# Patient Record
Sex: Female | Born: 1950 | Race: White | Hispanic: No | Marital: Married | Smoking: Never smoker
Health system: Southern US, Community
[De-identification: ages and names within clinical notes are randomized; demographics above are authoritative.]

## PROBLEM LIST (undated history)

## (undated) DIAGNOSIS — J411 Mucopurulent chronic bronchitis: Secondary | ICD-10-CM

## (undated) DIAGNOSIS — I213 ST elevation (STEMI) myocardial infarction of unspecified site: Secondary | ICD-10-CM

## (undated) DIAGNOSIS — E079 Disorder of thyroid, unspecified: Secondary | ICD-10-CM

## (undated) DIAGNOSIS — I2510 Atherosclerotic heart disease of native coronary artery without angina pectoris: Secondary | ICD-10-CM

## (undated) DIAGNOSIS — E785 Hyperlipidemia, unspecified: Secondary | ICD-10-CM

## (undated) DIAGNOSIS — R7303 Prediabetes: Secondary | ICD-10-CM

## (undated) DIAGNOSIS — E2740 Unspecified adrenocortical insufficiency: Secondary | ICD-10-CM

## (undated) HISTORY — DX: Mucopurulent chronic bronchitis: J41.1

## (undated) HISTORY — DX: Atherosclerotic heart disease of native coronary artery without angina pectoris: I25.10

---

## 2002-09-21 ENCOUNTER — Encounter: Payer: Self-pay | Admitting: General Practice

## 2002-09-21 ENCOUNTER — Encounter: Admission: RE | Admit: 2002-09-21 | Discharge: 2002-09-21 | Payer: Self-pay | Admitting: General Practice

## 2011-05-08 ENCOUNTER — Other Ambulatory Visit: Payer: Self-pay | Admitting: Obstetrics and Gynecology

## 2011-05-08 DIAGNOSIS — R928 Other abnormal and inconclusive findings on diagnostic imaging of breast: Secondary | ICD-10-CM

## 2011-05-15 ENCOUNTER — Ambulatory Visit
Admission: RE | Admit: 2011-05-15 | Discharge: 2011-05-15 | Disposition: A | Discharge status: Home / Self Care | Payer: BC Managed Care – PPO | Source: Ambulatory Visit | Attending: Obstetrics and Gynecology | Admitting: Obstetrics and Gynecology

## 2011-05-15 DIAGNOSIS — R928 Other abnormal and inconclusive findings on diagnostic imaging of breast: Secondary | ICD-10-CM

## 2011-11-13 DIAGNOSIS — R000 Tachycardia, unspecified: Secondary | ICD-10-CM

## 2013-04-15 DIAGNOSIS — G4733 Obstructive sleep apnea (adult) (pediatric): Secondary | ICD-10-CM

## 2013-04-15 HISTORY — DX: Obstructive sleep apnea (adult) (pediatric): G47.33

## 2015-09-03 ENCOUNTER — Encounter (HOSPITAL_COMMUNITY): Payer: Self-pay | Admitting: Emergency Medicine

## 2015-09-03 ENCOUNTER — Emergency Department (HOSPITAL_COMMUNITY)
Admission: EM | Admit: 2015-09-03 | Discharge: 2015-09-03 | Disposition: A | Discharge status: Home / Self Care | Payer: Medicare Other | Attending: Emergency Medicine | Admitting: Emergency Medicine

## 2015-09-03 ENCOUNTER — Emergency Department (HOSPITAL_COMMUNITY): Payer: Medicare Other

## 2015-09-03 DIAGNOSIS — J40 Bronchitis, not specified as acute or chronic: Secondary | ICD-10-CM | POA: Diagnosis not present

## 2015-09-03 DIAGNOSIS — R0602 Shortness of breath: Secondary | ICD-10-CM | POA: Diagnosis present

## 2015-09-03 DIAGNOSIS — Z79899 Other long term (current) drug therapy: Secondary | ICD-10-CM | POA: Diagnosis not present

## 2015-09-03 HISTORY — DX: Unspecified adrenocortical insufficiency: E27.40

## 2015-09-03 LAB — CBC
HCT: 40.4 % (ref 36.0–46.0)
HEMOGLOBIN: 13.8 g/dL (ref 12.0–15.0)
MCH: 30.1 pg (ref 26.0–34.0)
MCHC: 34.2 g/dL (ref 30.0–36.0)
MCV: 88 fL (ref 78.0–100.0)
PLATELETS: 258 10*3/uL (ref 150–400)
RBC: 4.59 MIL/uL (ref 3.87–5.11)
RDW: 13.2 % (ref 11.5–15.5)
WBC: 8.2 10*3/uL (ref 4.0–10.5)

## 2015-09-03 LAB — TROPONIN I

## 2015-09-03 LAB — COMPREHENSIVE METABOLIC PANEL
ALBUMIN: 4.4 g/dL (ref 3.5–5.0)
ALT: 34 U/L (ref 14–54)
ANION GAP: 7 (ref 5–15)
AST: 36 U/L (ref 15–41)
Alkaline Phosphatase: 68 U/L (ref 38–126)
BUN: 12 mg/dL (ref 6–20)
CO2: 23 mmol/L (ref 22–32)
Calcium: 9.5 mg/dL (ref 8.9–10.3)
Chloride: 107 mmol/L (ref 101–111)
Creatinine, Ser: 0.68 mg/dL (ref 0.44–1.00)
GFR calc non Af Amer: >60 mL/min (ref >60)
GLUCOSE: 132 mg/dL — AB (ref 65–99)
POTASSIUM: 3.7 mmol/L (ref 3.5–5.1)
SODIUM: 137 mmol/L (ref 135–145)
Total Bilirubin: 0.5 mg/dL (ref 0.3–1.2)
Total Protein: 7.8 g/dL (ref 6.5–8.1)

## 2015-09-03 LAB — DIFFERENTIAL
BASOS ABS: 0.1 10*3/uL (ref 0.0–0.1)
Basophils Relative: 1 %
Eosinophils Absolute: 0.2 10*3/uL (ref 0.0–0.7)
Eosinophils Relative: 2 %
LYMPHS PCT: 27 %
Lymphs Abs: 2.3 10*3/uL (ref 0.7–4.0)
MONO ABS: 0.9 10*3/uL (ref 0.1–1.0)
MONOS PCT: 11 %
NEUTROS ABS: 5 10*3/uL (ref 1.7–7.7)
Neutrophils Relative %: 59 %

## 2015-09-03 MED ORDER — ALBUTEROL SULFATE (2.5 MG/3ML) 0.083% IN NEBU
2.5000 mg | INHALATION_SOLUTION | Freq: Once | RESPIRATORY_TRACT | Status: AC
  Administered 2015-09-03: 2.5 mg via RESPIRATORY_TRACT
  Filled 2015-09-03: qty 3

## 2015-09-03 MED ORDER — IPRATROPIUM-ALBUTEROL 0.5-2.5 (3) MG/3ML IN SOLN
3.0000 mL | Freq: Once | RESPIRATORY_TRACT | Status: AC
  Administered 2015-09-03: 3 mL via RESPIRATORY_TRACT
  Filled 2015-09-03: qty 3

## 2015-09-03 NOTE — ED Notes (Signed)
Pt states she has not been feeling well for about 3 weeks.  States they went on vacation and after entering the room she found she was unable to breathe and inhaler was not working.  Has seen pcp since then, but still having symptoms.  States really got bad on Friday.  Was tx for URI with augmentin and mucinex 3 weeks ago.

## 2015-09-03 NOTE — ED Provider Notes (Signed)
CSN: [id number]     Arrival date & time 09/03/15  4098 History   First MD Initiated Contact with Patient 09/03/15 [phone]     Chief Complaint  Patient presents with  . Shortness of Breath     (Consider location/radiation/quality/duration/timing/severity/associated sxs/prior Treatment) HPI   Monica Harrison is a 65 y.o. female with hx of adrenal insufficiency , presents to the Emergency Department complaining of shortness of breath and generalized malaise for 3 weeks.  She states her symptoms began after [place] believes [place].  She reports persistent, intermittent coughing since them associated with generalized malaise.  She has been seen by her PMD and currently taking augmentin without relief.  Cough is non-productive.  She denies fever, vomiting, dysuria, abdominal pain.  Using albuterol nebs at home without relief.     Past Medical History  Diagnosis Date  . Adrenal insufficiency (HCC)    History reviewed. No pertinent past surgical history. History reviewed. No pertinent family history. Social History  Substance Use Topics  . Smoking status: Never Smoker   . Smokeless tobacco: None  . Alcohol Use: No   OB History    No data available     Review of Systems  Constitutional: Positive for fatigue. Negative for fever, chills and appetite change.  HENT: Positive for congestion. Negative for sore throat and trouble swallowing.   Respiratory: Positive for cough, chest tightness and shortness of breath. Negative for wheezing.   Cardiovascular: Negative for chest pain.  Gastrointestinal: Negative for nausea, vomiting and abdominal pain.  Genitourinary: Negative for dysuria.  Musculoskeletal: Negative for arthralgias.  Skin: Negative for rash.  Neurological: Negative for dizziness, weakness and numbness.  Hematological: Negative for adenopathy.  All other systems reviewed and are negative.     Allergies  Avelox and Biaxin  Home  Medications   Prior to Admission medications   Medication Sig Start Date End Date Taking? Authorizing Provider  ALPRAZolam Prudy Feeler) 0.25 MG tablet Take 0.25 mg by mouth at bedtime as needed for anxiety.   Yes Historical Provider, MD  hydrocortisone (CORTEF) 5 MG tablet Take 2.5-10 mg by mouth See admin instructions. TAKE 10 MG IN THE MORNING AND 2.5 MG IN THE AFTERNOON.   Yes Historical Provider, MD  loratadine (CLARITIN) 10 MG tablet Take 10 mg by mouth daily as needed for allergies.   Yes Historical Provider, MD  PRESCRIPTION MEDICATION Apply 1 application topically 3 (three) times a week. COMPOUNDED TESTOSTERONE CREAM - [facility].   Yes Historical Provider, MD  PRESCRIPTION MEDICATION Apply 1 application topically at bedtime. COMPOUNDED PROGESTERONE CREAM - [facility].   Yes Historical Provider, MD  thyroid (NATURE-THROID) 32.5 MG tablet Take 32.5 mg by mouth daily after lunch.   Yes Historical Provider, MD  Thyroid (NATURE-THROID) 48.75 MG TABS Take 1 tablet by mouth daily.   Yes Historical Provider, MD  ALREX 0.2 % SUSP Place 1 drop into both eyes daily as needed. 08/18/15   Historical Provider, MD  amoxicillin-clavulanate (AUGMENTIN) 875-125 MG tablet Take 1 tablet by mouth 2 (two) times daily. Starting 08/18/2015 x 20 days - treated for upper respiratory infection/sinuses. 08/18/15   Historical Provider, MD  fluconazole (DIFLUCAN) 150 MG tablet Take 1 tablet by mouth daily. Starting 08/18/2015 x 20 days. 08/18/15   Historical Provider, MD   BP 148/83 mmHg  Pulse 87  Temp(Src) 97.6 F (36.4 C) (Oral)  Resp 18  Ht  (1.6 m)  Wt 82.555 kg  BMI 32.25 kg/m2  SpO2 98% Physical Exam  Constitutional: She is oriented to person, place, and time. She appears well-developed and well-nourished. No distress.  HENT:  Head: Normocephalic and atraumatic.  Right Ear: Tympanic membrane and ear canal normal.  Left Ear: Tympanic membrane and ear canal normal.  Mouth/Throat: Uvula is midline,  oropharynx is clear and moist and mucous membranes are normal. No oropharyngeal exudate.  Eyes: EOM are normal. Pupils are equal, round, and reactive to light.  Neck: Normal range of motion, full passive range of motion without pain and phonation normal. Neck supple.  Cardiovascular: Normal rate, regular rhythm, normal heart sounds and intact distal pulses.   No murmur heard. Pulmonary/Chest: Effort normal and breath sounds normal. No stridor. No respiratory distress. She has no wheezes. She has no rales. She exhibits no tenderness.  Abdominal: Soft. She exhibits no distension. There is no tenderness. There is no rebound.  Musculoskeletal: She exhibits no edema.  Lymphadenopathy:    She has no cervical adenopathy.  Neurological: She is alert and oriented to person, place, and time. She exhibits normal muscle tone. Coordination normal.  Skin: Skin is warm and dry.  Psychiatric: She has a normal mood and affect.  Nursing note and vitals reviewed.   ED Course  Procedures (including critical care time) Labs Review Labs Reviewed  COMPREHENSIVE METABOLIC PANEL - Abnormal; Notable for the following:    Glucose, Bld 132 (*)    All other components within normal limits  CBC  DIFFERENTIAL  TROPONIN I    Imaging Review Dg Chest 2 View  09/03/2015  CLINICAL DATA:  Shortness of breath. EXAM: CHEST  2 VIEW COMPARISON:  None. FINDINGS: Normal heart size. Mildly tortuous atherosclerotic thoracic aorta. Otherwise normal mediastinal contour. No pneumothorax. No pleural effusion. No pulmonary edema. Mild scarring versus atelectasis at the left lung base. No consolidative airspace disease. IMPRESSION: Mild scarring versus atelectasis at the left lung base. Otherwise no active disease in the chest. Electronically Signed   By: Delbert PhenixJason A Poff M.D.   On: 09/03/2015 10:27   I have personally reviewed and evaluated these images and lab results as part of my medical decision-making.   EKG  Interpretation   Date/Time:  Sunday Sep 03 2015 09:37:21 EDT Ventricular Rate:  91 PR Interval:  151 QRS Duration: 100 QT Interval:  369 QTC Calculation: 454 R Axis:   -19 Text Interpretation:  Sinus rhythm Borderline left axis deviation Low  voltage, extremity and precordial leads No old tracing to compare  Confirmed by Uc RegentsWENTZ  MD, ELLIOTT ([id number]) on 09/03/2015 9:42:38 AM      MDM   Final diagnoses:  Bronchitis    Pt well appearing, non-toxic.  Currently taking Augmentin prescribed from her PMD.  Has appt on Monday with her pulmonologist.  Labs, EKG reassuring.    Pt also seen by Dr. Effie ShyWentz and care plan discussed with includes continue abx, inhaler, and she agrees to increase her hydrocortisone until pulm f/u. Advised to ER return if needed    Pauline Ausammy Pernella Ackerley, Cordelia Poche-C 09/05/15 2152  Mancel BaleElliott Wentz, MD 09/06/15 [id number]

## 2015-09-03 NOTE — ED Provider Notes (Signed)
  Face-to-face evaluation   History: She complains of malaise, an episode of shortness of breath, and feeling bad for several weeks. She is currently being treated with Augmentin, and is apparently on her second round of that. Her primary care doctor also increased her hydrocortisone for 4 days, about 10 days ago and that helped. She has a follow-up appointment with her pulmonologist tomorrow and with her endocrinologist in 2 days time   Physical exam: Alert, calm, cooperative. Able to speak in full sentences without distress. Lungs very minimally decreased air movement bilaterally without audible wheezes, Rales or rhonchi.  MDM- bronchitis without evidence for infection. No reason for admission or aggressive emergency department treatment at this time. She may benefit from a short-term increase of her steroid medication. She has follow-up with pulmonary tomorrow who can monitor her treatment.    Medical screening examination/treatment/procedure(s) were conducted as a shared visit with non-physician practitioner(s) and myself.  I personally evaluated the patient during the encounter  Mancel BaleElliott Kazuo Durnil, MD 09/06/15 1232

## 2015-09-14 DIAGNOSIS — J15212 Pneumonia due to Methicillin resistant Staphylococcus aureus: Secondary | ICD-10-CM

## 2015-09-14 HISTORY — DX: Pneumonia due to methicillin resistant Staphylococcus aureus: J15.212

## 2016-03-09 ENCOUNTER — Encounter (HOSPITAL_COMMUNITY): Payer: Self-pay | Admitting: *Deleted

## 2016-03-09 ENCOUNTER — Emergency Department (HOSPITAL_COMMUNITY): Payer: Medicare Other

## 2016-03-09 ENCOUNTER — Emergency Department (HOSPITAL_COMMUNITY)
Admission: EM | Admit: 2016-03-09 | Discharge: 2016-03-09 | Disposition: A | Discharge status: Home / Self Care | Payer: Medicare Other | Attending: Emergency Medicine | Admitting: Emergency Medicine

## 2016-03-09 DIAGNOSIS — R1030 Lower abdominal pain, unspecified: Secondary | ICD-10-CM | POA: Diagnosis present

## 2016-03-09 DIAGNOSIS — Z79899 Other long term (current) drug therapy: Secondary | ICD-10-CM | POA: Diagnosis not present

## 2016-03-09 DIAGNOSIS — K5732 Diverticulitis of large intestine without perforation or abscess without bleeding: Secondary | ICD-10-CM | POA: Diagnosis not present

## 2016-03-09 HISTORY — DX: Disorder of thyroid, unspecified: E07.9

## 2016-03-09 LAB — URINALYSIS, ROUTINE W REFLEX MICROSCOPIC
Bilirubin Urine: NEGATIVE
GLUCOSE, UA: NEGATIVE mg/dL
Hgb urine dipstick: NEGATIVE
KETONES UR: NEGATIVE mg/dL
LEUKOCYTES UA: NEGATIVE
NITRITE: NEGATIVE
PH: 7.5 (ref 5.0–8.0)
PROTEIN: NEGATIVE mg/dL
Specific Gravity, Urine: 1.01 (ref 1.005–1.030)

## 2016-03-09 LAB — CBC
HEMATOCRIT: 37.8 % (ref 36.0–46.0)
Hemoglobin: 13 g/dL (ref 12.0–15.0)
MCH: 30.6 pg (ref 26.0–34.0)
MCHC: 34.4 g/dL (ref 30.0–36.0)
MCV: 88.9 fL (ref 78.0–100.0)
PLATELETS: 241 10*3/uL (ref 150–400)
RBC: 4.25 MIL/uL (ref 3.87–5.11)
RDW: 12.9 % (ref 11.5–15.5)
WBC: 11.8 10*3/uL — AB (ref 4.0–10.5)

## 2016-03-09 LAB — COMPREHENSIVE METABOLIC PANEL
ALT: 27 U/L (ref 14–54)
AST: 32 U/L (ref 15–41)
Albumin: 4 g/dL (ref 3.5–5.0)
Alkaline Phosphatase: 61 U/L (ref 38–126)
Anion gap: 9 (ref 5–15)
BILIRUBIN TOTAL: 0.4 mg/dL (ref 0.3–1.2)
BUN: 17 mg/dL (ref 6–20)
CHLORIDE: 103 mmol/L (ref 101–111)
CO2: 25 mmol/L (ref 22–32)
Calcium: 9.7 mg/dL (ref 8.9–10.3)
Creatinine, Ser: 0.84 mg/dL (ref 0.44–1.00)
Glucose, Bld: 142 mg/dL — ABNORMAL HIGH (ref 65–99)
POTASSIUM: 3.7 mmol/L (ref 3.5–5.1)
Sodium: 137 mmol/L (ref 135–145)
TOTAL PROTEIN: 7.2 g/dL (ref 6.5–8.1)

## 2016-03-09 LAB — LIPASE, BLOOD: LIPASE: 18 U/L (ref 11–51)

## 2016-03-09 MED ORDER — AMOXICILLIN-POT CLAVULANATE 875-125 MG PO TABS: 1.0000 | ORAL_TABLET | Freq: Two times a day (BID) | ORAL | 0 refills | Status: DC

## 2016-03-09 MED ORDER — DEXTROSE 5 % IV SOLN
1.0000 g | Freq: Once | INTRAVENOUS | Status: AC
  Administered 2016-03-09: 1 g via INTRAVENOUS
  Filled 2016-03-09: qty 10

## 2016-03-09 MED ORDER — METRONIDAZOLE 500 MG PO TABS
500.0000 mg | ORAL_TABLET | Freq: Once | ORAL | Status: AC
  Administered 2016-03-09: 500 mg via ORAL
  Filled 2016-03-09: qty 1

## 2016-03-09 MED ORDER — IOPAMIDOL (ISOVUE-300) INJECTION 61%
100.0000 mL | Freq: Once | INTRAVENOUS | Status: AC | PRN
  Administered 2016-03-09: 100 mL via INTRAVENOUS

## 2016-03-09 NOTE — ED Notes (Signed)
Patient transported to CT 

## 2016-03-09 NOTE — ED Triage Notes (Signed)
Pt comes in for right sided flank pain that moves into her lower abdomen. This started on Wednesday. Pt states she has diarrhea, denies any n/v. Pt went to [facility] urgent care and was tested for a uti which was negative. She was sent here for r/o kidney stone.

## 2016-03-09 NOTE — ED Provider Notes (Signed)
[facility]-EMERGENCY DEPT Provider Note   CSN: [id number] Arrival date & time: 03/09/16  1308     History   Chief Complaint Chief Complaint  Patient presents with  . Flank Pain    HPI Monica Harrison is a 65 y.o. female.  HPI  The patient is a 64 year old female, she presents to the hospital with a complaint of lower abdominal pain which started several days ago. She reports that this pain is rather constant, it is worse at times, she thinks it is worse when she is walking or moving around, better when she is lying still, it does not come on sporadically. It does not come on randomly. She reports that there is no sharp or stabbing pain, she has some urinary frequency but no blood in the urine, no dysuria, she denies any fevers chills nausea or vomiting. She was seen at the urgent care prior to arrival, urinalysis was unremarkable so she was sent to the emergency department for evaluation for kidney stones. She has never had a kidney stone. She does report a history of a slightly prolapsed bladder but his never had any abdominal surgery. She also reports having some constipation, pitting and some magnesium oxide to try to move her bowels and reports that she has had several loose bowel movements but no blood and no watery stools. Having the stools does not make her feel better.  Past Medical History:  Diagnosis Date  . Adrenal insufficiency (HCC)   . Thyroid disease     There are no active problems to display for this patient.   History reviewed. No pertinent surgical history.  OB History    No data available       Home Medications    Prior to Admission medications   Medication Sig Start Date End Date Taking? Authorizing Provider  albuterol (PROVENTIL HFA;VENTOLIN HFA) 108 (90 Base) MCG/ACT inhaler Inhale 1-2 puffs into the lungs every 6 (six) hours as needed for wheezing or shortness of breath.   Yes Historical Provider, MD  albuterol (PROVENTIL) (2.5 MG/3ML) 0.083% nebulizer  solution Take 2.5 mg by nebulization every 6 (six) hours as needed for wheezing or shortness of breath.   Yes Historical Provider, MD  ALPRAZolam (XANAX) 0.25 MG tablet Take 0.25 mg by mouth at bedtime.    Yes Historical Provider, MD  B Complex Vitamins (B COMPLEX 50 PO) Take 1 tablet by mouth 3 (three) times a week.   Yes Historical Provider, MD  calcium-vitamin D (OSCAL WITH D) 500-200 MG-UNIT tablet Take 2 tablets by mouth daily with breakfast.   Yes Historical Provider, MD  Cholecalciferol (VITAMIN D3) 5000 units CAPS Take 1 capsule by mouth daily.   Yes Historical Provider, MD  Chromium-Cinnamon 100-500 MCG-MG CAPS Take 1 capsule by mouth daily.   Yes Historical Provider, MD  Coenzyme Q10 (CO Q 10) 100 MG CAPS Take 1 capsule by mouth daily.   Yes Historical Provider, MD  Digestive Enzymes (ENZYME DIGEST) CAPS Take 2 capsules by mouth daily.   Yes Historical Provider, MD  fluorometholone (FML) 0.1 % ophthalmic suspension Place 1 drop into both eyes 2 (two) times daily. 03/01/16  Yes Historical Provider, MD  hydrocortisone (CORTEF) 5 MG tablet Take 5-10 mg by mouth See admin instructions. TAKE 10 MG IN THE MORNING AND 5 MG IN THE AFTERNOON.   Yes Historical Provider, MD  Magnesium 500 MG TABS Take 1 tablet by mouth daily.   Yes Historical Provider, MD  Multiple Vitamin (MULTIVITAMIN WITH MINERALS) TABS tablet  Take 1 tablet by mouth daily.   Yes Historical Provider, MD  Probiotic Product (PROBIOTIC PO) Take 1 capsule by mouth daily.   Yes Historical Provider, MD  thyroid (NATURE-THROID) 32.5 MG tablet Take 32.5 mg by mouth daily after lunch.   Yes Historical Provider, MD  Thyroid (NATURE-THROID) 48.75 MG TABS Take 1 tablet by mouth daily.   Yes Historical Provider, MD  vitamin C (ASCORBIC ACID) 500 MG tablet Take 1,000 mg by mouth 4 (four) times daily.   Yes Historical Provider, MD  amoxicillin-clavulanate (AUGMENTIN) 875-125 MG tablet Take 1 tablet by mouth every 12 (twelve) hours. 03/09/16    Eber HongBrian Julus Kelley, MD  PRESCRIPTION MEDICATION Apply 1 application topically 2 (two) times a week. COMPOUNDED TESTOSTERONE CREAM - [facility].     Historical Provider, MD  PRESCRIPTION MEDICATION Apply 1 application topically at bedtime. COMPOUNDED PROGESTERONE CREAM - [facility].    Historical Provider, MD    Family History No family history on file.  Social History Social History  Substance Use Topics  . Smoking status: Never Smoker  . Smokeless tobacco: Never Used  . Alcohol use No     Allergies   Avelox [moxifloxacin hcl in nacl]; Biaxin [clarithromycin]; and Sulfa antibiotics   Review of Systems Review of Systems  All other systems reviewed and are negative.    Physical Exam Updated Vital Signs BP 106/56 (BP Location: Right Arm)   Pulse 77   Temp 98.4 F (36.9 C) (Oral)   Resp 16   Ht 5\' 3"  (1.6 m)   Wt 176 lb 3.2 oz (79.9 kg)   SpO2 96%   BMI 31.21 kg/m   Physical Exam  Constitutional: She appears well-developed and well-nourished. No distress.  HENT:  Head: Normocephalic and atraumatic.  Mouth/Throat: Oropharynx is clear and moist. No oropharyngeal exudate.  Eyes: Conjunctivae and EOM are normal. Pupils are equal, round, and reactive to light. Right eye exhibits no discharge. Left eye exhibits no discharge. No scleral icterus.  Neck: Normal range of motion. Neck supple. No JVD present. No thyromegaly present.  Cardiovascular: Normal rate, regular rhythm, normal heart sounds and intact distal pulses.  Exam reveals no gallop and no friction rub.   No murmur heard. Pulmonary/Chest: Effort normal and breath sounds normal. No respiratory distress. She has no wheezes. She has no rales.  Abdominal: Soft. Bowel sounds are normal. She exhibits no distension and no mass. There is tenderness ( Though the patient's abdomen is totally soft and benign she does express tenderness when I push in the suprapubic and left lower quadrants, there is no guarding or masses).    Musculoskeletal: Normal range of motion. She exhibits no edema or tenderness.  Lymphadenopathy:    She has no cervical adenopathy.  Neurological: She is alert. Coordination normal.  Skin: Skin is warm and dry. No rash noted. No erythema.  Psychiatric: She has a normal mood and affect. Her behavior is normal.  Nursing note and vitals reviewed.    ED Treatments / Results  Labs (all labs ordered are listed, but only abnormal results are displayed) Labs Reviewed  COMPREHENSIVE METABOLIC PANEL - Abnormal; Notable for the following:       Result Value   Glucose, Bld 142 (*)    All other components within normal limits  CBC - Abnormal; Notable for the following:    WBC 11.8 (*)    All other components within normal limits  LIPASE, BLOOD  URINALYSIS, ROUTINE W REFLEX MICROSCOPIC (NOT AT [facility])  EKG  EKG Interpretation None       Radiology Ct Abdomen Pelvis W Contrast  Result Date: 03/09/2016 CLINICAL DATA:  Right flank pain radiating into lower abdominal pain.x 3 days. Worse today. EXAM: CT ABDOMEN AND PELVIS WITH CONTRAST TECHNIQUE: Multidetector CT imaging of the abdomen and pelvis was performed using the standard protocol following bolus administration of intravenous contrast. CONTRAST:  100mL ISOVUE-300 IOPAMIDOL (ISOVUE-300) INJECTION 61% COMPARISON:  None. FINDINGS: Lower chest: No acute findings.  Small hiatal hernia. Hepatobiliary: 1.1 cm stone in the gallbladder neck region. Gallbladder otherwise unremarkable without evidence of acute cholecystitis. Liver appears normal. No bile duct dilatation appreciated. Pancreas: Unremarkable. No pancreatic ductal dilatation or surrounding inflammatory changes. Evaluation slightly limited by patient motion artifact. Spleen: Normal in size without focal abnormality. Adrenals/Urinary Tract: Adrenal glands appear normal. Small hypodense focus within the anterior cortex of the mid right kidney, too small to definitively characterize but most  likely a small cyst. Kidneys otherwise unremarkable without suspicious mass, stone or hydronephrosis. No perinephric inflammation. No ureteral or bladder calculi identified. Bladder appears normal. Stomach/Bowel: Fairly extensive diverticulosis within the sigmoid and descending colon. Focal inflammatory thickening of the colonic walls at the junction of the sigmoid colon and descending colon, with associated pericolonic inflammation, indicating acute diverticulitis. No large or small bowel dilatation. Small hiatal hernia. Appendix is not convincingly seen but there are no inflammatory changes about the cecum to suggest acute appendicitis. Vascular/Lymphatic: Scattered mild atherosclerotic changes of the normal caliber abdominal aorta. No acute vascular abnormality. No enlarged lymph nodes seen within the abdomen or pelvis. Reproductive: Unremarkable. Other: No abscess collection seen.  No free intraperitoneal air. Musculoskeletal: Degenerative changes throughout the slightly scoliotic thoracolumbar spine, mild to moderate in degree. Chronic bilateral pars interarticularis defects at the L5-S1 level with associated grade 1 spondylolisthesis. No acute appearing osseous abnormality. Superficial soft tissues are unremarkable. Incidental note made of a small umbilical hernia which contains fat only. IMPRESSION: 1. Acute diverticulitis at the junction of the sigmoid and descending colon, with associated bowel wall thickening and pericolonic inflammation. No complicating features. No abscess collection seen. No evidence of bowel perforation. No associated bowel obstruction. 2. Cholelithiasis without evidence of acute cholecystitis. 3. Small hiatal hernia. 4. Aortic atherosclerosis. 5. Chronic bilateral pars interarticularis defects at L5-S1 with associated grade 1 spondylolisthesis. No acute appearing osseous abnormality. Electronically Signed   By: Bary RichardStan  Maynard M.D.   On: 03/09/2016 17:32    Procedures Procedures  (including critical care time)  Medications Ordered in ED Medications  iopamidol (ISOVUE-300) 61 % injection 100 mL (100 mLs Intravenous Contrast Given 03/09/16 1643)  cefTRIAXone (ROCEPHIN) 1 g in dextrose 5 % 50 mL IVPB (1 g Intravenous New Bag/Given 03/09/16 1810)  metroNIDAZOLE (FLAGYL) tablet 500 mg (500 mg Oral Given 03/09/16 1810)     Initial Impression / Assessment and Plan / ED Course  I have reviewed the triage vital signs and the nursing notes.  Pertinent labs & imaging results that were available during my care of the patient were reviewed by me and considered in my medical decision making (see chart for details).  Clinical Course     Other than the abdominal tenderness the patient's exam is benign, she has a leukocytosis of 11,800, there is no other abnormal findings including a clean urinalysis and normal kidney function. We'll proceed with CT scan for further evaluation of possible abdominal pathologic process.  CT shows diverticulitis - uncomplicated - pt informed Given meds prior to d/c  home on  augmentin given cipro allergy Pt in agreement  Vitals:   03/09/16 1318 03/09/16 1751  BP: 121/66 106/56  Pulse: 99 77  Resp: 16 16  Temp: 98.4 F (36.9 C)     Final Clinical Impressions(s) / ED Diagnoses   Final diagnoses:  Diverticulitis of large intestine without perforation or abscess without bleeding    New Prescriptions New Prescriptions   AMOXICILLIN-CLAVULANATE (AUGMENTIN) 875-125 MG TABLET    Take 1 tablet by mouth every 12 (twelve) hours.     Eber Hong, MD 03/09/16 [phone]

## 2016-03-09 NOTE — Discharge Instructions (Signed)

## 2016-04-23 ENCOUNTER — Encounter (INDEPENDENT_AMBULATORY_CARE_PROVIDER_SITE_OTHER): Payer: Self-pay | Admitting: *Deleted

## 2018-04-21 ENCOUNTER — Ambulatory Visit (INDEPENDENT_AMBULATORY_CARE_PROVIDER_SITE_OTHER): Payer: Self-pay

## 2018-04-21 DIAGNOSIS — Z1211 Encounter for screening for malignant neoplasm of colon: Secondary | ICD-10-CM

## 2018-04-21 MED ORDER — NA SULFATE-K SULFATE-MG SULF 17.5-3.13-1.6 GM/177ML PO SOLN: 1.0000 | ORAL | 0 refills | Status: DC

## 2018-04-21 NOTE — Patient Instructions (Addendum)
Monica Harrison  16-Jul-1950 MRN: [id number]     Procedure Date: 12/29/2018 Time to register: 9:30am Place to register: [facility] Procedure Time: 10:30am Scheduled provider: R. Garfield Cornea, MD    PREPARATION FOR COLONOSCOPY WITH SUPREP BOWEL PREP KIT  Note: Suprep Bowel Prep Kit is a split-dose (2day) regimen. Consumption of BOTH 6-ounce bottles is required for a complete prep.  Please notify us immediately if you are diabetic, take iron supplements, or if you are on Coumadin or any other blood thinners.                                                                                                                                                   2 DAYS BEFORE PROCEDURE:  DATE: 12/27/2018 DAY: Sunday Begin clear liquid diet AFTER your lunch meal. NO SOLID FOODS after this point.  1 DAY BEFORE PROCEDURE:  DATE: 12/28/2018  DAY: Monday Continue clear liquids the entire day - NO SOLID FOOD.   At 6:00pm: Complete steps 1 through 4 below, using ONE (1) 6-ounce bottle, before going to bed. Step 1:  Pour ONE (1) 6-ounce bottle of SUPREP liquid into the mixing container.  Step 2:  Add cool drinking water to the 16 ounce line on the container and mix.  Note: Dilute the solution concentrate as directed prior to use. Step 3:  DRINK ALL the liquid in the container. Step 4:  You MUST drink an additional two (2) or more 16 ounce containers of water over the next one (1) hour.   Continue clear liquids.  DAY OF PROCEDURE:   DATE: 12/29/2018   DAY: Tuesday If you take medications for your heart, blood pressure, or breathing, you may take these medications.    5 hours before your procedure at :5:30am Step 1:  Pour ONE (1) 6-ounce bottle of SUPREP liquid into the mixing container.  Step 2:  Add cool drinking water to the 16 ounce line on the container and mix.  Note: Dilute the solution concentrate as directed prior to use. Step 3:  DRINK ALL the liquid in the container. Step 4:  You  MUST drink an additional two (2) or more 16 ounce containers of water over the next one (1) hour. You MUST complete the final glass of water at least 3 hours before your colonoscopy.   Nothing by mouth past 7:30am  You may take your morning medications with sip of water unless we have instructed otherwise.    Please see below for Dietary Information.  CLEAR LIQUIDS INCLUDE:  Water Jello (NOT red in color)   Ice Popsicles (NOT red in color)   Tea (sugar ok, no milk/cream) Powdered fruit flavored drinks  Coffee (sugar ok, no milk/cream) Gatorade/ Lemonade/ Kool-Aid  (NOT red in color)   Juice: apple, white grape, white cranberry Soft drinks  Clear bullion,  consomme, broth (fat free beef/chicken/vegetable)  Carbonated beverages (any kind)  Strained chicken noodle soup Hard Candy   Remember: Clear liquids are liquids that will allow you to see your fingers on the other side of a clear glass. Be sure liquids are NOT red in color, and not cloudy, but CLEAR.  DO NOT EAT OR DRINK ANY OF THE FOLLOWING:  Dairy products of any kind   Cranberry juice Tomato juice / V8 juice   Grapefruit juice Orange juice     Red grape juice  Do not eat any solid foods, including such foods as: cereal, oatmeal, yogurt, fruits, vegetables, creamed soups, eggs, bread, crackers, pureed foods in a blender, etc.   HELPFUL HINTS FOR DRINKING PREP SOLUTION:   Make sure prep is extremely cold. Mix and refrigerate the the morning of the prep. You may also put in the freezer.   You may try mixing some Crystal Light or Country Time Lemonade if you prefer. Mix in small amounts; add more if necessary.  Try drinking through a straw  Rinse mouth with water or a mouthwash between glasses, to remove after-taste.  Try sipping on a cold beverage /ice/ popsicles between glasses of prep.  Place a piece of sugar-free hard candy in mouth between glasses.  If you become nauseated, try consuming smaller amounts, or stretch out  the time between glasses. Stop for 30-60 minutes, then slowly start back drinking.     OTHER INSTRUCTIONS  You will need a responsible adult at least 69 years of age to accompany you and drive you home. This person must remain in the waiting room during your procedure. The hospital will cancel your procedure if you do not have a responsible adult with you.   1. Wear loose fitting clothing that is easily removed. 2. Leave jewelry and other valuables at home.  3. Remove all body piercing jewelry and leave at home. 4. Total time from sign-in until discharge is approximately 2-3 hours. 5. You should go home directly after your procedure and rest. You can resume normal activities the day after your procedure. 6. The day of your procedure you should not:  Drive  Make legal decisions  Operate machinery  Drink alcohol  Return to work   You may call the office (Dept: [phone]) before 5:00pm, or page the doctor on call [phone]) after 5:00pm, for further instructions, if necessary.   Insurance Information YOU WILL NEED TO CHECK WITH YOUR INSURANCE COMPANY FOR THE BENEFITS OF COVERAGE YOU HAVE FOR THIS PROCEDURE.  UNFORTUNATELY, NOT ALL INSURANCE COMPANIES HAVE BENEFITS TO COVER ALL OR PART OF THESE TYPES OF PROCEDURES.  IT IS YOUR RESPONSIBILITY TO CHECK YOUR BENEFITS, HOWEVER, WE WILL BE GLAD TO ASSIST YOU WITH ANY CODES YOUR INSURANCE COMPANY MAY NEED.    PLEASE NOTE THAT MOST INSURANCE COMPANIES WILL NOT COVER A SCREENING COLONOSCOPY FOR PEOPLE UNDER THE AGE OF 50  IF YOU HAVE BCBS INSURANCE, YOU MAY HAVE BENEFITS FOR A SCREENING COLONOSCOPY BUT IF POLYPS ARE FOUND THE DIAGNOSIS WILL CHANGE AND THEN YOU MAY HAVE A DEDUCTIBLE THAT WILL NEED TO BE MET. SO PLEASE MAKE SURE YOU CHECK YOUR BENEFITS FOR A SCREENING COLONOSCOPY AS WELL AS A DIAGNOSTIC COLONOSCOPY.

## 2018-04-21 NOTE — Progress Notes (Signed)
Gastroenterology Pre-Procedure Review  Request Date:04/21/18 Requesting Physician: Dr.Oneil no previous tcs  PATIENT REVIEW QUESTIONS: The patient responded to the following health history questions as indicated:    1. Diabetes Melitis: no 2. Joint replacements in the past 12 months: no 3. Major health problems in the past 3 months: no 4. Has an artificial valve or MVP: no 5. Has a defibrillator: no 6. Has been advised in past to take antibiotics in advance of a procedure like teeth cleaning: no 7. Family history of colon cancer: no  8. Alcohol Use: no 9. History of sleep apnea: no  10. History of coronary artery or other vascular stents placed within the last 12 months: no 11. History of any prior anesthesia complications: no    MEDICATIONS & ALLERGIES:    Patient reports the following regarding taking any blood thinners:   Plavix? no Aspirin? no Coumadin? no Brilinta? no Xarelto? no Eliquis? no Pradaxa? no Savaysa? no Effient? no  Patient confirms/reports the following medications:  Current Outpatient Medications  Medication Sig Dispense Refill  . albuterol (PROVENTIL HFA;VENTOLIN HFA) 108 (90 Base) MCG/ACT inhaler Inhale 1-2 puffs into the lungs every 6 (six) hours as needed for wheezing or shortness of breath.    Marland Kitchen. albuterol (PROVENTIL) (2.5 MG/3ML) 0.083% nebulizer solution Take 2.5 mg by nebulization every 6 (six) hours as needed for wheezing or shortness of breath.    . ALPRAZolam (XANAX) 0.25 MG tablet Take 0.25 mg by mouth at bedtime.     . B Complex Vitamins (B COMPLEX 50 PO) Take 1 tablet by mouth 3 (three) times a week.    . calcium-vitamin D (OSCAL WITH D) 500-200 MG-UNIT tablet Take 2 tablets by mouth daily with breakfast.    . Cholecalciferol (VITAMIN D3) 5000 units CAPS Take 1 capsule by mouth daily.    . Chromium-Cinnamon 100-500 MCG-MG CAPS Take 1 capsule by mouth daily.    . Coenzyme Q10 (CO Q 10) 100 MG CAPS Take 1 capsule by mouth daily.    . Digestive  Enzymes (ENZYME DIGEST) CAPS Take 2 capsules by mouth daily.    Marland Kitchen. ESTRADIOL-PROGESTERONE PO Take 1 mg by mouth. Takes 1/2 tablet    . hydrocortisone (CORTEF) 5 MG tablet Take 5-10 mg by mouth See admin instructions. TAKE 10 MG IN THE MORNING AND 5 MG IN THE AFTERNOON.    . Magnesium 500 MG TABS Take 1 tablet by mouth daily.    . Multiple Vitamin (MULTIVITAMIN WITH MINERALS) TABS tablet Take 1 tablet by mouth daily.    Bertram Gala. Polyethyl Glycol-Propyl Glycol (SYSTANE) 0.4-0.3 % SOLN Apply to eye daily. As needed    . PRESCRIPTION MEDICATION Apply 1 application topically 2 (two) times a week. COMPOUNDED TESTOSTERONE CREAM - [facility].     Marland Kitchen. PRESCRIPTION MEDICATION Apply 1 application topically at bedtime. COMPOUNDED PROGESTERONE CREAM - [facility].    . Probiotic Product (PROBIOTIC PO) Take 1 capsule by mouth daily.    Marland Kitchen. thyroid (NATURE-THROID) 32.5 MG tablet Take 32.5 mg by mouth daily after lunch.    . Thyroid (NATURE-THROID) 48.75 MG TABS Take 1 tablet by mouth daily.    Marland Kitchen. UNABLE TO FIND Med Name: live Immune gummies 3 daily    . vitamin A 1610910000 UNIT capsule Take 10,000 Units by mouth daily.    . vitamin C (ASCORBIC ACID) 500 MG tablet Take 1,000 mg by mouth 4 (four) times daily.     No current facility-administered medications for this visit.  Patient confirms/reports the following allergies:  Allergies  Allergen Reactions  . Avelox [Moxifloxacin Hcl In Nacl] Anaphylaxis  . Biaxin [Clarithromycin] Anaphylaxis  . Sulfa Antibiotics Other (See Comments)    Fatigue     No orders of the defined types were placed in this encounter.   AUTHORIZATION INFORMATION Primary Insurance: medicare,  ID #: [id number] Pre-Cert / Auth required: no  SCHEDULE INFORMATION: Procedure has been scheduled as follows:  Date:07/01/18 , Time: 12:00 Location: [facility] Dr.Rourk  This Gastroenterology Pre-Precedure Review Form is being routed to the following provider(s): Tana Coast, PA

## 2018-04-25 NOTE — Progress Notes (Signed)
Ok to schedule.

## 2018-06-24 ENCOUNTER — Telehealth: Payer: Self-pay | Admitting: Internal Medicine

## 2018-06-24 NOTE — Telephone Encounter (Signed)
Tried to call the pt- NA-LM

## 2018-06-24 NOTE — Telephone Encounter (Signed)
[phone] PLEASE CALL PATIENT ABOUT HER MEDICATION AND HER PROCEDURE [phone] CELL

## 2018-06-25 ENCOUNTER — Telehealth: Payer: Self-pay | Admitting: Gastroenterology

## 2018-06-25 NOTE — Telephone Encounter (Signed)
Pt was returning a call from yesterday from JL. [phone]

## 2018-06-25 NOTE — Progress Notes (Signed)
Pt called to rs.  See phone note from 06/25/2018.

## 2018-06-25 NOTE — Telephone Encounter (Signed)
Angie spoke with the pt. See other phone note.

## 2018-06-25 NOTE — Telephone Encounter (Signed)
Pt has sinus infection and is on Augmentin.  Pt has no fever but is not feeling well and wanted to reschedule her colonoscopy.  RSC to 09/16/2018.  New instructions mailed to pt.  Called and informed Endo.

## 2018-09-03 NOTE — Progress Notes (Signed)
Pt requested to re-schedule colonoscopy on 09/16/2018.  She is having eye surgery.  Pt is aware that we will call her back to re-schedule once August procedure schedules have been released.

## 2018-09-14 NOTE — Addendum Note (Signed)
Addended by: Noreene Larsson on: 09/14/2018 08:38 AM   Modules accepted: Orders, SmartSet

## 2018-09-14 NOTE — Progress Notes (Signed)
Pt re-scheduled to 12/01/2018.  Pt aware that we are mailing out new prep instructions.  New orders placed in Epic.

## 2018-09-16 ENCOUNTER — Ambulatory Visit (HOSPITAL_COMMUNITY): Admission: RE | Admit: 2018-09-16 | Payer: Medicare Other | Source: Home / Self Care | Admitting: Internal Medicine

## 2018-09-16 ENCOUNTER — Encounter (HOSPITAL_COMMUNITY): Admission: RE | Payer: Self-pay | Source: Home / Self Care

## 2018-09-16 SURGERY — COLONOSCOPY
Anesthesia: Moderate Sedation

## 2018-10-13 ENCOUNTER — Telehealth: Payer: Self-pay | Admitting: *Deleted

## 2018-10-13 NOTE — Telephone Encounter (Signed)
Called pt to schedule COVID 19 screening.  Pt decided to re-schedule her procedure to 12/29/2018.  She scheduled it out to Sept because she said that  her ENT doctor told her to stay away from the hospital for anything right now due to her adrenal glands.  Pt aware that we will mail out new prep instructions.  Pt aware of COVID 19 screening date on 12/25/2018.  Endo notified.

## 2018-10-13 NOTE — Addendum Note (Signed)
Addended by: Metro Kung on: 10/13/2018 04:05 PM   Modules accepted: Miquel Dunn

## 2018-11-14 DIAGNOSIS — U071 COVID-19: Secondary | ICD-10-CM

## 2018-11-14 DIAGNOSIS — J1282 Pneumonia due to coronavirus disease 2019: Secondary | ICD-10-CM

## 2018-11-14 HISTORY — DX: Pneumonia due to coronavirus disease 2019: J12.82

## 2018-11-14 HISTORY — DX: COVID-19: U07.1

## 2018-12-24 ENCOUNTER — Telehealth: Payer: Self-pay | Admitting: *Deleted

## 2018-12-24 NOTE — Telephone Encounter (Signed)
Pt called in and requested to cancel her procedure.  She did not want to reschedule.  She said that Dr. Wynona Meals has advised her not to have the procedure done right now due to her health and immune system.  She is aware to have her doctor to send a referral to [place] when she is cleared to have it done.  Pt voiced understanding.

## 2018-12-25 ENCOUNTER — Other Ambulatory Visit (HOSPITAL_COMMUNITY)
Admission: RE | Admit: 2018-12-25 | Discharge: 2018-12-25 | Disposition: A | Discharge status: Home / Self Care | Payer: Medicare Other | Source: Ambulatory Visit | Attending: Internal Medicine | Admitting: Internal Medicine

## 2018-12-29 ENCOUNTER — Encounter (HOSPITAL_COMMUNITY): Admission: RE | Payer: Self-pay | Source: Home / Self Care

## 2018-12-29 ENCOUNTER — Ambulatory Visit (HOSPITAL_COMMUNITY): Admission: RE | Admit: 2018-12-29 | Payer: Medicare Other | Source: Home / Self Care | Admitting: Internal Medicine

## 2018-12-29 SURGERY — COLONOSCOPY
Anesthesia: Moderate Sedation

## 2018-12-29 NOTE — Telephone Encounter (Signed)
Noted. Was screening exam. Await for future referral.

## 2020-05-03 ENCOUNTER — Encounter: Payer: Self-pay | Admitting: Pulmonary Disease

## 2020-05-03 ENCOUNTER — Ambulatory Visit (INDEPENDENT_AMBULATORY_CARE_PROVIDER_SITE_OTHER): Payer: Medicare Other

## 2020-05-03 ENCOUNTER — Ambulatory Visit (INDEPENDENT_AMBULATORY_CARE_PROVIDER_SITE_OTHER): Payer: Medicare Other | Admitting: Pulmonary Disease

## 2020-05-03 ENCOUNTER — Other Ambulatory Visit: Payer: Self-pay

## 2020-05-03 VITALS — BP 134/76 | HR 91 | Temp 97.3°F | Ht 63.0 in | Wt 184.4 lb

## 2020-05-03 DIAGNOSIS — R0600 Dyspnea, unspecified: Secondary | ICD-10-CM

## 2020-05-03 NOTE — Progress Notes (Signed)
Nikiyah Fackler    [id number]    02-04-1951  Primary Care Physician:Desai, Vito Berger, MD  Referring Physician: Kennieth Rad, MD [facility] [address] [place] [address]  Chief complaint: Consult for dyspnea  HPI: 70 year old with history of allergies, bronchitis, adrenal insufficiency [treated in [place]], Lyme's disease, angioedema Previously followed by Dr. Audie Box in [place] who has retired.  She has history of recurrent bronchitis, developed MRSA, E. coli pneumonia diagnosed with bronchoscopy in June 2017.  This was treated as an outpatient Has occasional seasonal allergies and is on albuterol which she uses occasionally. Has occasional snoring, especially when she is fatigued.  She was tested with a sleep apnea around 2015 and was told that she had mild obstructive events only when she was lying supine.  Currently not on treatment with CPAP  Pets: Dog Occupation: Operates a Medical sales representative shop Exposures: No mold, hot tub, Jacuzzi.  No feather pillows or comforter Smoking history: Never smoker Travel history: No significant travel history Relevant family history: No significant family history of lung disease  Outpatient Encounter Medications as of 05/03/2020  Medication Sig  . albuterol (PROVENTIL HFA;VENTOLIN HFA) 108 (90 Base) MCG/ACT inhaler Inhale 2 puffs into the lungs every 6 (six) hours as needed for wheezing or shortness of breath.   Marland Kitchen albuterol (PROVENTIL) (2.5 MG/3ML) 0.083% nebulizer solution Take 2.5 mg by nebulization every 6 (six) hours as needed for wheezing or shortness of breath.  . ALPRAZolam (XANAX) 0.25 MG tablet Take 0.25 mg by mouth at bedtime.  . Ascorbic Acid (VITAMIN C) 1000 MG tablet Take 2,000 mg by mouth 2 (two) times daily.  . B Complex Vitamins (B COMPLEX 50 PO) Take 1 tablet by mouth daily.   . benzonatate (TESSALON) 200 MG capsule benzonatate 200 mg capsule  . budesonide (RHINOCORT AQUA) 32 MCG/ACT nasal  spray Place 1 spray into both nostrils daily as needed for rhinitis.  . Calcium-Magnesium-Zinc (CAL-MAG-ZINC PO) Take 2 tablets by mouth at bedtime.  . Cholecalciferol (VITAMIN D3) 5000 units CAPS Take 5,000 Units by mouth at bedtime.   . Coenzyme Q10 (CO Q 10) 100 MG CAPS Take 100 mg by mouth every evening.   . Digestive Enzymes (ENZYME DIGEST) CAPS Take 1 capsule by mouth 2 (two) times daily with a meal.   . estradiol (ESTRACE) 1 MG tablet Take 0.5 mg by mouth every other day.  . hydrocortisone (CORTEF) 5 MG tablet Take 5-15 mg by mouth See admin instructions. TAKE 15 MG IN THE MORNING AND 5 MG IN THE AFTERNOON.  Marland Kitchen ibuprofen (ADVIL,MOTRIN) 200 MG tablet Take 400 mg by mouth every 6 (six) hours as needed for moderate pain.  . Magnesium 400 MG TABS Take 400 mg by mouth at bedtime.  . Misc Natural Products (IMMUNE FORMULA PO) Take 1 tablet by mouth 4 (four) times a week.  . Multiple Vitamin (MULTIVITAMIN WITH MINERALS) TABS tablet Take 3 tablets by mouth daily.   Marland Kitchen nystatin (MYCOSTATIN) 500000 units TABS tablet Take 500,000 Units by mouth at bedtime.  . Omega-3 Fatty Acids (OMEGA 3 PO) Take 2 capsules by mouth 2 (two) times daily.  Vladimir Faster Glycol-Propyl Glycol 0.4-0.3 % SOLN Place 1 drop into both eyes 4 (four) times daily.   Marland Kitchen PRESCRIPTION MEDICATION Apply 1 Pump topically at bedtime. COMPOUNDED PROGESTERONE CREAM - [facility].  . Probiotic Product (PROBIOTIC PO) Take 1 capsule by mouth every evening.   . thyroid (ARMOUR) 32.5 MG tablet  Take 32.5 mg by mouth daily at 2 PM.   . Thyroid 48.75 MG TABS Take 48.75 tablets by mouth daily.   . vitamin A 10000 UNIT capsule Take 10,000 Units by mouth every evening.   Marland Kitchen PRESCRIPTION MEDICATION Apply 1 application topically 2 (two) times a week. COMPOUNDED TESTOSTERONE CREAM - [facility].  Monday and Fridays (Patient not taking: Reported on 05/03/2020)  . [DISCONTINUED] amoxicillin-clavulanate (AUGMENTIN) 875-125 MG tablet Take 1 tablet by mouth 2  (two) times daily.  . [DISCONTINUED] Na Sulfate-K Sulfate-Mg Sulf (SUPREP BOWEL PREP KIT) 17.5-3.13-1.6 GM/177ML SOLN Take 1 kit by mouth as directed.   No facility-administered encounter medications on file as of 05/03/2020.    Allergies as of 05/03/2020 - Review Complete 05/03/2020  Allergen Reaction Noted  . Avelox [moxifloxacin hcl in nacl] Anaphylaxis 09/03/2015  . Biaxin [clarithromycin] Anaphylaxis 09/03/2015  . Lactose intolerance (gi)  06/24/2018  . Sulfa antibiotics Other (See Comments) 03/09/2016  . Tobramycin  06/24/2018  . Yeast-related products Swelling 06/24/2018    Past Medical History:  Diagnosis Date  . Adrenal insufficiency ([place])   . Thyroid disease     No past surgical history on file.  No family history on file.  Social History   Socioeconomic History  . Marital status: Married    Spouse name: Not on file  . Number of children: Not on file  . Years of education: Not on file  . Highest education level: Not on file  Occupational History  . Not on file  Tobacco Use  . Smoking status: Never Smoker  . Smokeless tobacco: Never Used  Substance and Sexual Activity  . Alcohol use: No  . Drug use: No  . Sexual activity: Not on file  Other Topics Concern  . Not on file  Social History Narrative  . Not on file   Social Determinants of Health   Financial Resource Strain: Not on file  Food Insecurity: Not on file  Transportation Needs: Not on file  Physical Activity: Not on file  Stress: Not on file  Social Connections: Not on file  Intimate Partner Violence: Not on file    Review of systems: Review of Systems  Constitutional: Negative for fever and chills.  HENT: Negative.   Eyes: Negative for blurred vision.  Respiratory: as per HPI  Cardiovascular: Negative for chest pain and palpitations.  Gastrointestinal: Negative for vomiting, diarrhea, blood per rectum. Genitourinary: Negative for dysuria, urgency, frequency and hematuria.   Musculoskeletal: Negative for myalgias, back pain and joint pain.  Skin: Negative for itching and rash.  Neurological: Negative for dizziness, tremors, focal weakness, seizures and loss of consciousness.  Endo/Heme/Allergies: Negative for environmental allergies.  Psychiatric/Behavioral: Negative for depression, suicidal ideas and hallucinations.  All other systems reviewed and are negative.  Physical Exam: Blood pressure 134/76, pulse 91, temperature (!) 97.3 F (36.3 C), temperature source Skin, height '5\' 3"'  (1.6 m), weight 184 lb 6.4 oz (83.6 kg), SpO2 99 %. Gen:      No acute distress HEENT:  EOMI, sclera anicteric Neck:     No masses; no thyromegaly Lungs:    Clear to auscultation bilaterally; normal respiratory effort CV:         Regular rate and rhythm; no murmurs Abd:      + bowel sounds; soft, non-tender; no palpable masses, no distension Ext:    No edema; adequate peripheral perfusion Skin:      Warm and dry; no rash Neuro: alert and oriented x 3 Psych: normal  mood and affect  Data Reviewed: Imaging: Chest x-ray 09/03/2015- mild atelectasis at the left base.  I have reviewed the images personally.  PFTs: 09/26/2015 FVC 3.40 [116%], FEV1 2.64 [125%], F/F 78 Normal spirometry  Labs:  Assessment:  Bronchitis She has a history of asthma, COPD on record but no objective evidence of obstruction on PFTs Occasionally needs to use albuterol inhaler, not on controller medication  Chest x-ray today, PFTs for baseline assessment  Suspected sleep apnea Has snoring and daytime somnolence and fatigue. Discussed home sleep study but she like to hold off for now as she is planning on getting some dental procedures done Reassess in 3 months.  Plan/Recommendations: Chest x-ray, PFTs  Marshell Garfinkel MD [facility] 05/03/2020, 10:46 AM  CC: Kennieth Rad, MD

## 2020-05-03 NOTE — Patient Instructions (Signed)
We are glad to establish care with you to continue pulmonary follow up in [place].  I have reviewed your records and will continue the albuterol as needed Schedule chest x-ray today and PFTs Follow-up in 3 months when we can discuss if you want to proceed with the sleep study.

## 2020-05-08 ENCOUNTER — Ambulatory Visit: Payer: Medicare Other

## 2020-05-08 ENCOUNTER — Other Ambulatory Visit: Payer: Self-pay | Admitting: *Deleted

## 2020-05-08 DIAGNOSIS — R0600 Dyspnea, unspecified: Secondary | ICD-10-CM

## 2020-05-16 ENCOUNTER — Ambulatory Visit (INDEPENDENT_AMBULATORY_CARE_PROVIDER_SITE_OTHER): Payer: Medicare Other

## 2020-05-16 DIAGNOSIS — R0600 Dyspnea, unspecified: Secondary | ICD-10-CM | POA: Diagnosis not present

## 2020-05-22 ENCOUNTER — Telehealth: Payer: Self-pay | Admitting: Pulmonary Disease

## 2020-05-22 DIAGNOSIS — R9389 Abnormal findings on diagnostic imaging of other specified body structures: Secondary | ICD-10-CM

## 2020-05-22 NOTE — Telephone Encounter (Signed)
ATC patient but she did not answer. VM is not setup. Will try again later.

## 2020-05-22 NOTE — Telephone Encounter (Signed)
Her chest x ray results from 2/1 did not come to my inbox for some reason  Please let her know that they continue to show opacities at the base of the lung. Advise her to get COVID tested.  Order z pack and high resolution CT in 2 weels for follow up imaging.

## 2020-05-24 MED ORDER — AMOXICILLIN-POT CLAVULANATE 875-125 MG PO TABS: 1.0000 | ORAL_TABLET | Freq: Two times a day (BID) | ORAL | 0 refills | Status: DC

## 2020-05-24 MED ORDER — FLUCONAZOLE 100 MG PO TABS: 100.0000 mg | ORAL_TABLET | Freq: Every day | ORAL | 0 refills | Status: DC

## 2020-05-24 NOTE — Telephone Encounter (Signed)
Okay to send in Augmentin 875 mg twice daily for 7 days Send in Diflucan 100 mg once daily for 7 days  Let her know that the opacity is at both bases more on the right.  Since this is a persistent issue order follow-up high-resolution CT in 4 weeks for follow-up.  Chilton Greathouse MD [facility] See Amion for pager  If no response to pager , please call [phone] until 7pm After 7:00 pm call Elink  [phone] 05/24/2020, 2:21 PM

## 2020-05-24 NOTE — Telephone Encounter (Signed)
Spoke with patient. She is aware of Dr. Shirlee More recommendations. Will go ahead and place the order for the Augmentin, Diflucan and CT scan.   Nothing further needed at time of call.

## 2020-05-24 NOTE — Telephone Encounter (Signed)
Pt stated that she is not able to take zpak.  She said that the only abx that she can take is augmentin. Pt is wanting to know if this is on the right side? She stated that Dr. Flora Harrison saw something on her cxr back in November but said he was not concerned about this.   Please advise. Thanks   Pt stated that she will also need diflucan is she is to take the abx.   Pt also stated that she has not had any symptoms of covid.

## 2020-05-25 ENCOUNTER — Other Ambulatory Visit: Payer: Medicare Other

## 2020-05-25 DIAGNOSIS — Z20822 Contact with and (suspected) exposure to covid-19: Secondary | ICD-10-CM

## 2020-05-26 LAB — SARS-COV-2, NAA 2 DAY TAT

## 2020-05-26 LAB — NOVEL CORONAVIRUS, NAA: SARS-CoV-2, NAA: NOT DETECTED

## 2020-06-21 ENCOUNTER — Other Ambulatory Visit: Payer: Self-pay

## 2020-06-21 ENCOUNTER — Ambulatory Visit (HOSPITAL_COMMUNITY)
Admission: RE | Admit: 2020-06-21 | Discharge: 2020-06-21 | Disposition: A | Discharge status: Home / Self Care | Payer: Medicare Other | Source: Ambulatory Visit | Attending: Pulmonary Disease | Admitting: Pulmonary Disease

## 2020-06-21 DIAGNOSIS — R9389 Abnormal findings on diagnostic imaging of other specified body structures: Secondary | ICD-10-CM | POA: Insufficient documentation

## 2020-06-23 ENCOUNTER — Telehealth: Payer: Self-pay | Admitting: Pulmonary Disease

## 2020-06-23 NOTE — Telephone Encounter (Signed)
Please let her know that CT yesterday does not show any pneumonia.  I do not think she will require antibiotics Please advise her to get tested for Covid Advise saline nasal rinse, over-the-counter steroid nasal spray

## 2020-06-23 NOTE — Telephone Encounter (Signed)
Spoke with the pt  She states started to have frequent throat clearing 2-3 days ago  Today her sinuses feel congested and she has started to cough up minimal yellow sputum  She states she feels like she may need abx before this gets worse  No wheezing, increased SOB, f/c/s, aches, HA, sore throat  She states does best with augmentin, and if any abx are given she requests diflucan to be sent as well  These were both prescribed by [place] on 05/24/20 and she completed- not having any symptoms then- given based on cxr  Note that she had HRCT completed on 06/21/20 Please advise thanks!  *has not been vaccinated against covid- states due to her adrenal problem and multiple med    allergies which I listed below  Allergies  Allergen Reactions  . Avelox [Moxifloxacin Hcl In Nacl] Anaphylaxis  . Biaxin [Clarithromycin] Anaphylaxis  . Lactose Intolerance (Gi)     Bloating, gas, congestion  . Sulfa Antibiotics Other (See Comments)    Fatigue   . Tobramycin     Severe headaches   . Yeast-Related Products Swelling    Tongue swelling Annamary Carolin

## 2020-06-23 NOTE — Telephone Encounter (Signed)
Called and spoke with patient to let her know of Dr. Daneil Dan recs. Advised patient to continue with nasal rinse and spray and advised her she can do 1200 mg of mucinex instead of 600 and see if that helps and if she feels worse or starts coughing up mucus with color to call and let us know. Patient expressed understanding. Nothing further needed at this time.

## 2020-06-26 ENCOUNTER — Telehealth: Payer: Self-pay | Admitting: Pulmonary Disease

## 2020-06-26 MED ORDER — AMOXICILLIN-POT CLAVULANATE 875-125 MG PO TABS: 1.0000 | ORAL_TABLET | Freq: Two times a day (BID) | ORAL | 0 refills | Status: DC

## 2020-06-26 MED ORDER — FLUCONAZOLE 100 MG PO TABS: 100.0000 mg | ORAL_TABLET | Freq: Every day | ORAL | 0 refills | Status: DC

## 2020-06-26 NOTE — Telephone Encounter (Signed)
Okay to send in Augmentin 875 mg twice daily for 10 days Send in Diflucan 100 mg once daily for 7 days

## 2020-06-26 NOTE — Telephone Encounter (Signed)
Spoke with the pt  She is asking about how often she can use her neb  I advised that she can use this every 6 hours as needed  Pt verbalized understanding  Nothing further needed

## 2020-06-26 NOTE — Telephone Encounter (Signed)
Called and spoke with pt and she stated that she followed the directions of PM over the weekend and did the mucinex 1200 mg BID and she stated that she found a few of augmetin and she did take 1 Friday, 2 saturday and 1 Sunday and this helped her so much.  She stated that with her adrenal issues she has to have abx to help get rid of this or it will turn into something bad for her.  She stated that she normally takes 10 days of augmentin and this gets rid of it.  PM please advise. Thanks   Allergies  Allergen Reactions  . Avelox [Moxifloxacin Hcl In Nacl] Anaphylaxis  . Biaxin [Clarithromycin] Anaphylaxis  . Lactose Intolerance (Gi)     Bloating, gas, congestion  . Sulfa Antibiotics Other (See Comments)    Fatigue   . Tobramycin     Severe headaches   . Yeast-Related Products Swelling    Tongue swelling Monica Harrison

## 2020-06-26 NOTE — Telephone Encounter (Signed)
Spoke with the pt and notified of recs per Dr Isaiah Serge  She verbalized understanding  Rxs sent to pharm  Pt to call if not improving

## 2020-08-28 ENCOUNTER — Telehealth: Payer: Self-pay | Admitting: Pulmonary Disease

## 2020-08-28 MED ORDER — AMOXICILLIN-POT CLAVULANATE 875-125 MG PO TABS: 1.0000 | ORAL_TABLET | Freq: Two times a day (BID) | ORAL | 0 refills | Status: DC

## 2020-08-28 MED ORDER — FLUCONAZOLE 100 MG PO TABS: 100.0000 mg | ORAL_TABLET | Freq: Every day | ORAL | 0 refills | Status: DC

## 2020-08-28 NOTE — Telephone Encounter (Signed)
Called patient but she did not answer. Left message for patient to call back.  

## 2020-08-28 NOTE — Telephone Encounter (Signed)
Called and spoke with pt letting her know the recs stated by Dr. Isaiah Serge and she verbalized understanding. Verified preferred pharmacy and have sent meds in for pt. Nothing further needed.

## 2020-08-28 NOTE — Telephone Encounter (Signed)
Called and spoke with patient. She stated that she started coughing on Saturday night and has not stopped since. So far the cough has been non-productive. She denied any increased SOB but did hear herself wheezing last night. Denied any fevers or body aches. She is concerned about this "going into her chest and developing into something else". She has been using Rhinocort and albuterol without any relief. She stated that Augmentin usually works well for her. She is also requesting Diflucan as well.   [facility] [place].   Dr. Isaiah Serge, can you please advise? Thanks!

## 2020-08-28 NOTE — Telephone Encounter (Signed)
Okay to send in Augmentin 875mg  twice daily for 10 days Send in Diflucan 100 mg once daily for 7 days

## 2020-10-30 ENCOUNTER — Encounter (INDEPENDENT_AMBULATORY_CARE_PROVIDER_SITE_OTHER): Payer: Self-pay | Admitting: *Deleted

## 2020-11-15 ENCOUNTER — Telehealth: Payer: Self-pay | Admitting: Pulmonary Disease

## 2020-11-15 MED ORDER — BENZONATATE 200 MG PO CAPS: 200.0000 mg | ORAL_CAPSULE | Freq: Three times a day (TID) | ORAL | 0 refills | Status: DC | PRN

## 2020-11-15 NOTE — Telephone Encounter (Signed)
Spoke with pt who states she can not take predisone because she is currently taking hydrocortisone. Pt also states that a 10 day course of Augmentin is usally more effective. Pt was scheduled for PFT but needs to be reminded that she needs to be Covid tested 3 days prior to PFT.   Please advise Beth as Dr. Isaiah Serge is unavailable.

## 2020-11-15 NOTE — Telephone Encounter (Signed)
I called and informed patient that I have sent in Tessalon perles to preferred pharmacy. Patient verbalized understanding and will let us know if symptoms persist/worsen. Nothing further needed.

## 2020-11-15 NOTE — Telephone Encounter (Signed)
Called and spoke to pt. Pt has had recurrent sinus congestion and cough leading to abx, see previous phone notes. Pt is c/o sinus congestion, prod cough with yellow mucus, chest tightness x 3 days. Pt denies f/c/s, wheezing, SOB. Pt states she is taking Tessalon and it is helping but she is nearly out and is wanting more. Pt also taking mucinex. Pt states she is using the netti pot. Pt last seen on 05/03/20 by Dr. Isaiah Serge for dyspnea. Pt was advised to follow up in 3 months with PFT. Scheduled OV with Dr. Isaiah Serge for 8/18 but noticed she needed a PFT after getting off the phone. Will get pt scheduled for PFT when we call pt back.    Dr. Isaiah Serge, please advise. Thanks.

## 2020-11-15 NOTE — Telephone Encounter (Signed)
Call in augmentin 875 mg bid for 5 days and prednisone 40 mg/day for 5 days

## 2020-11-15 NOTE — Telephone Encounter (Signed)
That's fine, please send in Rx for 200mg  TID prn cough

## 2020-11-15 NOTE — Telephone Encounter (Signed)
We can do 7 day course of Augmentin, 10 days not typical for sinobronchitis. Ok to hold off on prednisone as she has no wheezing or shortness of breath. She should be using over the counter steriod nasal spray like flonase or nasacort.   Will cc: Dr. Isaiah Serge

## 2020-11-15 NOTE — Telephone Encounter (Signed)
I called and spoke with patient regarding Beth recs and patient verbalized understanding. Patient is requesting Tessalon perles as this normally helps with a cough. Will route to [facility] for recs.  Beth, please advise on tessalon perles. Thanks!

## 2020-11-16 ENCOUNTER — Telehealth: Payer: Self-pay | Admitting: Pulmonary Disease

## 2020-11-16 MED ORDER — ALBUTEROL SULFATE HFA 108 (90 BASE) MCG/ACT IN AERS: 2.0000 | INHALATION_SPRAY | Freq: Four times a day (QID) | RESPIRATORY_TRACT | 5 refills | Status: DC | PRN

## 2020-11-16 NOTE — Telephone Encounter (Signed)
Called and spoke with Patient.  Patient requested a new prescription for her albuterol inhaler to be sent to [facility]. Albuterol prescription sent to requested pharmacy. Patient stated she is having nausea.  Patient stated she has experienced it all week and nothing seems to help.  Patient stated she is having a hard time eating or drinking anything. Patient requested Zofran to be sent to [facility].  Message routed to Dr. Isaiah Serge to advise

## 2020-11-16 NOTE — Telephone Encounter (Signed)
Called and spoke with pt who states she has only been on abx one day but states she is not feeling well. States that she is still coughing but says that the tessalon has been helping. Stated to pt that she could try taking either delsym or robitussin cough meds OTC to see if those give her any relief and if she still was having problems after trying one of those to call us back. Pt verbalized understanding. Nothing further needed.

## 2020-11-17 ENCOUNTER — Telehealth: Payer: Self-pay | Admitting: Pulmonary Disease

## 2020-11-17 ENCOUNTER — Telehealth (INDEPENDENT_AMBULATORY_CARE_PROVIDER_SITE_OTHER): Payer: Medicare Other | Admitting: Primary Care

## 2020-11-17 ENCOUNTER — Other Ambulatory Visit: Payer: Self-pay

## 2020-11-17 DIAGNOSIS — U071 COVID-19: Secondary | ICD-10-CM | POA: Diagnosis not present

## 2020-11-17 DIAGNOSIS — E2740 Unspecified adrenocortical insufficiency: Secondary | ICD-10-CM | POA: Insufficient documentation

## 2020-11-17 MED ORDER — MOLNUPIRAVIR EUA 200MG CAPSULE: 4.0000 | ORAL_CAPSULE | Freq: Two times a day (BID) | ORAL | 0 refills | Status: DC

## 2020-11-17 NOTE — Telephone Encounter (Signed)
Can set up video visit for Walsh at 11 or double book me at 2 pm for evaluation   Please contact office for sooner follow up if symptoms do not improve or worsen or seek emergency care

## 2020-11-17 NOTE — Patient Instructions (Addendum)
RX for Molnupiravir 800mg  BID x 5 days Continue Augmentin, Mucinex 1200mg  BID and delsym cough syrup Ok to take imodium as needed for diarrhea If you develop acute worsening shortness of breath, back pain or vital signs are abnormal to present to ED for evaluation  Follow-up - 5 days virtual visit; or 7-10 in office

## 2020-11-17 NOTE — Telephone Encounter (Signed)
I called [facility] to check and see if they have med  Spent 40 min on hold waiting to speak with a person  Went ahead and sent rx there, as pt prefers this pharm over going to [facility]  I advised her that if she gets to [facility] and they do not have, she needs to call [facility] and see if they have it  If so, we would send there  I am closing encounter

## 2020-11-17 NOTE — Telephone Encounter (Signed)
Can we call patient and see if either of those pharmacies would work for it. If so we can re-send Rx to that location.

## 2020-11-17 NOTE — Telephone Encounter (Signed)
Spoke with the pt  She states unable to obtain molnupavir at pharm  Her pharmacy told her it's not available anywhere in [place] right now  [place], did you have any other recs for her?

## 2020-11-17 NOTE — Progress Notes (Signed)
Virtual Visit via Video Note  I connected with Monica Harrison on 11/17/20 at 11:00 AM EDT by a video enabled telemedicine application and verified that I am speaking with the correct person using two identifiers.  Location: Patient: Home  Provider: Office    I discussed the limitations of evaluation and management by telemedicine and the availability of in person appointments. The patient expressed understanding and agreed to proceed.  History of Present Illness: 70 year old female. PMH significant for allergies, ?COPD/asthma, suspected sleep apnea, bronchitis, adrenal insufficiency, lyme's disease, angioedema. Patient of Dr. Isaiah Serge, seen on 05/03/20 for initial consult for dyspnea on exertion. Spirometry in 2017 showed no obstructive airway disease, needs repeat PFTs.   11/17/2020 - Interim hx  Patient tested positive for covid for yesterday, he symptoms started Sunday July 31st. Initially started out with cough and sinus congestion. Her cough is dry. She has some associated back discomfort. She is also experiencing nausea, diarrhea, dry mouth and decreased appetite. No significant shortness of breath, chest tightness or wheezing. Last episode of diarrhea was yesterday. She is taking mucinex 1200mg  twice a day. She picked up delsym cough syrup yesterday. She is currently on Augmentin. She takes hydrocortisone for adrenal insufficiency.  She is not on any cholesterol or blood pressure medications.  No fevers, chills, sweats, loss of smell/taste.    Observations/Objective:  Appears well; able to speak in full sentences without shortness of breath. She has a mild-moderate congested cough.   Home vital signs: O2 92% RA HR 94 BP 126/84  Assessment and Plan:  Moderate covid-19 - Symptoms started 5 days ago, tested positive for Covid-19 yesterday 11/16/20. She is unvaccinated. She is on several medications that have moderate-high reactions with Paxlovid, including Hydrocortisone and Xanax. Sending  in RX for Molnupiravir 800mg  BID x 5 days. Continue Augmentin, Mucinex 1200mg  BID and delsym cough syrup. She can take imodium as needed for diarrhea. Advised patient that if she develops acute worsening sob, back pain or VS are abnormal to present to ED for evaluation.    Follow Up Instructions:   FU in 5 days virtual visit; or 7-10 in office   I discussed the assessment and treatment plan with the patient. The patient was provided an opportunity to ask questions and all were answered. The patient agreed with the plan and demonstrated an understanding of the instructions.   The patient was advised to call back or seek an in-person evaluation if the symptoms worsen or if the condition fails to improve as anticipated.  I provided 35 minutes of non-face-to-face time during this encounter.   01/16/21, NP

## 2020-11-17 NOTE — Telephone Encounter (Signed)
Called spoke with patient.  She does not have mychart but can do video she thinks over the phone. Scheduled for 11am with BW will need link sent to her

## 2020-11-17 NOTE — Telephone Encounter (Signed)
I would ask her to call another local pharmacy or one in [place]. I have prescribed this twice this week without issue. I can not prescribe paxlovid without recent kidney function testing, ask her if she has had recent BMET?   Cc: our pharmacy team on input

## 2020-11-17 NOTE — Telephone Encounter (Signed)
Called spoke with patient.  She states she has tested positive for Covid for the first time.  She is not able to take vaccines due to reactions. Does not take the flu vaccine.   Her symptoms are severe nausea and vomiting, some diarrhea, coughing causing ribs to be sore and back pain-does have tessalon pearls at the house. She states she is not real short of breath, she is on augmentin has 3 days left and is taking albuterol 3x a day.  Cannot take prednisone but does take cortef and ups her dose when she is sick.  Dr. Isaiah Serge is off so routing to the provider of the day for recommendations.

## 2020-11-17 NOTE — Telephone Encounter (Addendum)
Per therapeutics locator, [facility] on [address] as well as [facility] on [address] in [place] have stock of molnupiravir. Many other local pharmacies don't have qty needed for treatment course of 5 days.  I am unsure of how accurate the map count is, but advise calling pharmacies listed above for supply count as a starting point.  Map: [email or web address]

## 2020-11-19 ENCOUNTER — Emergency Department (HOSPITAL_COMMUNITY): Payer: Medicare Other

## 2020-11-19 ENCOUNTER — Other Ambulatory Visit: Payer: Self-pay

## 2020-11-19 ENCOUNTER — Inpatient Hospital Stay (HOSPITAL_COMMUNITY)
Admission: EM | Admit: 2020-11-19 | Discharge: 2020-11-28 | DRG: 177 | Disposition: A | Discharge status: Home / Self Care | Payer: Medicare Other | Attending: Internal Medicine | Admitting: Internal Medicine

## 2020-11-19 ENCOUNTER — Encounter (HOSPITAL_COMMUNITY): Payer: Self-pay | Admitting: Internal Medicine

## 2020-11-19 DIAGNOSIS — J45909 Unspecified asthma, uncomplicated: Secondary | ICD-10-CM | POA: Diagnosis present

## 2020-11-19 DIAGNOSIS — Z881 Allergy status to other antibiotic agents status: Secondary | ICD-10-CM | POA: Diagnosis not present

## 2020-11-19 DIAGNOSIS — Z79818 Long term (current) use of other agents affecting estrogen receptors and estrogen levels: Secondary | ICD-10-CM | POA: Diagnosis not present

## 2020-11-19 DIAGNOSIS — J9600 Acute respiratory failure, unspecified whether with hypoxia or hypercapnia: Secondary | ICD-10-CM

## 2020-11-19 DIAGNOSIS — U071 COVID-19: Secondary | ICD-10-CM | POA: Diagnosis present

## 2020-11-19 DIAGNOSIS — Z79899 Other long term (current) drug therapy: Secondary | ICD-10-CM

## 2020-11-19 DIAGNOSIS — Z7951 Long term (current) use of inhaled steroids: Secondary | ICD-10-CM

## 2020-11-19 DIAGNOSIS — J9601 Acute respiratory failure with hypoxia: Secondary | ICD-10-CM | POA: Diagnosis present

## 2020-11-19 DIAGNOSIS — Z6831 Body mass index (BMI) 31.0-31.9, adult: Secondary | ICD-10-CM

## 2020-11-19 DIAGNOSIS — E039 Hypothyroidism, unspecified: Secondary | ICD-10-CM | POA: Diagnosis present

## 2020-11-19 DIAGNOSIS — E2740 Unspecified adrenocortical insufficiency: Secondary | ICD-10-CM | POA: Diagnosis present

## 2020-11-19 DIAGNOSIS — Z91011 Allergy to milk products: Secondary | ICD-10-CM | POA: Diagnosis not present

## 2020-11-19 DIAGNOSIS — R0902 Hypoxemia: Secondary | ICD-10-CM

## 2020-11-19 DIAGNOSIS — Z79890 Hormone replacement therapy (postmenopausal): Secondary | ICD-10-CM

## 2020-11-19 DIAGNOSIS — K5900 Constipation, unspecified: Secondary | ICD-10-CM | POA: Diagnosis present

## 2020-11-19 DIAGNOSIS — J1282 Pneumonia due to coronavirus disease 2019: Secondary | ICD-10-CM | POA: Diagnosis present

## 2020-11-19 DIAGNOSIS — Z91018 Allergy to other foods: Secondary | ICD-10-CM

## 2020-11-19 DIAGNOSIS — Z28310 Unvaccinated for covid-19: Secondary | ICD-10-CM

## 2020-11-19 DIAGNOSIS — Z7952 Long term (current) use of systemic steroids: Secondary | ICD-10-CM

## 2020-11-19 DIAGNOSIS — Z882 Allergy status to sulfonamides status: Secondary | ICD-10-CM

## 2020-11-19 DIAGNOSIS — E669 Obesity, unspecified: Secondary | ICD-10-CM | POA: Diagnosis present

## 2020-11-19 LAB — FIBRINOGEN: Fibrinogen: 460 mg/dL (ref 210–475)

## 2020-11-19 LAB — FERRITIN: Ferritin: 571 ng/mL — ABNORMAL HIGH (ref 11–307)

## 2020-11-19 LAB — COMPREHENSIVE METABOLIC PANEL
ALT: 41 U/L (ref 0–44)
AST: 61 U/L — ABNORMAL HIGH (ref 15–41)
Albumin: 3.5 g/dL (ref 3.5–5.0)
Alkaline Phosphatase: 42 U/L (ref 38–126)
Anion gap: 7 (ref 5–15)
BUN: 18 mg/dL (ref 8–23)
CO2: 20 mmol/L — ABNORMAL LOW (ref 22–32)
Calcium: 8.2 mg/dL — ABNORMAL LOW (ref 8.9–10.3)
Chloride: 103 mmol/L (ref 98–111)
Creatinine, Ser: 0.82 mg/dL (ref 0.44–1.00)
GFR, Estimated: >60 mL/min (ref >60)
Glucose, Bld: 92 mg/dL (ref 70–99)
Potassium: 3.7 mmol/L (ref 3.5–5.1)
Sodium: 130 mmol/L — ABNORMAL LOW (ref 135–145)
Total Bilirubin: 0.6 mg/dL (ref 0.3–1.2)
Total Protein: 7.1 g/dL (ref 6.5–8.1)

## 2020-11-19 LAB — CBC WITH DIFFERENTIAL/PLATELET
Abs Immature Granulocytes: 0.02 10*3/uL (ref 0.00–0.07)
Basophils Absolute: 0 10*3/uL (ref 0.0–0.1)
Basophils Relative: 1 %
Eosinophils Absolute: 0 10*3/uL (ref 0.0–0.5)
Eosinophils Relative: 0 %
HCT: 40.5 % (ref 36.0–46.0)
Hemoglobin: 13.9 g/dL (ref 12.0–15.0)
Immature Granulocytes: 1 %
Lymphocytes Relative: 18 %
Lymphs Abs: 0.8 10*3/uL (ref 0.7–4.0)
MCH: 31.2 pg (ref 26.0–34.0)
MCHC: 34.3 g/dL (ref 30.0–36.0)
MCV: 90.8 fL (ref 80.0–100.0)
Monocytes Absolute: 0.6 10*3/uL (ref 0.1–1.0)
Monocytes Relative: 14 %
Neutro Abs: 2.9 10*3/uL (ref 1.7–7.7)
Neutrophils Relative %: 66 %
Platelets: 213 10*3/uL (ref 150–400)
RBC: 4.46 MIL/uL (ref 3.87–5.11)
RDW: 13 % (ref 11.5–15.5)
WBC: 4.4 10*3/uL (ref 4.0–10.5)
nRBC: 0 % (ref 0.0–0.2)

## 2020-11-19 LAB — RESP PANEL BY RT-PCR (FLU A&B, COVID) ARPGX2
Influenza A by PCR: NEGATIVE
Influenza B by PCR: NEGATIVE
SARS Coronavirus 2 by RT PCR: POSITIVE — AB

## 2020-11-19 LAB — D-DIMER, QUANTITATIVE: D-Dimer, Quant: 1.17 ug/mL-FEU — ABNORMAL HIGH (ref 0.00–0.50)

## 2020-11-19 LAB — PROCALCITONIN: Procalcitonin: <0.1 ng/mL

## 2020-11-19 LAB — C-REACTIVE PROTEIN: CRP: 3.8 mg/dL — ABNORMAL HIGH (ref <1.0)

## 2020-11-19 LAB — LACTATE DEHYDROGENASE: LDH: 337 U/L — ABNORMAL HIGH (ref 98–192)

## 2020-11-19 MED ORDER — ONDANSETRON HCL 4 MG/2ML IJ SOLN
4.0000 mg | Freq: Four times a day (QID) | INTRAMUSCULAR | Status: DC | PRN
  Administered 2020-11-21: 4 mg via INTRAVENOUS
  Filled 2020-11-19: qty 2

## 2020-11-19 MED ORDER — METHYLPREDNISOLONE SODIUM SUCC 125 MG IJ SOLR
125.0000 mg | Freq: Once | INTRAMUSCULAR | Status: AC
  Administered 2020-11-19: 125 mg via INTRAVENOUS
  Filled 2020-11-19: qty 2

## 2020-11-19 MED ORDER — ONDANSETRON HCL 4 MG/2ML IJ SOLN
4.0000 mg | Freq: Four times a day (QID) | INTRAMUSCULAR | Status: DC | PRN
  Administered 2020-11-19: 4 mg via INTRAVENOUS
  Filled 2020-11-19: qty 2

## 2020-11-19 MED ORDER — ENOXAPARIN SODIUM 40 MG/0.4ML IJ SOSY
40.0000 mg | PREFILLED_SYRINGE | INTRAMUSCULAR | Status: DC
  Administered 2020-11-19 – 2020-11-27 (×9): 40 mg via SUBCUTANEOUS
  Filled 2020-11-19 (×9): qty 0.4

## 2020-11-19 MED ORDER — POLYETHYLENE GLYCOL 3350 17 G PO PACK
17.0000 g | PACK | Freq: Every day | ORAL | Status: DC | PRN
  Administered 2020-11-25: 17 g via ORAL
  Filled 2020-11-19: qty 1

## 2020-11-19 MED ORDER — ASCORBIC ACID 500 MG PO TABS
500.0000 mg | ORAL_TABLET | Freq: Every day | ORAL | Status: DC
  Administered 2020-11-20 – 2020-11-28 (×9): 500 mg via ORAL
  Filled 2020-11-19 (×9): qty 1

## 2020-11-19 MED ORDER — METHYLPREDNISOLONE SODIUM SUCC 40 MG IJ SOLR
40.0000 mg | Freq: Two times a day (BID) | INTRAMUSCULAR | Status: DC
  Administered 2020-11-20 – 2020-11-22 (×5): 40 mg via INTRAVENOUS
  Filled 2020-11-19 (×5): qty 1

## 2020-11-19 MED ORDER — TRAMADOL HCL 50 MG PO TABS: 50.0000 mg | ORAL_TABLET | ORAL | Status: DC

## 2020-11-19 MED ORDER — ZINC SULFATE 220 (50 ZN) MG PO CAPS
220.0000 mg | ORAL_CAPSULE | Freq: Every day | ORAL | Status: DC
  Administered 2020-11-20 – 2020-11-28 (×9): 220 mg via ORAL
  Filled 2020-11-19 (×9): qty 1

## 2020-11-19 MED ORDER — PREDNISONE 20 MG PO TABS: 50.0000 mg | ORAL_TABLET | Freq: Every day | ORAL | Status: DC

## 2020-11-19 MED ORDER — ALBUTEROL SULFATE HFA 108 (90 BASE) MCG/ACT IN AERS
2.0000 | INHALATION_SPRAY | Freq: Once | RESPIRATORY_TRACT | Status: AC
  Administered 2020-11-19: 2 via RESPIRATORY_TRACT
  Filled 2020-11-19: qty 6.7

## 2020-11-19 MED ORDER — METHYLPREDNISOLONE SODIUM SUCC 125 MG IJ SOLR: 125.0000 mg | Freq: Once | INTRAMUSCULAR | Status: DC

## 2020-11-19 MED ORDER — ONDANSETRON HCL 4 MG PO TABS: 4.0000 mg | ORAL_TABLET | Freq: Four times a day (QID) | ORAL | Status: DC | PRN

## 2020-11-19 MED ORDER — GUAIFENESIN-DM 100-10 MG/5ML PO SYRP
10.0000 mL | ORAL_SOLUTION | ORAL | Status: DC | PRN
  Administered 2020-11-21 – 2020-11-26 (×3): 10 mL via ORAL
  Filled 2020-11-19 (×3): qty 10

## 2020-11-19 MED ORDER — TRAMADOL HCL 50 MG PO TABS
50.0000 mg | ORAL_TABLET | ORAL | Status: AC
Start: 2020-11-19 — End: 2020-11-19
  Administered 2020-11-19: 50 mg via ORAL
  Filled 2020-11-19: qty 1

## 2020-11-19 NOTE — ED Notes (Signed)
Pt 92% on RA after resting. 86% on exertion

## 2020-11-19 NOTE — ED Provider Notes (Signed)
[facility] EMERGENCY DEPARTMENT Provider Note   CSN: [id number] Arrival date & time: 11/19/20  1502     History Chief Complaint  Patient presents with   Low O2    Monica Harrison is a 70 y.o. female.  Patient started with cough and shortness of breath about a week ago.  She had a positive COVID home test.  She is complaining of shortness of breath  The history is provided by the patient. No language interpreter was used.  Shortness of Breath Severity:  Moderate Onset quality:  Sudden Timing:  Constant Progression:  Worsening Chronicity:  New Context: activity   Relieved by:  Nothing Worsened by:  Nothing Ineffective treatments:  None tried Associated symptoms: no abdominal pain, no chest pain, no cough, no headaches and no rash       Past Medical History:  Diagnosis Date   Adrenal insufficiency (HCC)    Thyroid disease     Patient Active Problem List   Diagnosis Date Noted   Adrenal insufficiency (HCC) 11/17/2020   COVID-19 virus infection 11/17/2020    No past surgical history on file.   OB History   No obstetric history on file.     No family history on file.  Social History   Tobacco Use   Smoking status: Never   Smokeless tobacco: Never  Substance Use Topics   Alcohol use: No   Drug use: No    Home Medications Prior to Admission medications   Medication Sig Start Date End Date Taking? Authorizing Provider  albuterol (PROVENTIL) (2.5 MG/3ML) 0.083% nebulizer solution Take 2.5 mg by nebulization every 6 (six) hours as needed for wheezing or shortness of breath.    [provider]  albuterol (VENTOLIN HFA) 108 (90 Base) MCG/ACT inhaler Inhale 2 puffs into the lungs every 6 (six) hours as needed for wheezing or shortness of breath. 11/16/20   Mannam, Colbert Coyer, MD  ALPRAZolam (XANAX) 0.25 MG tablet Take 0.25 mg by mouth at bedtime.    [provider]  amoxicillin-clavulanate (AUGMENTIN) 875-125 MG tablet Take 1 tablet by mouth 2 (two)  times daily. 08/28/20   Mannam, Colbert Coyer, MD  Ascorbic Acid (VITAMIN C) 1000 MG tablet Take 2,000 mg by mouth 2 (two) times daily.    [provider]  B Complex Vitamins (B COMPLEX 50 PO) Take 1 tablet by mouth daily.     [provider]  benzonatate (TESSALON) 200 MG capsule Take 1 capsule (200 mg total) by mouth 3 (three) times daily as needed for cough. 11/15/20   Glenford Bayley, NP  budesonide (RHINOCORT AQUA) 32 MCG/ACT nasal spray Place 1 spray into both nostrils daily as needed for rhinitis.    [provider]  Calcium-Magnesium-Zinc (CAL-MAG-ZINC PO) Take 2 tablets by mouth at bedtime.    [provider]  Cholecalciferol (VITAMIN D3) 5000 units CAPS Take 5,000 Units by mouth at bedtime.     [provider]  Coenzyme Q10 (CO Q 10) 100 MG CAPS Take 100 mg by mouth every evening.     [provider]  Digestive Enzymes (ENZYME DIGEST) CAPS Take 1 capsule by mouth 2 (two) times daily with a meal.     [provider]  estradiol (ESTRACE) 1 MG tablet Take 0.5 mg by mouth every other day. Patient not taking: Reported on 11/17/2020    [provider]  fluconazole (DIFLUCAN) 100 MG tablet Take 1 tablet (100 mg total) by mouth daily. Patient not taking: Reported on 11/17/2020 08/28/20  Mannam, Praveen, MD  hydrocortisone (CORTEF) 5 MG tablet Take 5-15 mg by mouth See admin instructions. TAKE 15 MG IN THE MORNING AND 5 MG IN THE AFTERNOON.    [provider]  ibuprofen (ADVIL,MOTRIN) 200 MG tablet Take 400 mg by mouth every 6 (six) hours as needed for moderate pain.    [provider]  Magnesium 400 MG TABS Take 400 mg by mouth at bedtime.    [provider]  Misc Natural Products (IMMUNE FORMULA PO) Take 1 tablet by mouth 4 (four) times a week. Patient not taking: Reported on 11/17/2020    [provider]  molnupiravir EUA 200 mg CAPS Take 4 capsules (800 mg total) by mouth 2 (two) times daily for 5  days. 11/17/20 11/22/20  Glenford Bayley, NP  Multiple Vitamin (MULTIVITAMIN WITH MINERALS) TABS tablet Take 3 tablets by mouth daily.     [provider]  Omega-3 Fatty Acids (OMEGA 3 PO) Take 2 capsules by mouth 2 (two) times daily.    [provider]  Polyethyl Glycol-Propyl Glycol 0.4-0.3 % SOLN Place 1 drop into both eyes 4 (four) times daily.     [provider]  PRESCRIPTION MEDICATION Apply 1 application topically 2 (two) times a week. COMPOUNDED TESTOSTERONE CREAM - [facility].  Monday and Fridays Patient not taking: No sig reported    [provider]  PRESCRIPTION MEDICATION Apply 1 Pump topically at bedtime. COMPOUNDED PROGESTERONE CREAM - [facility]. Patient not taking: Reported on 11/17/2020    [provider]  Probiotic Product (PROBIOTIC PO) Take 1 capsule by mouth every evening.     [provider]  thyroid (ARMOUR) 32.5 MG tablet Take 32.5 mg by mouth daily at 2 PM.     [provider]  Thyroid 48.75 MG TABS Take 48.75 tablets by mouth daily.     [provider]  vitamin A 51761 UNIT capsule Take 10,000 Units by mouth every evening.     [provider]    Allergies    Avelox [moxifloxacin hcl in nacl], Biaxin [clarithromycin], Lactose intolerance (gi), Sulfa antibiotics, Tobramycin, and Yeast-related products  Review of Systems   Review of Systems  Constitutional:  Negative for appetite change and fatigue.  HENT:  Negative for congestion, ear discharge and sinus pressure.   Eyes:  Negative for discharge.  Respiratory:  Positive for shortness of breath. Negative for cough.   Cardiovascular:  Negative for chest pain.  Gastrointestinal:  Negative for abdominal pain and diarrhea.  Genitourinary:  Negative for frequency and hematuria.  Musculoskeletal:  Negative for back pain.  Skin:  Negative for rash.  Neurological:  Negative for seizures and headaches.  Psychiatric/Behavioral:  Negative  for hallucinations.    Physical Exam Updated Vital Signs BP 127/70   Pulse 94   Temp 97.9 F (36.6 C) (Oral)   Resp 19   Ht 5\' 3"  (1.6 m)   Wt 80.7 kg   SpO2 96%   BMI 31.53 kg/m   Physical Exam Vitals and nursing note reviewed.  Constitutional:      Appearance: She is well-developed.  HENT:     Head: Normocephalic.     Nose: Nose normal.  Eyes:     General: No scleral icterus.    Conjunctiva/sclera: Conjunctivae normal.  Neck:     Thyroid: No thyromegaly.  Cardiovascular:     Rate and Rhythm: Normal rate and regular rhythm.     Heart sounds: No murmur heard.   No  friction rub. No gallop.  Pulmonary:     Breath sounds: No stridor. No wheezing or rales.  Chest:     Chest wall: No tenderness.  Abdominal:     General: There is no distension.     Tenderness: There is no abdominal tenderness. There is no rebound.  Musculoskeletal:        General: Normal range of motion.     Cervical back: Neck supple.  Lymphadenopathy:     Cervical: No cervical adenopathy.  Skin:    Findings: No erythema or rash.  Neurological:     Mental Status: She is alert and oriented to person, place, and time.     Motor: No abnormal muscle tone.     Coordination: Coordination normal.  Psychiatric:        Behavior: Behavior normal.    ED Results / Procedures / Treatments   Labs (all labs ordered are listed, but only abnormal results are displayed) Labs Reviewed  COMPREHENSIVE METABOLIC PANEL - Abnormal; Notable for the following components:      Result Value   Sodium 130 (*)    CO2 20 (*)    Calcium 8.2 (*)    AST 61 (*)    All other components within normal limits  RESP PANEL BY RT-PCR (FLU A&B, COVID) ARPGX2  CBC WITH DIFFERENTIAL/PLATELET    EKG None  Radiology DG Chest 2 View  Result Date: 11/19/2020 CLINICAL DATA:  Dyspnea, COVID positive EXAM: CHEST - 2 VIEW COMPARISON:  05/16/2020 FINDINGS: Patchy peripheral asymmetric pulmonary infiltrates have developed within the  peripheral right mid lung zone and lung bases bilaterally, likely infectious or inflammatory in the acute setting. These are compatible with changes seen in the setting of acute COVID pneumonia. No pneumothorax or pleural effusion. Cardiac size within normal limits. Pulmonary vascularity is normal. No acute bone abnormality peer IMPRESSION: Interval development of patchy bilateral pulmonary infiltrates, likely infectious or inflammatory and compatible with those seen in acute COVID pneumonia. Electronically Signed   By: Helyn Numbers MD   On: 11/19/2020 16:08    Procedures Procedures   Medications Ordered in ED Medications  albuterol (VENTOLIN HFA) 108 (90 Base) MCG/ACT inhaler 2 puff (2 puffs Inhalation Given 11/19/20 1702)    ED Course  I have reviewed the triage vital signs and the nursing notes.  Pertinent labs & imaging results that were available during my care of the patient were reviewed by me and considered in my medical decision making (see chart for details). Patient with COVID pneumonia.  Patient is hypoxic on room air with O2 sats in the high 80s.  Specially when she ambulates.   MDM Rules/Calculators/A&P                           Patient with COVID-pneumonia and hypoxia along with adrenal insufficiency.  She will be admitted to medicine Final Clinical Impression(s) / ED Diagnoses Final diagnoses:  COVID-19    Rx / DC Orders ED Discharge Orders     None        Bethann Berkshire, MD 11/19/20 [id number]

## 2020-11-19 NOTE — ED Notes (Signed)
Pt ambulated at this time o2 dropped to 86-89 at this time pt  stated that she was very lightheaded at this time . Monica Harrison

## 2020-11-19 NOTE — ED Notes (Signed)
Date and time results received: 11/19/20 2000  Test: COVID Critical Value: POSITIVE +  Name of Provider Notified: Dr. Wendall Stade  Orders Received? Or Actions Taken?: no new orders at this time

## 2020-11-19 NOTE — ED Triage Notes (Signed)
Pt reports positive home covid test on 8/4. Pt says O2 levels at home in 80x. 86% on RA in triage. Pt also reports increased weakness. Pt ambulatory to triage.

## 2020-11-19 NOTE — H&P (Signed)
History and Physical    Monica Harrison PJA:[id number] DOB: [birth year] DOA: 11/19/2020  PCP: Pcp, No   Patient coming from: home  I have personally briefly reviewed patient's old medical records in [facility]  Chief Complaint: Weakness  HPI: Monica Harrison is a 70 y.o. female with medical history significant for adrenal insufficiency on chronic steroids.  Patient presented to the ED with complaints of generalized weakness, poor oral intake with nausea, cough and some difficulty breathing.  She reports some mild diarrhea.  She denies chest pain, no leg swelling.    Patient tested positive for COVID 8/4.  She reports her O2 sats were down to the 80s at home.  She subsequently called her outpatient provider and was sent a prescription 8/5 for Molnupiravir, but she says the pharmacy did not have this medication available.  She was also prescribed Augmentin which she took.  She reports that she is allergic to several things including vaccines, so she did not get COVID-19 vaccine.  She has not had any known COVID-19 infection since onset of COVID 19 the pandemic.  ED Course: O2 sat 83% on room air, sats greater than 92 % on 3 L.  Temperature 97.9.  Heart rate 102.  Respiratory rate 17-19.  Chest x-ray shows interval development of patchy bilateral pulmonary infiltrates compatible with COVID-pneumonia.  Hospitalist to admit.  Review of Systems: As per HPI all other systems reviewed and negative.  Past Medical History:  Diagnosis Date   Adrenal insufficiency (HCC)    Thyroid disease     No past surgical history on file.   reports that she has never smoked. She has never used smokeless tobacco. She reports that she does not drink alcohol and does not use drugs.  Allergies  Allergen Reactions   Avelox [Moxifloxacin Hcl In Nacl] Anaphylaxis   Biaxin [Clarithromycin] Anaphylaxis   Lactose Intolerance (Gi)     Bloating, gas, congestion   Sulfa Antibiotics Other (See Comments)    Fatigue     Tobramycin     Severe headaches    Yeast-Related Products Swelling    Tongue swelling /candida   Family history of hypertension.  Prior to Admission medications   Medication Sig Start Date End Date Taking? Authorizing Provider  albuterol (PROVENTIL) (2.5 MG/3ML) 0.083% nebulizer solution Take 2.5 mg by nebulization every 6 (six) hours as needed for wheezing or shortness of breath.    [provider]  albuterol (VENTOLIN HFA) 108 (90 Base) MCG/ACT inhaler Inhale 2 puffs into the lungs every 6 (six) hours as needed for wheezing or shortness of breath. 11/16/20   Mannam, Colbert Coyer, MD  ALPRAZolam (XANAX) 0.25 MG tablet Take 0.25 mg by mouth at bedtime.    [provider]  amoxicillin-clavulanate (AUGMENTIN) 875-125 MG tablet Take 1 tablet by mouth 2 (two) times daily. 08/28/20   Mannam, Colbert Coyer, MD  Ascorbic Acid (VITAMIN C) 1000 MG tablet Take 2,000 mg by mouth 2 (two) times daily.    [provider]  B Complex Vitamins (B COMPLEX 50 PO) Take 1 tablet by mouth daily.     [provider]  benzonatate (TESSALON) 200 MG capsule Take 1 capsule (200 mg total) by mouth 3 (three) times daily as needed for cough. 11/15/20   Glenford Bayley, NP  budesonide (RHINOCORT AQUA) 32 MCG/ACT nasal spray Place 1 spray into both nostrils daily as needed for rhinitis.    [provider]  Calcium-Magnesium-Zinc (CAL-MAG-ZINC PO) Take 2 tablets by mouth at bedtime.  [provider]  Cholecalciferol (VITAMIN D3) 5000 units CAPS Take 5,000 Units by mouth at bedtime.     [provider]  Coenzyme Q10 (CO Q 10) 100 MG CAPS Take 100 mg by mouth every evening.     [provider]  Digestive Enzymes (ENZYME DIGEST) CAPS Take 1 capsule by mouth 2 (two) times daily with a meal.     [provider]  estradiol (ESTRACE) 1 MG tablet Take 0.5 mg by mouth every other day. Patient not taking: Reported on 11/17/2020    [provider]   fluconazole (DIFLUCAN) 100 MG tablet Take 1 tablet (100 mg total) by mouth daily. Patient not taking: Reported on 11/17/2020 08/28/20   Chilton Greathouse, MD  hydrocortisone (CORTEF) 5 MG tablet Take 5-15 mg by mouth See admin instructions. TAKE 15 MG IN THE MORNING AND 5 MG IN THE AFTERNOON.    [provider]  ibuprofen (ADVIL,MOTRIN) 200 MG tablet Take 400 mg by mouth every 6 (six) hours as needed for moderate pain.    [provider]  Magnesium 400 MG TABS Take 400 mg by mouth at bedtime.    [provider]  Misc Natural Products (IMMUNE FORMULA PO) Take 1 tablet by mouth 4 (four) times a week. Patient not taking: Reported on 11/17/2020    [provider]  molnupiravir EUA 200 mg CAPS Take 4 capsules (800 mg total) by mouth 2 (two) times daily for 5 days. 11/17/20 11/22/20  Glenford Bayley, NP  Multiple Vitamin (MULTIVITAMIN WITH MINERALS) TABS tablet Take 3 tablets by mouth daily.     [provider]  Omega-3 Fatty Acids (OMEGA 3 PO) Take 2 capsules by mouth 2 (two) times daily.    [provider]  Polyethyl Glycol-Propyl Glycol 0.4-0.3 % SOLN Place 1 drop into both eyes 4 (four) times daily.     [provider]  PRESCRIPTION MEDICATION Apply 1 application topically 2 (two) times a week. COMPOUNDED TESTOSTERONE CREAM - [facility].  Monday and Fridays Patient not taking: No sig reported    [provider]  PRESCRIPTION MEDICATION Apply 1 Pump topically at bedtime. COMPOUNDED PROGESTERONE CREAM - [facility]. Patient not taking: Reported on 11/17/2020    [provider]  Probiotic Product (PROBIOTIC PO) Take 1 capsule by mouth every evening.     [provider]  thyroid (ARMOUR) 32.5 MG tablet Take 32.5 mg by mouth daily at 2 PM.     [provider]  Thyroid 48.75 MG TABS Take 48.75 tablets by mouth daily.     [provider]  vitamin A 84665 UNIT capsule Take 10,000 Units by mouth every  evening.     [provider]    Physical Exam: Vitals:   11/19/20 1508 11/19/20 1508 11/19/20 1511 11/19/20 1701  BP:  (!) 141/65  127/70  Pulse:  (!) 102  94  Resp:  17  19  Temp:  97.9 F (36.6 C)    TempSrc:  Oral    SpO2:  (!) 83% 92% 96%  Weight: 80.7 kg     Height: 5\' 3"  (1.6 m)       Constitutional: NAD, calm, comfortable Vitals:   11/19/20 1508 11/19/20 1508 11/19/20 1511 11/19/20 1701  BP:  (!) 141/65  127/70  Pulse:  (!) 102  94  Resp:  17  19  Temp:  97.9 F (36.6 C)    TempSrc:  Oral    SpO2:  (!) 83% 92%  96%  Weight: 80.7 kg     Height: 5\' 3"  (1.6 m)      Eyes: PERRL, lids and conjunctivae normal ENMT: Mucous membranes are moist.   Neck: normal, supple, no masses, no thyromegaly Respiratory: Normal respiratory effort. No accessory muscle use.  Cardiovascular: Regular rate and rhythm, no murmurs / rubs / gallops. No extremity edema. 2+ pedal pulses.   Abdomen: no tenderness, no masses palpated. No hepatosplenomegaly. Bowel sounds positive.  Musculoskeletal: no clubbing / cyanosis. No joint deformity upper and lower extremities. Good ROM, no contractures. Normal muscle tone.  Skin: no rashes, lesions, ulcers. No induration Neurologic: No apparent cranial formality, moving extremities spontaneously. Psychiatric: Normal judgment and insight. Alert and oriented x 3. Normal mood.   Labs on Admission: I have personally reviewed following labs and imaging studies  CBC: Recent Labs  Lab 11/19/20 1536  WBC 4.4  NEUTROABS 2.9  HGB 13.9  HCT 40.5  MCV 90.8  PLT 213   Basic Metabolic Panel: Recent Labs  Lab 11/19/20 1536  NA 130*  K 3.7  CL 103  CO2 20*  GLUCOSE 92  BUN 18  CREATININE 0.82  CALCIUM 8.2*   Liver Function Tests: Recent Labs  Lab 11/19/20 1536  AST 61*  ALT 41  ALKPHOS 42  BILITOT 0.6  PROT 7.1  ALBUMIN 3.5    Radiological Exams on Admission: DG Chest 2 View  Result Date: 11/19/2020 CLINICAL DATA:  Dyspnea, COVID  positive EXAM: CHEST - 2 VIEW COMPARISON:  05/16/2020 FINDINGS: Patchy peripheral asymmetric pulmonary infiltrates have developed within the peripheral right mid lung zone and lung bases bilaterally, likely infectious or inflammatory in the acute setting. These are compatible with changes seen in the setting of acute COVID pneumonia. No pneumothorax or pleural effusion. Cardiac size within normal limits. Pulmonary vascularity is normal. No acute bone abnormality peer IMPRESSION: Interval development of patchy bilateral pulmonary infiltrates, likely infectious or inflammatory and compatible with those seen in acute COVID pneumonia. Electronically Signed   By: 07/14/2020 MD   On: 11/19/2020 16:08    EKG:  None, Pending.  Assessment/Plan Principal Problem:   Pneumonia due to COVID-19 virus Active Problems:   Acute hypoxemic respiratory failure (HCC)   Adrenal insufficiency (HCC)   Pneumonia due to COVID-19 virus with acute respiratory failure-O2 sats 83% on room air currently on 3 L sats greater than 92%.  Chest x-ray shows interval development of patchy bilateral pulmonary infiltrates compatible with COVID-pneumonia.  She is not vaccinated, she reports multiple vaccine allergies. -IV Solu-Medrol 125 mg x 1, continue weight-based dosing of Solu-Medrol -Talked to patient about remdesivir, she declined. -Obtain and trend inflammatory panel -Vitamins, as needed inhalers, mucolytic's  Adrenal insufficiency -Hold home hydrocortisone for now, continue with IV Solu-Medrol for COVID-19 pneumonia.   DVT prophylaxis: Lovenox Code Status: Full code Family Communication: None at bedside Disposition Plan:  ~ 2 days Consults called: None Admission status: Inpt, tele I certify that at the point of admission it is my clinical judgment that the patient will require inpatient hospital care spanning beyond 2 midnights from the point of admission due to high intensity of service, high risk for further  deterioration and high frequency of surveillance required.   01/19/2021 MD [facility]  11/19/2020, 7:27 PM

## 2020-11-20 LAB — CBC WITH DIFFERENTIAL/PLATELET
Abs Immature Granulocytes: 0.02 10*3/uL (ref 0.00–0.07)
Basophils Absolute: 0 10*3/uL (ref 0.0–0.1)
Basophils Relative: 1 %
Eosinophils Absolute: 0 10*3/uL (ref 0.0–0.5)
Eosinophils Relative: 0 %
HCT: 40.9 % (ref 36.0–46.0)
Hemoglobin: 13.5 g/dL (ref 12.0–15.0)
Immature Granulocytes: 1 %
Lymphocytes Relative: 34 %
Lymphs Abs: 0.9 10*3/uL (ref 0.7–4.0)
MCH: 30.2 pg (ref 26.0–34.0)
MCHC: 33 g/dL (ref 30.0–36.0)
MCV: 91.5 fL (ref 80.0–100.0)
Monocytes Absolute: 0.1 10*3/uL (ref 0.1–1.0)
Monocytes Relative: 6 %
Neutro Abs: 1.5 10*3/uL — ABNORMAL LOW (ref 1.7–7.7)
Neutrophils Relative %: 58 %
Platelets: 247 10*3/uL (ref 150–400)
RBC: 4.47 MIL/uL (ref 3.87–5.11)
RDW: 12.7 % (ref 11.5–15.5)
WBC: 2.6 10*3/uL — ABNORMAL LOW (ref 4.0–10.5)
nRBC: 0 % (ref 0.0–0.2)

## 2020-11-20 LAB — COMPREHENSIVE METABOLIC PANEL
ALT: 37 U/L (ref 0–44)
AST: 53 U/L — ABNORMAL HIGH (ref 15–41)
Albumin: 3.3 g/dL — ABNORMAL LOW (ref 3.5–5.0)
Alkaline Phosphatase: 41 U/L (ref 38–126)
Anion gap: 8 (ref 5–15)
BUN: 19 mg/dL (ref 8–23)
CO2: 24 mmol/L (ref 22–32)
Calcium: 8.9 mg/dL (ref 8.9–10.3)
Chloride: 105 mmol/L (ref 98–111)
Creatinine, Ser: 0.78 mg/dL (ref 0.44–1.00)
GFR, Estimated: >60 mL/min (ref >60)
Glucose, Bld: 135 mg/dL — ABNORMAL HIGH (ref 70–99)
Potassium: 4.7 mmol/L (ref 3.5–5.1)
Sodium: 137 mmol/L (ref 135–145)
Total Bilirubin: 0.7 mg/dL (ref 0.3–1.2)
Total Protein: 7.1 g/dL (ref 6.5–8.1)

## 2020-11-20 LAB — FERRITIN: Ferritin: 565 ng/mL — ABNORMAL HIGH (ref 11–307)

## 2020-11-20 LAB — C-REACTIVE PROTEIN: CRP: 4.2 mg/dL — ABNORMAL HIGH (ref <1.0)

## 2020-11-20 LAB — MAGNESIUM: Magnesium: 2.3 mg/dL (ref 1.7–2.4)

## 2020-11-20 LAB — D-DIMER, QUANTITATIVE: D-Dimer, Quant: 0.9 ug/mL-FEU — ABNORMAL HIGH (ref 0.00–0.50)

## 2020-11-20 LAB — PHOSPHORUS: Phosphorus: 4.5 mg/dL (ref 2.5–4.6)

## 2020-11-20 MED ORDER — TRAMADOL HCL 50 MG PO TABS
50.0000 mg | ORAL_TABLET | Freq: Four times a day (QID) | ORAL | Status: DC | PRN
  Administered 2020-11-20 – 2020-11-25 (×6): 50 mg via ORAL
  Filled 2020-11-20 (×7): qty 1

## 2020-11-20 MED ORDER — GUAIFENESIN ER 600 MG PO TB12
600.0000 mg | ORAL_TABLET | Freq: Two times a day (BID) | ORAL | Status: DC
  Administered 2020-11-20 – 2020-11-28 (×16): 600 mg via ORAL
  Filled 2020-11-20 (×15): qty 1

## 2020-11-20 MED ORDER — POLYETHYL GLYCOL-PROPYL GLYCOL 0.4-0.3 % OP SOLN: 1.0000 [drp] | Freq: Four times a day (QID) | OPHTHALMIC | Status: DC

## 2020-11-20 MED ORDER — POLYVINYL ALCOHOL 1.4 % OP SOLN
1.0000 [drp] | Freq: Four times a day (QID) | OPHTHALMIC | Status: DC
  Administered 2020-11-20 – 2020-11-28 (×27): 1 [drp] via OPHTHALMIC
  Filled 2020-11-20 (×3): qty 15

## 2020-11-20 NOTE — Telephone Encounter (Signed)
Ok to send in refill for albuterol Send a prescription for zofran 5 mg PO bid as needed for nauses

## 2020-11-20 NOTE — Progress Notes (Signed)
PROGRESS NOTE    Monica Harrison  PPI:[id number] DOB: 02-Jul-1950 DOA: 11/19/2020 PCP: Pcp, No    Chief Complaint  Patient presents with   Low O2    Brief Narrative:   Monica Harrison is a 70 y.o. female with medical history significant for adrenal insufficiency on chronic steroids.  Patient presented to the ED with complaints of generalized weakness, poor oral intake with nausea, cough and some difficulty breathing. Patient tested positive for COVID 8/4.  She reports her O2 sats were down to the 80s at home.  She subsequently called her outpatient provider and was sent a prescription 8/5 for Molnupiravir, but she says the pharmacy did not have this medication available. She presented to ED with hypoxia.  CXR shows patchy bilateral  pulm infiltrates compatible with COVID pneumonia.   Assessment & Plan:   Principal Problem:   Pneumonia due to COVID-19 virus Active Problems:   Adrenal insufficiency (HCC)   Acute hypoxemic respiratory failure (HCC)   Acute Respiratory failure with hypoxia due to COVID 19 Pneumonia.  [place] oxygen to keep sats greaer than 90% IV Steroids and transition to oral as scheduled.  Follow inflammatory markers.  She reports feeling a little better.  Scheduled bronchodilators.     Adrenal insufficiency:  Transition to home cortisone after the completion of prednisone.    DVT prophylaxis: (Lovenox) Code Status: (Full Code) Family Communication: (none at bedside. ) Disposition:   Status is: Inpatient  Remains inpatient appropriate because:IV treatments appropriate due to intensity of illness or inability to take PO  Dispo: The patient is from: Home              Anticipated d/c is to: Home              Patient currently is not medically stable to d/c.   Difficult to place patient No       Consultants:  Non.e   Procedures: none.   Antimicrobials: (none.    Subjective: Breathing better.   Objective: Vitals:   11/19/20 2010 11/19/20 2030  11/19/20 2116 11/20/20 0549  BP: 125/65 123/62 118/63 103/62  Pulse: 92 78 82 67  Resp: (!) 24 (!) 21 17 19   Temp: 98.4 F (36.9 C)  98.9 F (37.2 C) 98.1 F (36.7 C)  TempSrc: Oral  Oral Oral  SpO2: 94% 95% 92% 91%  Weight:      Height:        Intake/Output Summary (Last 24 hours) at 11/20/2020 1156 Last data filed at 11/20/2020 1031 Gross per 24 hour  Intake 240 ml  Output --  Net 240 ml   Filed Weights   11/19/20 1508  Weight: 80.7 kg    Examination:  General exam: Appears calm and comfortable  Respiratory system: Clear to auscultation. Respiratory effort normal. Cardiovascular system: S1 & S2 heard, RRR. No JVD,  No pedal edema. Gastrointestinal system: Abdomen is nondistended, soft and nontender.. Normal bowel sounds heard. Central nervous system: Alert and oriented. No focal neurological deficits. Extremities: Symmetric 5 x 5 power. Skin: No rashes, lesions or ulcers Psychiatry:  Mood & affect appropriate.     Data Reviewed: I have personally reviewed following labs and imaging studies  CBC: Recent Labs  Lab 11/19/20 1536 11/20/20 0638  WBC 4.4 2.6*  NEUTROABS 2.9 1.5*  HGB 13.9 13.5  HCT 40.5 40.9  MCV 90.8 91.5  PLT 213 247    Basic Metabolic Panel: Recent Labs  Lab 11/19/20 1536 11/20/20 0638  NA 130* 137  K 3.7 4.7  CL 103 105  CO2 20* 24  GLUCOSE 92 135*  BUN 18 19  CREATININE 0.82 0.78  CALCIUM 8.2* 8.9  MG  --  2.3  PHOS  --  4.5    GFR: Estimated Creatinine Clearance: 64.9 mL/min (by C-G formula based on SCr of 0.78 mg/dL).  Liver Function Tests: Recent Labs  Lab 11/19/20 1536 11/20/20 0638  AST 61* 53*  ALT 41 37  ALKPHOS 42 41  BILITOT 0.6 0.7  PROT 7.1 7.1  ALBUMIN 3.5 3.3*    CBG: No results for input(s): GLUCAP in the last 168 hours.   Recent Results (from the past 240 hour(s))  Resp Panel by RT-PCR (Flu A&B, Covid) Nasopharyngeal Swab     Status: Abnormal   Collection Time: 11/19/20  5:00 PM   Specimen:  Nasopharyngeal Swab; Nasopharyngeal(NP) swabs in vial transport medium  Result Value Ref Range Status   SARS Coronavirus 2 by RT PCR POSITIVE (A) NEGATIVE Final    Comment: RESULT CALLED TO, READ BACK BY AND VERIFIED WITH: SPENCE,H ON 11/19/20 BY JUW (NOTE) SARS-CoV-2 target nucleic acids are DETECTED.  The SARS-CoV-2 RNA is generally detectable in upper respiratory specimens during the acute phase of infection. Positive results are indicative of the presence of the identified virus, but do not rule out bacterial infection or co-infection with other pathogens not detected by the test. Clinical correlation with patient history and other diagnostic information is necessary to determine patient infection status. The expected result is Negative.  Fact Sheet for Patients: BloggerCourse.com  Fact Sheet for Healthcare Providers: SeriousBroker.it  This test is not yet approved or cleared by the [place] FDA and  has been authorized for detection and/or diagnosis of SARS-CoV-2 by FDA under an Emergency Use Authorization (EUA).  This EUA will remain in effect (meaning this test can be used) f or the duration of  the COVID-19 declaration under Section 564(b)(1) of the Act, 21 U.S.C. section 360bbb-3(b)(1), unless the authorization is terminated or revoked sooner.     Influenza A by PCR NEGATIVE NEGATIVE Final   Influenza B by PCR NEGATIVE NEGATIVE Final    Comment: (NOTE) The Xpert Xpress SARS-CoV-2/FLU/RSV plus assay is intended as an aid in the diagnosis of influenza from Nasopharyngeal swab specimens and should not be used as a sole basis for treatment. Nasal washings and aspirates are unacceptable for Xpert Xpress SARS-CoV-2/FLU/RSV testing.  Fact Sheet for Patients: BloggerCourse.com  Fact Sheet for Healthcare Providers: SeriousBroker.it  This test is not yet approved or  cleared by the [place] FDA and has been authorized for detection and/or diagnosis of SARS-CoV-2 by FDA under an Emergency Use Authorization (EUA). This EUA will remain in effect (meaning this test can be used) for the duration of the COVID-19 declaration under Section 564(b)(1) of the Act, 21 U.S.C. section 360bbb-3(b)(1), unless the authorization is terminated or revoked.  Performed at [facility], [address], [place], [place] [address]          Radiology Studies: DG Chest 2 View  Result Date: 11/19/2020 CLINICAL DATA:  Dyspnea, COVID positive EXAM: CHEST - 2 VIEW COMPARISON:  05/16/2020 FINDINGS: Patchy peripheral asymmetric pulmonary infiltrates have developed within the peripheral right mid lung zone and lung bases bilaterally, likely infectious or inflammatory in the acute setting. These are compatible with changes seen in the setting of acute COVID pneumonia. No pneumothorax or pleural effusion. Cardiac size within normal limits. Pulmonary vascularity is normal. No acute bone abnormality peer  IMPRESSION: Interval development of patchy bilateral pulmonary infiltrates, likely infectious or inflammatory and compatible with those seen in acute COVID pneumonia. Electronically Signed   By: Helyn Numbers MD   On: 11/19/2020 16:08        Scheduled Meds:  vitamin C  500 mg Oral Daily   enoxaparin (LOVENOX) injection  40 mg Subcutaneous Q24H   methylPREDNISolone (SOLU-MEDROL) injection  40 mg Intravenous Q12H   Followed by   Melene Muller ON 11/23/2020] predniSONE  50 mg Oral Daily   polyvinyl alcohol  1 drop Both Eyes QID   zinc sulfate  220 mg Oral Daily   Continuous Infusions:   LOS: 1 day       Kathlen Mody, MD [facility]   To contact the attending provider between 7A-7P or the covering provider during after hours 7P-7A, please log into the web site [email or web address] and access using universal [place] password for that web site. If you do not have the password,  please call the hospital operator.  11/20/2020, 11:56 AM

## 2020-11-20 NOTE — Progress Notes (Signed)
Patient complained of not being able to void. Patient was bladder scanned and it revealed greater than . Patient was ambulated to toilet and she voided . Patient has no complaints at this time. Will continue to monitor.

## 2020-11-20 NOTE — Telephone Encounter (Signed)
Patient is currently admitted at [facility] for Covid 19.

## 2020-11-21 ENCOUNTER — Inpatient Hospital Stay (HOSPITAL_COMMUNITY): Payer: Medicare Other

## 2020-11-21 LAB — CBC WITH DIFFERENTIAL/PLATELET
Abs Immature Granulocytes: 0.06 10*3/uL (ref 0.00–0.07)
Basophils Absolute: 0 10*3/uL (ref 0.0–0.1)
Basophils Relative: 0 %
Eosinophils Absolute: 0 10*3/uL (ref 0.0–0.5)
Eosinophils Relative: 0 %
HCT: 40.4 % (ref 36.0–46.0)
Hemoglobin: 13.6 g/dL (ref 12.0–15.0)
Immature Granulocytes: 1 %
Lymphocytes Relative: 17 %
Lymphs Abs: 1.4 10*3/uL (ref 0.7–4.0)
MCH: 30.4 pg (ref 26.0–34.0)
MCHC: 33.7 g/dL (ref 30.0–36.0)
MCV: 90.2 fL (ref 80.0–100.0)
Monocytes Absolute: 0.9 10*3/uL (ref 0.1–1.0)
Monocytes Relative: 11 %
Neutro Abs: 5.7 10*3/uL (ref 1.7–7.7)
Neutrophils Relative %: 71 %
Platelets: 287 10*3/uL (ref 150–400)
RBC: 4.48 MIL/uL (ref 3.87–5.11)
RDW: 12.4 % (ref 11.5–15.5)
WBC: 8.2 10*3/uL (ref 4.0–10.5)
nRBC: 0 % (ref 0.0–0.2)

## 2020-11-21 LAB — COMPREHENSIVE METABOLIC PANEL
ALT: 36 U/L (ref 0–44)
AST: 54 U/L — ABNORMAL HIGH (ref 15–41)
Albumin: 3.3 g/dL — ABNORMAL LOW (ref 3.5–5.0)
Alkaline Phosphatase: 40 U/L (ref 38–126)
Anion gap: 7 (ref 5–15)
BUN: 23 mg/dL (ref 8–23)
CO2: 24 mmol/L (ref 22–32)
Calcium: 8.8 mg/dL — ABNORMAL LOW (ref 8.9–10.3)
Chloride: 105 mmol/L (ref 98–111)
Creatinine, Ser: 0.82 mg/dL (ref 0.44–1.00)
GFR, Estimated: >60 mL/min (ref >60)
Glucose, Bld: 151 mg/dL — ABNORMAL HIGH (ref 70–99)
Potassium: 4.6 mmol/L (ref 3.5–5.1)
Sodium: 136 mmol/L (ref 135–145)
Total Bilirubin: 0.8 mg/dL (ref 0.3–1.2)
Total Protein: 7 g/dL (ref 6.5–8.1)

## 2020-11-21 LAB — BLOOD GAS, ARTERIAL
Acid-base deficit: 2 mmol/L (ref 0.0–2.0)
Bicarbonate: 23.6 mmol/L (ref 20.0–28.0)
FIO2: 44
O2 Saturation: 96.3 %
Patient temperature: 36.8
pCO2 arterial: 28.1 mmHg — ABNORMAL LOW (ref 32.0–48.0)
pH, Arterial: 7.486 — ABNORMAL HIGH (ref 7.350–7.450)
pO2, Arterial: 75.2 mmHg — ABNORMAL LOW (ref 83.0–108.0)

## 2020-11-21 LAB — FERRITIN: Ferritin: 546 ng/mL — ABNORMAL HIGH (ref 11–307)

## 2020-11-21 LAB — PHOSPHORUS: Phosphorus: 2.9 mg/dL (ref 2.5–4.6)

## 2020-11-21 LAB — MRSA NEXT GEN BY PCR, NASAL: MRSA by PCR Next Gen: NOT DETECTED

## 2020-11-21 LAB — D-DIMER, QUANTITATIVE: D-Dimer, Quant: 0.85 ug/mL-FEU — ABNORMAL HIGH (ref 0.00–0.50)

## 2020-11-21 LAB — C-REACTIVE PROTEIN: CRP: 1.6 mg/dL — ABNORMAL HIGH (ref <1.0)

## 2020-11-21 LAB — MAGNESIUM: Magnesium: 2.4 mg/dL (ref 1.7–2.4)

## 2020-11-21 MED ORDER — HYDROCORTISONE 5 MG PO TABS
5.0000 mg | ORAL_TABLET | Freq: Every evening | ORAL | Status: DC
  Administered 2020-11-21 – 2020-11-27 (×7): 5 mg via ORAL
  Filled 2020-11-21 (×9): qty 1

## 2020-11-21 MED ORDER — CHLORHEXIDINE GLUCONATE CLOTH 2 % EX PADS
6.0000 | MEDICATED_PAD | Freq: Every day | CUTANEOUS | Status: DC
  Administered 2020-11-21 – 2020-11-28 (×7): 6 via TOPICAL

## 2020-11-21 MED ORDER — HYDROCORTISONE 10 MG PO TABS
10.0000 mg | ORAL_TABLET | Freq: Every morning | ORAL | Status: DC
  Administered 2020-11-21 – 2020-11-28 (×8): 10 mg via ORAL
  Filled 2020-11-21 (×10): qty 1

## 2020-11-21 MED ORDER — ALBUTEROL SULFATE HFA 108 (90 BASE) MCG/ACT IN AERS
INHALATION_SPRAY | RESPIRATORY_TRACT | Status: AC
  Filled 2020-11-21: qty 6.7

## 2020-11-21 MED ORDER — ALBUTEROL SULFATE HFA 108 (90 BASE) MCG/ACT IN AERS
1.0000 | INHALATION_SPRAY | RESPIRATORY_TRACT | Status: DC | PRN
  Administered 2020-11-21: 1 via RESPIRATORY_TRACT
  Administered 2020-11-24 – 2020-11-26 (×3): 2 via RESPIRATORY_TRACT

## 2020-11-21 MED ORDER — ALPRAZOLAM 0.25 MG PO TABS
0.2500 mg | ORAL_TABLET | Freq: Every day | ORAL | Status: DC
  Administered 2020-11-21 – 2020-11-27 (×7): 0.25 mg via ORAL
  Filled 2020-11-21 (×7): qty 1

## 2020-11-21 MED ORDER — THYROID 30 MG PO TABS
45.0000 mg | ORAL_TABLET | Freq: Every day | ORAL | Status: DC
  Administered 2020-11-22: 45 mg via ORAL
  Filled 2020-11-21 (×3): qty 2

## 2020-11-21 MED ORDER — MORPHINE SULFATE (PF) 4 MG/ML IV SOLN
4.0000 mg | Freq: Once | INTRAVENOUS | Status: AC
  Administered 2020-11-21: 4 mg via INTRAVENOUS
  Filled 2020-11-21: qty 1

## 2020-11-21 MED ORDER — THYROID 30 MG PO TABS
30.0000 mg | ORAL_TABLET | Freq: Every evening | ORAL | Status: DC
  Administered 2020-11-21 – 2020-11-27 (×7): 30 mg via ORAL
  Filled 2020-11-21 (×8): qty 1

## 2020-11-21 NOTE — Progress Notes (Signed)
Patient complaining of persistent headache despite receiving 50 mg tramadol at 1905. Dr Carren Rang notified.

## 2020-11-21 NOTE — Progress Notes (Signed)
Patient arrived to ICU unit room 3. Orineted to unit and call system, verbalized understanding. Patient assisted to bed and placed in comfortable position, call light in reach of patient. Plan of care updated. No other needs voiced by patient at this time.

## 2020-11-21 NOTE — Progress Notes (Signed)
Upon entering room this morning patient was on 2.5L sat was 87% increased patient to 6L per MD/respiratory called and informed sat's returned and maintained between 91-94%. Attempted to wean patient to 5L and her o2 was at 87% @ 1700 upon entering patients room she worked with the incentive spirometry achieving 1000 ml 2-3 times and did the flutter valve 2 times. Pt was complaining of feeling SOB O2 dropped lowest 79% MD and respiratory called. Respiratory arrived at bedside albuterol inhaler given and O2 increased to 10L High flow [place] SATs maintained at 93%. ABG ordered. Chest xray ordered. Will continue to closely monitor patient.

## 2020-11-21 NOTE — Progress Notes (Signed)
PROGRESS NOTE    Monica Harrison  CHE:[id number] DOB: [birth year] DOA: 11/19/2020 PCP: Oneita Hurt, No   Brief Narrative:   Monica Harrison is a 69 y.o. female with medical history significant for adrenal insufficiency on chronic steroids.  Patient presented to the ED with complaints of generalized weakness, poor oral intake with nausea, cough and some difficulty breathing. Patient tested positive for COVID 8/4.  She reports her O2 sats were down to the 80s at home.  She subsequently called her outpatient provider and was sent a prescription 8/5 for Molnupiravir, but she says the pharmacy did not have this medication available. She presented to ED with hypoxia. CXR shows patchy bilateral  pulm infiltrates compatible with COVID pneumonia.  Assessment & Plan:   Principal Problem:   Pneumonia due to COVID-19 virus Active Problems:   Adrenal insufficiency (HCC)   Acute hypoxemic respiratory failure (HCC)   Acute Respiratory failure with hypoxia due to COVID 19 Pneumonia. Monica Harrison oxygen to keep sats greater than 90% Repeat chest x-ray 8/9 with no significant findings IV Steroids and transition to oral as scheduled. Follow inflammatory markers. She reports feeling a little better. Scheduled bronchodilators. Wean oxygen as tolerated     Adrenal insufficiency: Resume home hydrocortisone  Hypothyroidism Resume home thyroid medication   DVT prophylaxis: Lovenox Code Status: Full Family Communication: None at bedside Disposition Plan:  Status is: Inpatient  Remains inpatient appropriate because:IV treatments appropriate due to intensity of illness or inability to take PO and Inpatient level of care appropriate due to severity of illness  Dispo: The patient is from: Home              Anticipated d/c is to: Home              Patient currently is not medically stable to d/c.   Difficult to place patient No   Consultants:  None  Procedures:  See below  Antimicrobials:  Anti-infectives  (From admission, onward)    None       Subjective: Patient seen and evaluated today with noted need for increasing oxygen requirement this morning.  She does not appear to be having worsening shortness of breath.  No acute concerns or events noted overnight.  Objective: Vitals:   11/20/20 2202 11/21/20 0503 11/21/20 0805 11/21/20 0807  BP: (!) 107/57 (!) 114/59 (!) 108/53   Pulse: 72 72 72   Resp: 18 15    Temp: 98 F (36.7 C) 97.6 F (36.4 C) 98 F (36.7 C)   TempSrc: Oral Oral Oral   SpO2: (!) 89% 91% (!) 87% 92%  Weight:      Height:        Intake/Output Summary (Last 24 hours) at 11/21/2020 1248 Last data filed at 11/21/2020 0900 Gross per 24 hour  Intake 480 ml  Output --  Net 480 ml   Filed Weights   11/19/20 1508  Weight: 80.7 kg    Examination:  General exam: Appears calm and comfortable  Respiratory system: Clear to auscultation. Respiratory effort normal.  Currently on 6 L nasal cannula oxygen Cardiovascular system: S1 & S2 heard, RRR.  Gastrointestinal system: Abdomen is soft Central nervous system: Alert and awake Extremities: No edema Skin: No significant lesions noted Psychiatry: Flat affect.    Data Reviewed: I have personally reviewed following labs and imaging studies  CBC: Recent Labs  Lab 11/19/20 1536 11/20/20 0638 11/21/20 0539  WBC 4.4 2.6* 8.2  NEUTROABS 2.9 1.5* 5.7  HGB 13.9 13.5 13.6  HCT  40.5 40.9 40.4  MCV 90.8 91.5 90.2  PLT 213 247 287   Basic Metabolic Panel: Recent Labs  Lab 11/19/20 1536 11/20/20 0638 11/21/20 0539  NA 130* 137 136  K 3.7 4.7 4.6  CL 103 105 105  CO2 20* 24 24  GLUCOSE 92 135* 151*  BUN 18 19 23   CREATININE 0.82 0.78 0.82  CALCIUM 8.2* 8.9 8.8*  MG  --  2.3 2.4  PHOS  --  4.5 2.9   GFR: Estimated Creatinine Clearance: 63.3 mL/min (by C-G formula based on SCr of 0.82 mg/dL). Liver Function Tests: Recent Labs  Lab 11/19/20 1536 11/20/20 0638 11/21/20 0539  AST 61* 53* 54*  ALT 41 37  36  ALKPHOS 42 41 40  BILITOT 0.6 0.7 0.8  PROT 7.1 7.1 7.0  ALBUMIN 3.5 3.3* 3.3*   No results for input(s): LIPASE, AMYLASE in the last 168 hours. No results for input(s): AMMONIA in the last 168 hours. Coagulation Profile: No results for input(s): INR, PROTIME in the last 168 hours. Cardiac Enzymes: No results for input(s): CKTOTAL, CKMB, CKMBINDEX, TROPONINI in the last 168 hours. BNP (last 3 results) No results for input(s): PROBNP in the last 8760 hours. HbA1C: No results for input(s): HGBA1C in the last 72 hours. CBG: No results for input(s): GLUCAP in the last 168 hours. Lipid Profile: No results for input(s): CHOL, HDL, LDLCALC, TRIG, CHOLHDL, LDLDIRECT in the last 72 hours. Thyroid Function Tests: No results for input(s): TSH, T4TOTAL, FREET4, T3FREE, THYROIDAB in the last 72 hours. Anemia Panel: Recent Labs    11/20/20 0638 11/21/20 0539  FERRITIN 565* 546*   Sepsis Labs: Recent Labs  Lab 11/19/20 1536  PROCALCITON <0.10    Recent Results (from the past 240 hour(s))  Resp Panel by RT-PCR (Flu A&B, Covid) Nasopharyngeal Swab     Status: Abnormal   Collection Time: 11/19/20  5:00 PM   Specimen: Nasopharyngeal Swab; Nasopharyngeal(NP) swabs in vial transport medium  Result Value Ref Range Status   SARS Coronavirus 2 by RT PCR POSITIVE (A) NEGATIVE Final    Comment: RESULT CALLED TO, READ BACK BY AND VERIFIED WITH: Monica Harrison,H ON 11/19/20 BY JUW (NOTE) SARS-CoV-2 target nucleic acids are DETECTED.  The SARS-CoV-2 RNA is generally detectable in upper respiratory specimens during the acute phase of infection. Positive results are indicative of the presence of the identified virus, but do not rule out bacterial infection or co-infection with other pathogens not detected by the test. Clinical correlation with patient history and other diagnostic information is necessary to determine patient infection status. The expected result is Negative.  Fact Sheet for  Patients: 01/19/21  Fact Sheet for Healthcare Providers: BloggerCourse.com  This test is not yet approved or cleared by the [employer] FDA and  has been authorized for detection and/or diagnosis of SARS-CoV-2 by FDA under an Emergency Use Authorization (EUA).  This EUA will remain in effect (meaning this test can be used) f or the duration of  the COVID-19 declaration under Section 564(b)(1) of the Act, 21 U.S.C. section 360bbb-3(b)(1), unless the authorization is terminated or revoked sooner.     Influenza A by PCR NEGATIVE NEGATIVE Final   Influenza B by PCR NEGATIVE NEGATIVE Final    Comment: (NOTE) The Xpert Xpress SARS-CoV-2/FLU/RSV plus assay is intended as an aid in the diagnosis of influenza from Nasopharyngeal swab specimens and should not be used as a sole basis for treatment. Nasal washings and aspirates are unacceptable for Xpert Xpress SARS-CoV-2/FLU/RSV testing.  Fact Sheet for Patients: BloggerCourse.com  Fact Sheet for Healthcare Providers: SeriousBroker.it  This test is not yet approved or cleared by the [place] FDA and has been authorized for detection and/or diagnosis of SARS-CoV-2 by FDA under an Emergency Use Authorization (EUA). This EUA will remain in effect (meaning this test can be used) for the duration of the COVID-19 declaration under Section 564(b)(1) of the Act, 21 U.S.C. section 360bbb-3(b)(1), unless the authorization is terminated or revoked.  Performed at [facility], [address] [place], [place] [address]          Radiology Studies: DG Chest 2 View  Result Date: 11/19/2020 CLINICAL DATA:  Dyspnea, COVID positive EXAM: CHEST - 2 VIEW COMPARISON:  05/16/2020 FINDINGS: Patchy peripheral asymmetric pulmonary infiltrates have developed within the peripheral right mid lung zone and lung bases bilaterally, likely infectious  or inflammatory in the acute setting. These are compatible with changes seen in the setting of acute COVID pneumonia. No pneumothorax or pleural effusion. Cardiac size within normal limits. Pulmonary vascularity is normal. No acute bone abnormality peer IMPRESSION: Interval development of patchy bilateral pulmonary infiltrates, likely infectious or inflammatory and compatible with those seen in acute COVID pneumonia. Electronically Signed   By: Helyn Numbers MD   On: 11/19/2020 16:08   DG CHEST PORT 1 VIEW  Result Date: 11/21/2020 CLINICAL DATA:  Hypoxemia, COVID-19 positive. EXAM: PORTABLE CHEST 1 VIEW COMPARISON:  November 19, 2020. FINDINGS: The heart size and mediastinal contours are within normal limits. Stable patchy peripheral airspace opacities are noted most consistent with COVID-19 pneumonia. The visualized skeletal structures are unremarkable. IMPRESSION: Stable bilateral lung opacities as described above. Electronically Signed   By: Lupita Raider M.D.   On: 11/21/2020 10:18        Scheduled Meds:  ALPRAZolam  0.25 mg Oral QHS   vitamin C  500 mg Oral Daily   enoxaparin (LOVENOX) injection  40 mg Subcutaneous Q24H   guaiFENesin  600 mg Oral BID   hydrocortisone  10 mg Oral q AM   hydrocortisone  5 mg Oral QPM   methylPREDNISolone (SOLU-MEDROL) injection  40 mg Intravenous Q12H   Followed by   Melene Muller ON 11/23/2020] predniSONE  50 mg Oral Daily   polyvinyl alcohol  1 drop Both Eyes QID   thyroid  30 mg Oral QPM   [START ON 11/22/2020] thyroid  45 mg Oral QAC breakfast   zinc sulfate  220 mg Oral Daily     LOS: 2 days    Time spent: 35 minutes    Kree Armato Hoover Brunette, DO [facility]  If 7PM-7AM, please contact night-coverage [email or web address] 11/21/2020, 12:48 PM

## 2020-11-22 LAB — CBC WITH DIFFERENTIAL/PLATELET
Abs Immature Granulocytes: 0.06 10*3/uL (ref 0.00–0.07)
Basophils Absolute: 0 10*3/uL (ref 0.0–0.1)
Basophils Relative: 0 %
Eosinophils Absolute: 0 10*3/uL (ref 0.0–0.5)
Eosinophils Relative: 0 %
HCT: 37.2 % (ref 36.0–46.0)
Hemoglobin: 12.6 g/dL (ref 12.0–15.0)
Immature Granulocytes: 1 %
Lymphocytes Relative: 12 %
Lymphs Abs: 0.9 10*3/uL (ref 0.7–4.0)
MCH: 30.7 pg (ref 26.0–34.0)
MCHC: 33.9 g/dL (ref 30.0–36.0)
MCV: 90.5 fL (ref 80.0–100.0)
Monocytes Absolute: 0.6 10*3/uL (ref 0.1–1.0)
Monocytes Relative: 8 %
Neutro Abs: 5.9 10*3/uL (ref 1.7–7.7)
Neutrophils Relative %: 79 %
Platelets: 303 10*3/uL (ref 150–400)
RBC: 4.11 MIL/uL (ref 3.87–5.11)
RDW: 12.5 % (ref 11.5–15.5)
WBC: 7.6 10*3/uL (ref 4.0–10.5)
nRBC: 0 % (ref 0.0–0.2)

## 2020-11-22 LAB — COMPREHENSIVE METABOLIC PANEL
ALT: 29 U/L (ref 0–44)
AST: 39 U/L (ref 15–41)
Albumin: 3 g/dL — ABNORMAL LOW (ref 3.5–5.0)
Alkaline Phosphatase: 36 U/L — ABNORMAL LOW (ref 38–126)
Anion gap: 7 (ref 5–15)
BUN: 22 mg/dL (ref 8–23)
CO2: 23 mmol/L (ref 22–32)
Calcium: 8.6 mg/dL — ABNORMAL LOW (ref 8.9–10.3)
Chloride: 106 mmol/L (ref 98–111)
Creatinine, Ser: 0.64 mg/dL (ref 0.44–1.00)
GFR, Estimated: >60 mL/min (ref >60)
Glucose, Bld: 148 mg/dL — ABNORMAL HIGH (ref 70–99)
Potassium: 4.3 mmol/L (ref 3.5–5.1)
Sodium: 136 mmol/L (ref 135–145)
Total Bilirubin: 0.7 mg/dL (ref 0.3–1.2)
Total Protein: 6.5 g/dL (ref 6.5–8.1)

## 2020-11-22 LAB — C-REACTIVE PROTEIN: CRP: 0.8 mg/dL (ref <1.0)

## 2020-11-22 LAB — MAGNESIUM: Magnesium: 2.4 mg/dL (ref 1.7–2.4)

## 2020-11-22 LAB — PHOSPHORUS: Phosphorus: 3 mg/dL (ref 2.5–4.6)

## 2020-11-22 LAB — D-DIMER, QUANTITATIVE: D-Dimer, Quant: 0.66 ug/mL-FEU — ABNORMAL HIGH (ref 0.00–0.50)

## 2020-11-22 LAB — FERRITIN: Ferritin: 441 ng/mL — ABNORMAL HIGH (ref 11–307)

## 2020-11-22 MED ORDER — METHYLPREDNISOLONE SODIUM SUCC 40 MG IJ SOLR
40.0000 mg | Freq: Two times a day (BID) | INTRAMUSCULAR | Status: DC
  Administered 2020-11-22 – 2020-11-28 (×12): 40 mg via INTRAVENOUS
  Filled 2020-11-22 (×12): qty 1

## 2020-11-22 MED ORDER — BARICITINIB 2 MG PO TABS
4.0000 mg | ORAL_TABLET | Freq: Every day | ORAL | Status: DC
  Administered 2020-11-22 – 2020-11-28 (×7): 4 mg via ORAL
  Filled 2020-11-22 (×7): qty 2

## 2020-11-22 MED ORDER — SODIUM CHLORIDE 0.9 % IV SOLN
100.0000 mg | Freq: Every day | INTRAVENOUS | Status: AC
  Administered 2020-11-23 – 2020-11-26 (×4): 100 mg via INTRAVENOUS
  Filled 2020-11-22: qty 100
  Filled 2020-11-22: qty 20
  Filled 2020-11-22: qty 100
  Filled 2020-11-22: qty 20

## 2020-11-22 MED ORDER — REMDESIVIR 100 MG IV SOLR
100.0000 mg | INTRAVENOUS | Status: AC
  Administered 2020-11-22 (×2): 100 mg via INTRAVENOUS
  Filled 2020-11-22 (×4): qty 20

## 2020-11-22 NOTE — Progress Notes (Signed)
PROGRESS NOTE    Monica Harrison  WUJ:[id number] DOB: 1950-08-20 DOA: 11/19/2020 PCP: Oneita Hurt, No   Brief Narrative:   Monica Harrison is a 70 y.o. female with medical history significant for adrenal insufficiency on chronic steroids.  Patient presented to the ED with complaints of generalized weakness, poor oral intake with nausea, cough and some difficulty breathing. Patient tested positive for COVID 8/4.  She reports her O2 sats were down to the 80s at home.  She subsequently called her outpatient provider and was sent a prescription 8/5 for Molnupiravir, but she says the pharmacy did not have this medication available. She presented to ED with hypoxia. CXR shows patchy bilateral  pulm infiltrates compatible with COVID pneumonia.  Assessment & Plan:   Principal Problem:   Pneumonia due to COVID-19 virus Active Problems:   Adrenal insufficiency (HCC)   Acute hypoxemic respiratory failure (HCC)   Acute Respiratory failure with hypoxia due to COVID 19 Pneumonia-worsening -Currently requiring 10 L nasal cannula oxygen and therefore transition to stepdown unit on 8/9 -Started on remdesivir 8/10 -Continue IV Solu-Medrol -Started on baricitinib 8/10 -Monitor inflammatory markers which are downtrending -Chest x-ray with no acute abnormalities on 8/9 -Wean oxygen as tolerated     Adrenal insufficiency: Resume home hydrocortisone   Hypothyroidism Resume home thyroid medication     DVT prophylaxis: Lovenox Code Status: Full Family Communication: Discussed with husband on phone 8/10 Disposition Plan:  Status is: Inpatient   Remains inpatient appropriate because:IV treatments appropriate due to intensity of illness or inability to take PO and Inpatient level of care appropriate due to severity of illness   Dispo: The patient is from: Home              Anticipated d/c is to: Home              Patient currently is not medically stable to d/c.              Difficult to place patient  No     Consultants:  None   Procedures:  See below  Antimicrobials:  Anti-infectives (From admission, onward)    None       Subjective: Patient seen and evaluated today with no new acute complaints or concerns.  She is noted to have increasing oxygen requirements yesterday afternoon and is now on 10 L nasal cannula.  Objective: Vitals:   11/22/20 0600 11/22/20 0700 11/22/20 0800 11/22/20 0900  BP: (!) 98/40 129/62 (!) 92/43 114/63  Pulse: (!) 52 62 (!) 57 83  Resp: 15 17 16 18   Temp:    97.7 F (36.5 C)  TempSrc:    Oral  SpO2: 93% 92% 91% (!) 87%  Weight:      Height:        Intake/Output Summary (Last 24 hours) at 11/22/2020 0919 Last data filed at 11/22/2020 0900 Gross per 24 hour  Intake 480 ml  Output --  Net 480 ml   Filed Weights   11/19/20 1508 11/21/20 1802  Weight: 80.7 kg 76 kg    Examination:  General exam: Appears calm and comfortable  Respiratory system: Clear to auscultation. Respiratory effort normal.  Currently on 10 L nasal cannula oxygen. Cardiovascular system: S1 & S2 heard, RRR.  Gastrointestinal system: Abdomen is soft Central nervous system: Alert and awake Extremities: No edema Skin: No significant lesions noted Psychiatry: Flat affect.    Data Reviewed: I have personally reviewed following labs and imaging studies  CBC: Recent Labs  Lab 11/19/20  1536 11/20/20 0638 11/21/20 0539 11/22/20 0313  WBC 4.4 2.6* 8.2 7.6  NEUTROABS 2.9 1.5* 5.7 5.9  HGB 13.9 13.5 13.6 12.6  HCT 40.5 40.9 40.4 37.2  MCV 90.8 91.5 90.2 90.5  PLT 213 247 287 303   Basic Metabolic Panel: Recent Labs  Lab 11/19/20 1536 11/20/20 0638 11/21/20 0539 11/22/20 0313  NA 130* 137 136 136  K 3.7 4.7 4.6 4.3  CL 103 105 105 106  CO2 20* 24 24 23   GLUCOSE 92 135* 151* 148*  BUN 18 19 23 22   CREATININE 0.82 0.78 0.82 0.64  CALCIUM 8.2* 8.9 8.8* 8.6*  MG  --  2.3 2.4 2.4  PHOS  --  4.5 2.9 3.0   GFR: Estimated Creatinine Clearance: 62.9  mL/min (by C-G formula based on SCr of 0.64 mg/dL). Liver Function Tests: Recent Labs  Lab 11/19/20 1536 11/20/20 0638 11/21/20 0539 11/22/20 0313  AST 61* 53* 54* 39  ALT 41 37 36 29  ALKPHOS 42 41 40 36*  BILITOT 0.6 0.7 0.8 0.7  PROT 7.1 7.1 7.0 6.5  ALBUMIN 3.5 3.3* 3.3* 3.0*   No results for input(s): LIPASE, AMYLASE in the last 168 hours. No results for input(s): AMMONIA in the last 168 hours. Coagulation Profile: No results for input(s): INR, PROTIME in the last 168 hours. Cardiac Enzymes: No results for input(s): CKTOTAL, CKMB, CKMBINDEX, TROPONINI in the last 168 hours. BNP (last 3 results) No results for input(s): PROBNP in the last 8760 hours. HbA1C: No results for input(s): HGBA1C in the last 72 hours. CBG: No results for input(s): GLUCAP in the last 168 hours. Lipid Profile: No results for input(s): CHOL, HDL, LDLCALC, TRIG, CHOLHDL, LDLDIRECT in the last 72 hours. Thyroid Function Tests: No results for input(s): TSH, T4TOTAL, FREET4, T3FREE, THYROIDAB in the last 72 hours. Anemia Panel: Recent Labs    11/21/20 0539 11/22/20 0313  FERRITIN 546* 441*   Sepsis Labs: Recent Labs  Lab 11/19/20 1536  PROCALCITON <0.10    Recent Results (from the past 240 hour(s))  Resp Panel by RT-PCR (Flu A&B, Covid) Nasopharyngeal Swab     Status: Abnormal   Collection Time: 11/19/20  5:00 PM   Specimen: Nasopharyngeal Swab; Nasopharyngeal(NP) swabs in vial transport medium  Result Value Ref Range Status   SARS Coronavirus 2 by RT PCR POSITIVE (A) NEGATIVE Final    Comment: RESULT CALLED TO, READ BACK BY AND VERIFIED WITH: SPENCE,H ON 11/19/20 BY JUW (NOTE) SARS-CoV-2 target nucleic acids are DETECTED.  The SARS-CoV-2 RNA is generally detectable in upper respiratory specimens during the acute phase of infection. Positive results are indicative of the presence of the identified virus, but do not rule out bacterial infection or co-infection with other pathogens  not detected by the test. Clinical correlation with patient history and other diagnostic information is necessary to determine patient infection status. The expected result is Negative.  Fact Sheet for Patients: 01/19/21  Fact Sheet for Healthcare Providers: 01/19/21  This test is not yet approved or cleared by the [employer] FDA and  has been authorized for detection and/or diagnosis of SARS-CoV-2 by FDA under an Emergency Use Authorization (EUA).  This EUA will remain in effect (meaning this test can be used) f or the duration of  the COVID-19 declaration under Section 564(b)(1) of the Act, 21 U.S.C. section 360bbb-3(b)(1), unless the authorization is terminated or revoked sooner.     Influenza A by PCR NEGATIVE NEGATIVE Final   Influenza B by PCR  NEGATIVE NEGATIVE Final    Comment: (NOTE) The Xpert Xpress SARS-CoV-2/FLU/RSV plus assay is intended as an aid in the diagnosis of influenza from Nasopharyngeal swab specimens and should not be used as a sole basis for treatment. Nasal washings and aspirates are unacceptable for Xpert Xpress SARS-CoV-2/FLU/RSV testing.  Fact Sheet for Patients: BloggerCourse.com  Fact Sheet for Healthcare Providers: SeriousBroker.it  This test is not yet approved or cleared by the [place] FDA and has been authorized for detection and/or diagnosis of SARS-CoV-2 by FDA under an Emergency Use Authorization (EUA). This EUA will remain in effect (meaning this test can be used) for the duration of the COVID-19 declaration under Section 564(b)(1) of the Act, 21 U.S.C. section 360bbb-3(b)(1), unless the authorization is terminated or revoked.  Performed at [facility], [address], [place], [place] [address]   MRSA Next Gen by PCR, Nasal     Status: None   Collection Time: 11/21/20  6:50 PM   Specimen: Nasal Mucosa; Nasal  Swab  Result Value Ref Range Status   MRSA by PCR Next Gen NOT DETECTED NOT DETECTED Final    Comment: (NOTE) The GeneXpert MRSA Assay (FDA approved for NASAL specimens only), is one component of a comprehensive MRSA colonization surveillance program. It is not intended to diagnose MRSA infection nor to guide or monitor treatment for MRSA infections. Test performance is not FDA approved in patients less than 2 years old. Performed at [facility], [address], [place], [place] [address]          Radiology Studies: DG CHEST PORT 1 VIEW  Result Date: 11/21/2020 CLINICAL DATA:  Hypoxemia, COVID-19 positive. EXAM: PORTABLE CHEST 1 VIEW COMPARISON:  November 19, 2020. FINDINGS: The heart size and mediastinal contours are within normal limits. Stable patchy peripheral airspace opacities are noted most consistent with COVID-19 pneumonia. The visualized skeletal structures are unremarkable. IMPRESSION: Stable bilateral lung opacities as described above. Electronically Signed   By: Lupita Raider M.D.   On: 11/21/2020 10:18   DG Chest Port 1V same Day  Result Date: 11/21/2020 CLINICAL DATA:  Hypoxemia, COVID-19 positivity EXAM: PORTABLE CHEST 1 VIEW COMPARISON:  Film from earlier in the same day. FINDINGS: Cardiac shadow is stable. Aortic calcifications are again identified. Patchy bilateral airspace opacities are noted slightly improved on the left when compared with the prior exam. No new focal abnormality is noted. IMPRESSION: Slight improved aeration when compare with the prior exam. Electronically Signed   By: Alcide Clever M.D.   On: 11/21/2020 19:12        Scheduled Meds:  ALPRAZolam  0.25 mg Oral QHS   vitamin C  500 mg Oral Daily   baricitinib  4 mg Oral Daily   Chlorhexidine Gluconate Cloth  6 each Topical Daily   enoxaparin (LOVENOX) injection  40 mg Subcutaneous Q24H   guaiFENesin  600 mg Oral BID   hydrocortisone  10 mg Oral q AM   hydrocortisone  5 mg Oral QPM    methylPREDNISolone (SOLU-MEDROL) injection  40 mg Intravenous Q12H   polyvinyl alcohol  1 drop Both Eyes QID   thyroid  30 mg Oral QPM   thyroid  45 mg Oral QAC breakfast   zinc sulfate  220 mg Oral Daily     LOS: 3 days    Time spent: 35 minutes    Betheny Suchecki Hoover Brunette, DO [facility]  If 7PM-7AM, please contact night-coverage [email or web address] 11/22/2020, 9:19 AM

## 2020-11-23 LAB — C-REACTIVE PROTEIN: CRP: 0.6 mg/dL (ref <1.0)

## 2020-11-23 LAB — COMPREHENSIVE METABOLIC PANEL
ALT: 48 U/L — ABNORMAL HIGH (ref 0–44)
AST: 56 U/L — ABNORMAL HIGH (ref 15–41)
Albumin: 3.1 g/dL — ABNORMAL LOW (ref 3.5–5.0)
Alkaline Phosphatase: 36 U/L — ABNORMAL LOW (ref 38–126)
Anion gap: 5 (ref 5–15)
BUN: 21 mg/dL (ref 8–23)
CO2: 25 mmol/L (ref 22–32)
Calcium: 8.8 mg/dL — ABNORMAL LOW (ref 8.9–10.3)
Chloride: 106 mmol/L (ref 98–111)
Creatinine, Ser: 0.62 mg/dL (ref 0.44–1.00)
GFR, Estimated: >60 mL/min (ref >60)
Glucose, Bld: 131 mg/dL — ABNORMAL HIGH (ref 70–99)
Potassium: 4.4 mmol/L (ref 3.5–5.1)
Sodium: 136 mmol/L (ref 135–145)
Total Bilirubin: 0.6 mg/dL (ref 0.3–1.2)
Total Protein: 6.5 g/dL (ref 6.5–8.1)

## 2020-11-23 LAB — CBC WITH DIFFERENTIAL/PLATELET
Abs Immature Granulocytes: 0.05 10*3/uL (ref 0.00–0.07)
Basophils Absolute: 0 10*3/uL (ref 0.0–0.1)
Basophils Relative: 0 %
Eosinophils Absolute: 0 10*3/uL (ref 0.0–0.5)
Eosinophils Relative: 0 %
HCT: 39.3 % (ref 36.0–46.0)
Hemoglobin: 13 g/dL (ref 12.0–15.0)
Immature Granulocytes: 1 %
Lymphocytes Relative: 20 %
Lymphs Abs: 1.3 10*3/uL (ref 0.7–4.0)
MCH: 30.4 pg (ref 26.0–34.0)
MCHC: 33.1 g/dL (ref 30.0–36.0)
MCV: 91.8 fL (ref 80.0–100.0)
Monocytes Absolute: 0.7 10*3/uL (ref 0.1–1.0)
Monocytes Relative: 11 %
Neutro Abs: 4.5 10*3/uL (ref 1.7–7.7)
Neutrophils Relative %: 68 %
Platelets: 302 10*3/uL (ref 150–400)
RBC: 4.28 MIL/uL (ref 3.87–5.11)
RDW: 12.6 % (ref 11.5–15.5)
WBC: 6.5 10*3/uL (ref 4.0–10.5)
nRBC: 0 % (ref 0.0–0.2)

## 2020-11-23 LAB — FERRITIN: Ferritin: 492 ng/mL — ABNORMAL HIGH (ref 11–307)

## 2020-11-23 LAB — PHOSPHORUS: Phosphorus: 2.9 mg/dL (ref 2.5–4.6)

## 2020-11-23 LAB — MAGNESIUM: Magnesium: 2.5 mg/dL — ABNORMAL HIGH (ref 1.7–2.4)

## 2020-11-23 LAB — D-DIMER, QUANTITATIVE: D-Dimer, Quant: 0.78 ug/mL-FEU — ABNORMAL HIGH (ref 0.00–0.50)

## 2020-11-23 MED ORDER — THYROID 30 MG PO TABS: 45.0000 mg | ORAL_TABLET | Freq: Every day | ORAL | Status: DC

## 2020-11-23 MED ORDER — THYROID 30 MG PO TABS
45.0000 mg | ORAL_TABLET | Freq: Every day | ORAL | Status: DC
  Administered 2020-11-23 – 2020-11-28 (×6): 45 mg via ORAL
  Filled 2020-11-23 (×5): qty 2

## 2020-11-23 NOTE — Progress Notes (Signed)
PROGRESS NOTE    Monica Harrison  TIW:[id number] DOB: 08-25-1950 DOA: 11/19/2020 PCP: Oneita Hurt, No   Brief Narrative:   Monica Harrison is a 70 y.o. female with medical history significant for adrenal insufficiency on chronic steroids.  Patient presented to the ED with complaints of generalized weakness, poor oral intake with nausea, cough and some difficulty breathing. Patient tested positive for COVID 8/4.  She reports her O2 sats were down to the 80s at home.  She subsequently called her outpatient provider and was sent a prescription 8/5 for Molnupiravir, but she says the pharmacy did not have this medication available. She presented to ED with hypoxia. CXR shows patchy bilateral  pulm infiltrates compatible with COVID pneumonia.  Assessment & Plan:   Principal Problem:   Pneumonia due to COVID-19 virus Active Problems:   Adrenal insufficiency (HCC)   Acute hypoxemic respiratory failure (HCC)   Acute Respiratory failure with hypoxia due to COVID 19 Pneumonia-worsening -Currently requiring 10 L nasal cannula oxygen and therefore transition to stepdown unit on 8/9 -Started on remdesivir 8/10 -Continue IV Solu-Medrol -Started on baricitinib 8/10 -Monitor inflammatory markers which are downtrending -Chest x-ray with no acute abnormalities on 8/9 -Wean oxygen as tolerated, now to 8L   Adrenal insufficiency: Resume home hydrocortisone   Hypothyroidism Resume home thyroid medication     DVT prophylaxis: Lovenox Code Status: Full Family Communication: Discussed with husband on phone 8/11 Disposition Plan:  Status is: Inpatient   Remains inpatient appropriate because:IV treatments appropriate due to intensity of illness or inability to take PO and Inpatient level of care appropriate due to severity of illness   Dispo: The patient is from: Home              Anticipated d/c is to: Home              Patient currently is not medically stable to d/c.              Difficult to place  patient No     Consultants:  None   Procedures:  See below  Antimicrobials:  Anti-infectives (From admission, onward)    Start     Dose/Rate Route Frequency Ordered Stop   11/23/20 1000  remdesivir 100 mg in sodium chloride 0.9 % 100 mL IVPB       See Hyperspace for full Linked Orders Report.   100 mg 200 mL/hr over 30 Minutes Intravenous Daily 11/22/20 0934 11/27/20 0959   11/22/20 1030  remdesivir 100 mg in sodium chloride 0.9 % 100 mL IVPB       See Hyperspace for full Linked Orders Report.   100 mg 200 mL/hr over 30 Minutes Intravenous Every 30 min 11/22/20 0934 11/22/20 1120       Subjective: Patient seen and evaluated today with no new acute complaints or concerns. No acute concerns or events noted overnight.  Objective: Vitals:   11/22/20 2200 11/22/20 2340 11/23/20 0000 11/23/20 0611  BP: 133/65  128/65 (!) 119/58  Pulse: 70  64 71  Resp: 17   16  Temp:  98.2 F (36.8 C)    TempSrc:  Oral    SpO2: 93%  95% 91%  Weight:      Height:        Intake/Output Summary (Last 24 hours) at 11/23/2020 0902 Last data filed at 11/22/2020 1200 Gross per 24 hour  Intake 440 ml  Output --  Net 440 ml   Filed Weights   11/19/20 1508 11/21/20 1802  Weight: 80.7 kg 76 kg    Examination:  General exam: Appears calm and comfortable  Respiratory system: Clear to auscultation. Respiratory effort normal. On 8L [place] Cardiovascular system: S1 & S2 heard, RRR.  Gastrointestinal system: Abdomen is soft Central nervous system: Alert and awake Extremities: No edema Skin: No significant lesions noted Psychiatry: Flat affect.    Data Reviewed: I have personally reviewed following labs and imaging studies  CBC: Recent Labs  Lab 11/19/20 1536 11/20/20 0638 11/21/20 0539 11/22/20 0313 11/23/20 0332  WBC 4.4 2.6* 8.2 7.6 6.5  NEUTROABS 2.9 1.5* 5.7 5.9 4.5  HGB 13.9 13.5 13.6 12.6 13.0  HCT 40.5 40.9 40.4 37.2 39.3  MCV 90.8 91.5 90.2 90.5 91.8  PLT 213 247 287 303  302   Basic Metabolic Panel: Recent Labs  Lab 11/19/20 1536 11/20/20 0638 11/21/20 0539 11/22/20 0313 11/23/20 0332  NA 130* 137 136 136 136  K 3.7 4.7 4.6 4.3 4.4  CL 103 105 105 106 106  CO2 20* 24 24 23 25   GLUCOSE 92 135* 151* 148* 131*  BUN 18 19 23 22 21   CREATININE 0.82 0.78 0.82 0.64 0.62  CALCIUM 8.2* 8.9 8.8* 8.6* 8.8*  MG  --  2.3 2.4 2.4 2.5*  PHOS  --  4.5 2.9 3.0 2.9   GFR: Estimated Creatinine Clearance: 62.9 mL/min (by C-G formula based on SCr of 0.62 mg/dL). Liver Function Tests: Recent Labs  Lab 11/19/20 1536 11/20/20 01/19/21 11/21/20 0539 11/22/20 0313 11/23/20 0332  AST 61* 53* 54* 39 56*  ALT 41 37 36 29 48*  ALKPHOS 42 41 40 36* 36*  BILITOT 0.6 0.7 0.8 0.7 0.6  PROT 7.1 7.1 7.0 6.5 6.5  ALBUMIN 3.5 3.3* 3.3* 3.0* 3.1*   No results for input(s): LIPASE, AMYLASE in the last 168 hours. No results for input(s): AMMONIA in the last 168 hours. Coagulation Profile: No results for input(s): INR, PROTIME in the last 168 hours. Cardiac Enzymes: No results for input(s): CKTOTAL, CKMB, CKMBINDEX, TROPONINI in the last 168 hours. BNP (last 3 results) No results for input(s): PROBNP in the last 8760 hours. HbA1C: No results for input(s): HGBA1C in the last 72 hours. CBG: No results for input(s): GLUCAP in the last 168 hours. Lipid Profile: No results for input(s): CHOL, HDL, LDLCALC, TRIG, CHOLHDL, LDLDIRECT in the last 72 hours. Thyroid Function Tests: No results for input(s): TSH, T4TOTAL, FREET4, T3FREE, THYROIDAB in the last 72 hours. Anemia Panel: Recent Labs    11/22/20 0313 11/23/20 0332  FERRITIN 441* 492*   Sepsis Labs: Recent Labs  Lab 11/19/20 1536  PROCALCITON <0.10    Recent Results (from the past 240 hour(s))  Resp Panel by RT-PCR (Flu A&B, Covid) Nasopharyngeal Swab     Status: Abnormal   Collection Time: 11/19/20  5:00 PM   Specimen: Nasopharyngeal Swab; Nasopharyngeal(NP) swabs in vial transport medium  Result Value Ref  Range Status   SARS Coronavirus 2 by RT PCR POSITIVE (A) NEGATIVE Final    Comment: RESULT CALLED TO, READ BACK BY AND VERIFIED WITH: SPENCE,H ON 11/19/20 BY JUW (NOTE) SARS-CoV-2 target nucleic acids are DETECTED.  The SARS-CoV-2 RNA is generally detectable in upper respiratory specimens during the acute phase of infection. Positive results are indicative of the presence of the identified virus, but do not rule out bacterial infection or co-infection with other pathogens not detected by the test. Clinical correlation with patient history and other diagnostic information is necessary to determine patient infection status. The  expected result is Negative.  Fact Sheet for Patients: BloggerCourse.comhttps://www.fda.gov/media/152166/download  Fact Sheet for Healthcare Providers: SeriousBroker.ithttps://www.fda.gov/media/152162/download  This test is not yet approved or cleared by the [place] FDA and  has been authorized for detection and/or diagnosis of SARS-CoV-2 by FDA under an Emergency Use Authorization (EUA).  This EUA will remain in effect (meaning this test can be used) f or the duration of  the COVID-19 declaration under Section 564(b)(1) of the Act, 21 U.S.C. section 360bbb-3(b)(1), unless the authorization is terminated or revoked sooner.     Influenza A by PCR NEGATIVE NEGATIVE Final   Influenza B by PCR NEGATIVE NEGATIVE Final    Comment: (NOTE) The Xpert Xpress SARS-CoV-2/FLU/RSV plus assay is intended as an aid in the diagnosis of influenza from Nasopharyngeal swab specimens and should not be used as a sole basis for treatment. Nasal washings and aspirates are unacceptable for Xpert Xpress SARS-CoV-2/FLU/RSV testing.  Fact Sheet for Patients: BloggerCourse.comhttps://www.fda.gov/media/152166/download  Fact Sheet for Healthcare Providers: SeriousBroker.ithttps://www.fda.gov/media/152162/download  This test is not yet approved or cleared by the [place] FDA and has been authorized for detection and/or diagnosis of  SARS-CoV-2 by FDA under an Emergency Use Authorization (EUA). This EUA will remain in effect (meaning this test can be used) for the duration of the COVID-19 declaration under Section 564(b)(1) of the Act, 21 U.S.C. section 360bbb-3(b)(1), unless the authorization is terminated or revoked.  Performed at [facility], [address] [place], [place] [address]   MRSA Next Gen by PCR, Nasal     Status: None   Collection Time: 11/21/20  6:50 PM   Specimen: Nasal Mucosa; Nasal Swab  Result Value Ref Range Status   MRSA by PCR Next Gen NOT DETECTED NOT DETECTED Final    Comment: (NOTE) The GeneXpert MRSA Assay (FDA approved for NASAL specimens only), is one component of a comprehensive MRSA colonization surveillance program. It is not intended to diagnose MRSA infection nor to guide or monitor treatment for MRSA infections. Test performance is not FDA approved in patients less than 71 years old. Performed at [facility], [address] [place], [place] [address]          [facility] [place] [facility]  Result Date: 11/21/2020 CLINICAL DATA:  Hypoxemia, COVID-19 positivity EXAM: PORTABLE CHEST 1 VIEW COMPARISON:  Film from earlier in the same day. FINDINGS: Cardiac shadow is stable. Aortic calcifications are again identified. Patchy bilateral airspace opacities are noted slightly improved on the left when compared with the prior exam. No new focal abnormality is noted. IMPRESSION: Slight improved aeration when compare with the prior exam. Electronically Signed   By: Alcide CleverMark  Lukens M.D.   On: 11/21/2020 19:12        Scheduled Meds:  ALPRAZolam  0.25 mg Oral QHS   vitamin C  500 mg Oral Daily   baricitinib  4 mg Oral Daily   Chlorhexidine Gluconate Cloth  6 each Topical Daily   enoxaparin (LOVENOX) injection  40 mg Subcutaneous Q24H   guaiFENesin  600 mg Oral BID   hydrocortisone  10 mg Oral q AM   hydrocortisone  5 mg Oral QPM   methylPREDNISolone (SOLU-MEDROL)  injection  40 mg Intravenous Q12H   polyvinyl alcohol  1 drop Both Eyes QID   thyroid  30 mg Oral QPM   thyroid  45 mg Oral Q0600   zinc sulfate  220 mg Oral Daily   Continuous Infusions:  remdesivir 100 mg in NS 100 mL  LOS: 4 days    Time spent: 35 minutes    Delmer Kowalski Hoover Brunette, DO [facility]  If 7PM-7AM, please contact night-coverage [email or web address] 11/23/2020, 9:02 AM

## 2020-11-23 NOTE — Telephone Encounter (Signed)
Pt is still admitted at [facility] for Covid.  Will sign this message off since she is still inpatient

## 2020-11-23 NOTE — Progress Notes (Signed)
Patient dropped her o2 sats to 79% on 10L HFNC. Attempted to increase to 14L but sats only increased to 83%. A non-rebreather at 15L was placed over the [place] and sats increased to 93%.   After patient was able to remove the non-rebreather. Currently on 12L HFNC with spo2 reading 92%

## 2020-11-23 NOTE — Progress Notes (Signed)
Patient sats dropped back to 83%  Patient assisted back to bed, assisted to a side/prone position. Spo2 increased back to 92

## 2020-11-24 ENCOUNTER — Inpatient Hospital Stay (HOSPITAL_COMMUNITY): Payer: Medicare Other

## 2020-11-24 DIAGNOSIS — J9601 Acute respiratory failure with hypoxia: Secondary | ICD-10-CM

## 2020-11-24 DIAGNOSIS — U071 COVID-19: Principal | ICD-10-CM

## 2020-11-24 DIAGNOSIS — J1282 Pneumonia due to coronavirus disease 2019: Secondary | ICD-10-CM

## 2020-11-24 LAB — D-DIMER, QUANTITATIVE: D-Dimer, Quant: 0.85 ug/mL-FEU — ABNORMAL HIGH (ref 0.00–0.50)

## 2020-11-24 LAB — CBC WITH DIFFERENTIAL/PLATELET
Abs Immature Granulocytes: 0.05 10*3/uL (ref 0.00–0.07)
Basophils Absolute: 0 10*3/uL (ref 0.0–0.1)
Basophils Relative: 0 %
Eosinophils Absolute: 0 10*3/uL (ref 0.0–0.5)
Eosinophils Relative: 0 %
HCT: 40.3 % (ref 36.0–46.0)
Hemoglobin: 13.5 g/dL (ref 12.0–15.0)
Immature Granulocytes: 1 %
Lymphocytes Relative: 15 %
Lymphs Abs: 1.3 10*3/uL (ref 0.7–4.0)
MCH: 30 pg (ref 26.0–34.0)
MCHC: 33.5 g/dL (ref 30.0–36.0)
MCV: 89.6 fL (ref 80.0–100.0)
Monocytes Absolute: 0.9 10*3/uL (ref 0.1–1.0)
Monocytes Relative: 11 %
Neutro Abs: 6.3 10*3/uL (ref 1.7–7.7)
Neutrophils Relative %: 73 %
Platelets: 327 10*3/uL (ref 150–400)
RBC: 4.5 MIL/uL (ref 3.87–5.11)
RDW: 12.3 % (ref 11.5–15.5)
WBC: 8.6 10*3/uL (ref 4.0–10.5)
nRBC: 0 % (ref 0.0–0.2)

## 2020-11-24 LAB — COMPREHENSIVE METABOLIC PANEL
ALT: 50 U/L — ABNORMAL HIGH (ref 0–44)
AST: 48 U/L — ABNORMAL HIGH (ref 15–41)
Albumin: 3 g/dL — ABNORMAL LOW (ref 3.5–5.0)
Alkaline Phosphatase: 36 U/L — ABNORMAL LOW (ref 38–126)
Anion gap: 7 (ref 5–15)
BUN: 18 mg/dL (ref 8–23)
CO2: 26 mmol/L (ref 22–32)
Calcium: 8.9 mg/dL (ref 8.9–10.3)
Chloride: 104 mmol/L (ref 98–111)
Creatinine, Ser: 0.62 mg/dL (ref 0.44–1.00)
GFR, Estimated: >60 mL/min (ref >60)
Glucose, Bld: 140 mg/dL — ABNORMAL HIGH (ref 70–99)
Potassium: 4.3 mmol/L (ref 3.5–5.1)
Sodium: 137 mmol/L (ref 135–145)
Total Bilirubin: 0.7 mg/dL (ref 0.3–1.2)
Total Protein: 6.4 g/dL — ABNORMAL LOW (ref 6.5–8.1)

## 2020-11-24 LAB — MAGNESIUM: Magnesium: 2.4 mg/dL (ref 1.7–2.4)

## 2020-11-24 LAB — PHOSPHORUS: Phosphorus: 3 mg/dL (ref 2.5–4.6)

## 2020-11-24 LAB — C-REACTIVE PROTEIN: CRP: 0.5 mg/dL (ref <1.0)

## 2020-11-24 LAB — FERRITIN: Ferritin: 573 ng/mL — ABNORMAL HIGH (ref 11–307)

## 2020-11-24 MED ORDER — PANTOPRAZOLE SODIUM 40 MG PO TBEC
40.0000 mg | DELAYED_RELEASE_TABLET | Freq: Two times a day (BID) | ORAL | Status: DC
  Administered 2020-11-24 – 2020-11-28 (×8): 40 mg via ORAL
  Filled 2020-11-24 (×8): qty 1

## 2020-11-24 NOTE — Progress Notes (Signed)
PROGRESS NOTE    Monica Harrison  JIR:[id number] DOB: [birth year] DOA: 11/19/2020 PCP: Oneita Hurt, No   Brief Narrative:   Monica Harrison is a 70 y.o. female with medical history significant for adrenal insufficiency on chronic steroids.  Patient presented to the ED with complaints of generalized weakness, poor oral intake with nausea, cough and some difficulty breathing. Patient tested positive for COVID 8/4.  She reports her O2 sats were down to the 80s at home.  She subsequently called her outpatient provider and was sent a prescription 8/5 for Molnupiravir, but she says the pharmacy did not have this medication available. She presented to ED with hypoxia. CXR shows patchy bilateral  pulm infiltrates compatible with COVID pneumonia. Her oxygen requirements continue to increase. Pulmonology consultation pending.  Assessment & Plan:   Principal Problem:   Pneumonia due to COVID-19 virus Active Problems:   Adrenal insufficiency (HCC)   Acute hypoxemic respiratory failure (HCC)   Acute Respiratory failure with hypoxia due to COVID 19 Pneumonia-worsening -Pt is unvaccinated -Currently requiring 10 L nasal cannula oxygen and therefore transition to stepdown unit on 8/9 -Started on remdesivir 8/10 -Continue IV Solu-Medrol -Started on baricitinib 8/10 -Monitor inflammatory markers which are downtrending -Chest x-ray with no acute abnormalities on 8/9, recheck today -Wean oxygen as tolerated, now to 8L   Adrenal insufficiency: Resume home hydrocortisone   Hypothyroidism Resume home thyroid medication     DVT prophylaxis: Lovenox Code Status: Full Family Communication: Discussed with husband on phone 8/12 Disposition Plan:  Status is: Inpatient   Remains inpatient appropriate because:IV treatments appropriate due to intensity of illness or inability to take PO and Inpatient level of care appropriate due to severity of illness   Dispo: The patient is from: Home              Anticipated  d/c is to: Home              Patient currently is not medically stable to d/c.              Difficult to place patient No     Consultants:  None   Procedures:  See below  Antimicrobials:  Anti-infectives (From admission, onward)    Start     Dose/Rate Route Frequency Ordered Stop   11/23/20 1000  remdesivir 100 mg in sodium chloride 0.9 % 100 mL IVPB       See Hyperspace for full Linked Orders Report.   100 mg 200 mL/hr over 30 Minutes Intravenous Daily 11/22/20 0934 11/27/20 0959   11/22/20 1030  remdesivir 100 mg in sodium chloride 0.9 % 100 mL IVPB       See Hyperspace for full Linked Orders Report.   100 mg 200 mL/hr over 30 Minutes Intravenous Every 30 min 11/22/20 0934 11/22/20 1120      Subjective: Patient seen and evaluated today with no new acute complaints or concerns. No acute concerns or events noted overnight. She denies any dyspnea or chest pain, but her oxygen requirements are increasing.  Objective: Vitals:   11/24/20 0800 11/24/20 0900 11/24/20 0908 11/24/20 0925  BP: (!) 113/41 (!) 115/59    Pulse: 68 80 71 65  Resp: (!) 22 19 17 14   Temp: 98.8 F (37.1 C)     TempSrc:      SpO2: 93% (!) 89% 92% 96%  Weight:      Height:        Intake/Output Summary (Last 24 hours) at 11/24/2020 1219 Last data filed  at 11/23/2020 1820 Gross per 24 hour  Intake 100 ml  Output --  Net 100 ml   Filed Weights   11/19/20 1508 11/21/20 1802  Weight: 80.7 kg 76 kg    Examination:  General exam: Appears calm and comfortable  Respiratory system: Clear to auscultation. Respiratory effort normal. On 13 L [place]. Cardiovascular system: S1 & S2 heard, RRR.  Gastrointestinal system: Abdomen is soft Central nervous system: Alert and awake Extremities: No edema Skin: No significant lesions noted Psychiatry: Flat affect.    Data Reviewed: I have personally reviewed following labs and imaging studies  CBC: Recent Labs  Lab 11/20/20 0638 11/21/20 0539 11/22/20 0313  11/23/20 0332 11/24/20 0414  WBC 2.6* 8.2 7.6 6.5 8.6  NEUTROABS 1.5* 5.7 5.9 4.5 6.3  HGB 13.5 13.6 12.6 13.0 13.5  HCT 40.9 40.4 37.2 39.3 40.3  MCV 91.5 90.2 90.5 91.8 89.6  PLT 247 287 303 302 327   Basic Metabolic Panel: Recent Labs  Lab 11/20/20 0638 11/21/20 0539 11/22/20 0313 11/23/20 0332 11/24/20 0414  NA 137 136 136 136 137  K 4.7 4.6 4.3 4.4 4.3  CL 105 105 106 106 104  CO2 24 24 23 25 26   GLUCOSE 135* 151* 148* 131* 140*  BUN 19 23 22 21 18   CREATININE 0.78 0.82 0.64 0.62 0.62  CALCIUM 8.9 8.8* 8.6* 8.8* 8.9  MG 2.3 2.4 2.4 2.5* 2.4  PHOS 4.5 2.9 3.0 2.9 3.0   GFR: Estimated Creatinine Clearance: 62.9 mL/min (by C-G formula based on SCr of 0.62 mg/dL). Liver Function Tests: Recent Labs  Lab 11/20/20 11/21/20 0539 11/22/20 0313 11/23/20 0332 11/24/20 0414  AST 53* 54* 39 56* 48*  ALT 37 36 29 48* 50*  ALKPHOS 41 40 36* 36* 36*  BILITOT 0.7 0.8 0.7 0.6 0.7  PROT 7.1 7.0 6.5 6.5 6.4*  ALBUMIN 3.3* 3.3* 3.0* 3.1* 3.0*   No results for input(s): LIPASE, AMYLASE in the last 168 hours. No results for input(s): AMMONIA in the last 168 hours. Coagulation Profile: No results for input(s): INR, PROTIME in the last 168 hours. Cardiac Enzymes: No results for input(s): CKTOTAL, CKMB, CKMBINDEX, TROPONINI in the last 168 hours. BNP (last 3 results) No results for input(s): PROBNP in the last 8760 hours. HbA1C: No results for input(s): HGBA1C in the last 72 hours. CBG: No results for input(s): GLUCAP in the last 168 hours. Lipid Profile: No results for input(s): CHOL, HDL, LDLCALC, TRIG, CHOLHDL, LDLDIRECT in the last 72 hours. Thyroid Function Tests: No results for input(s): TSH, T4TOTAL, FREET4, T3FREE, THYROIDAB in the last 72 hours. Anemia Panel: Recent Labs    11/23/20 0332 11/24/20 0414  FERRITIN 492* 573*   Sepsis Labs: Recent Labs  Lab 11/19/20 1536  PROCALCITON <0.10    Recent Results (from the past 240 hour(s))  Resp Panel by  RT-PCR (Flu A&B, Covid) Nasopharyngeal Swab     Status: Abnormal   Collection Time: 11/19/20  5:00 PM   Specimen: Nasopharyngeal Swab; Nasopharyngeal(NP) swabs in vial transport medium  Result Value Ref Range Status   SARS Coronavirus 2 by RT PCR POSITIVE (A) NEGATIVE Final    Comment: RESULT CALLED TO, READ BACK BY AND VERIFIED WITH: SPENCE,H ON 11/19/20 BY JUW (NOTE) SARS-CoV-2 target nucleic acids are DETECTED.  The SARS-CoV-2 RNA is generally detectable in upper respiratory specimens during the acute phase of infection. Positive results are indicative of the presence of the identified virus, but do not rule out bacterial infection or  co-infection with other pathogens not detected by the test. Clinical correlation with patient history and other diagnostic information is necessary to determine patient infection status. The expected result is Negative.  Fact Sheet for Patients: BloggerCourse.com  Fact Sheet for Healthcare Providers: SeriousBroker.it  This test is not yet approved or cleared by the [place] FDA and  has been authorized for detection and/or diagnosis of SARS-CoV-2 by FDA under an Emergency Use Authorization (EUA).  This EUA will remain in effect (meaning this test can be used) f or the duration of  the COVID-19 declaration under Section 564(b)(1) of the Act, 21 U.S.C. section 360bbb-3(b)(1), unless the authorization is terminated or revoked sooner.     Influenza A by PCR NEGATIVE NEGATIVE Final   Influenza B by PCR NEGATIVE NEGATIVE Final    Comment: (NOTE) The Xpert Xpress SARS-CoV-2/FLU/RSV plus assay is intended as an aid in the diagnosis of influenza from Nasopharyngeal swab specimens and should not be used as a sole basis for treatment. Nasal washings and aspirates are unacceptable for Xpert Xpress SARS-CoV-2/FLU/RSV testing.  Fact Sheet for Patients: BloggerCourse.com  Fact  Sheet for Healthcare Providers: SeriousBroker.it  This test is not yet approved or cleared by the [place] FDA and has been authorized for detection and/or diagnosis of SARS-CoV-2 by FDA under an Emergency Use Authorization (EUA). This EUA will remain in effect (meaning this test can be used) for the duration of the COVID-19 declaration under Section 564(b)(1) of the Act, 21 U.S.C. section 360bbb-3(b)(1), unless the authorization is terminated or revoked.  Performed at [facility], [address] [place], [place] [address]   MRSA Next Gen by PCR, Nasal     Status: None   Collection Time: 11/21/20  6:50 PM   Specimen: Nasal Mucosa; Nasal Swab  Result Value Ref Range Status   MRSA by PCR Next Gen NOT DETECTED NOT DETECTED Final    Comment: (NOTE) The GeneXpert MRSA Assay (FDA approved for NASAL specimens only), is one component of a comprehensive MRSA colonization surveillance program. It is not intended to diagnose MRSA infection nor to guide or monitor treatment for MRSA infections. Test performance is not FDA approved in patients less than 59 years old. Performed at [facility], [address] [place], [place] [address]          Radiology Studies: No results found.      Scheduled Meds:  ALPRAZolam  0.25 mg Oral QHS   vitamin C  500 mg Oral Daily   baricitinib  4 mg Oral Daily   Chlorhexidine Gluconate Cloth  6 each Topical Daily   enoxaparin (LOVENOX) injection  40 mg Subcutaneous Q24H   guaiFENesin  600 mg Oral BID   hydrocortisone  10 mg Oral q AM   hydrocortisone  5 mg Oral QPM   methylPREDNISolone (SOLU-MEDROL) injection  40 mg Intravenous Q12H   polyvinyl alcohol  1 drop Both Eyes QID   thyroid  30 mg Oral QPM   thyroid  45 mg Oral Q0600   zinc sulfate  220 mg Oral Daily   Continuous Infusions:  remdesivir 100 mg in NS 100 mL 100 mg (11/24/20 1017)     LOS: 5 days    Time spent: 35 minutes    Mackey Varricchio Hoover Brunette,  DO [facility]  If 7PM-7AM, please contact night-coverage [email or web address] 11/24/2020, 12:19 PM

## 2020-11-24 NOTE — Plan of Care (Signed)

## 2020-11-24 NOTE — Progress Notes (Signed)
Pt showed BBB on monitor. EKG performed and confirmed RBBB. Physical copy in chart. Attending notified. Pt denies CP or any other new symptoms. VSS. Pt current O2 sat 95% on 10L HFNC.

## 2020-11-24 NOTE — Consult Note (Signed)
NAME:  Monica Harrison, MRN:  [id number], DOB:  Dec 04, 1950, LOS: 5 ADMISSION DATE:  11/19/2020, CONSULTATION DATE:  11/24/20 REFERRING MD:  Manuella Ghazi, [facility], CHIEF COMPLAINT:  resp failure/ covid 19 pna with symptom onset July 31  History of Present Illness:  16 yowf never smoker but never  vax  with medical history significant for adrenal insufficiency on chronic steroids eval by Dr Vaughan Browner  05/03/20 for sob ? Asthma but  eval neg and prior spirometry nl in 09/2015  .  Patient presented to the ED  8/7 with complaints of  1 week of generalized weakness, poor oral intake with nausea, cough and some difficulty breathing.  She reports some mild diarrhea and tested positive for COVID 8/4.  She reports her O2 sats were down to the 80s at home.  She subsequently called her outpatient provider and was sent a prescription 8/5 for Molnupiravir, but she says the pharmacy did not have this medication available.  She was also prescribed Augmentin which she took.  She reports that she is allergic to several things including vaccines, so she did not get COVID-19 vaccine.  She has not had any known COVID-19 infection since onset of COVID 19 the pandemic.   ED Course: O2 sat 83% on room air, sats greater than 92 % on 3 L.  Temperature 97.9.  Heart rate 102.  Respiratory rate 17-19.  Chest x-ray shows interval development of patchy bilateral pulmonary infiltrates compatible with COVID-pneumonia.  Hospitalist to admit.     Pertinent  Medical History  Adrenal insufficiency  Hypothyroid   Significant Hospital Events: Including procedures, antibiotic start and stop dates in addition to other pertinent events   Augmentin as outpt Remdesivir 8/10 >>> Baricitiib 8/10 >>>   Scheduled Meds:  ALPRAZolam  0.25 mg Oral QHS   vitamin C  500 mg Oral Daily   baricitinib  4 mg Oral Daily   Chlorhexidine Gluconate Cloth  6 each Topical Daily   enoxaparin (LOVENOX) injection  40 mg Subcutaneous Q24H   guaiFENesin  600 mg Oral BID    hydrocortisone  10 mg Oral q AM   hydrocortisone  5 mg Oral QPM   methylPREDNISolone (SOLU-MEDROL) injection  40 mg Intravenous Q12H   polyvinyl alcohol  1 drop Both Eyes QID   thyroid  30 mg Oral QPM   thyroid  45 mg Oral Q0600   zinc sulfate  220 mg Oral Daily   Continuous Infusions:  remdesivir 100 mg in NS 100 mL 100 mg (11/24/20 1017)   PRN Meds:.albuterol, guaiFENesin-dextromethorphan, ondansetron **OR** ondansetron (ZOFRAN) IV, polyethylene glycol, traMADol   Interim History / Subjective:  No cp / cough with minimal mucoid sputum, no blood   Objective   Blood pressure (!) 115/59, pulse 65, temperature 98.8 F (37.1 C), resp. rate 14, height _0  (1.6 m), weight 76 kg, SpO2 96 %.        Intake/Output Summary (Last 24 hours) at 11/24/2020 1248 Last data filed at 11/23/2020 1820 Gross per 24 hour  Intake 100 ml  Output --  Net 100 ml   Filed Weights   11/19/20 1508 11/21/20 1802  Weight: 80.7 kg 76 kg    Examination: Tmax 98.8 / Note at rest 02 sats  98% on 13lpm HFNC  General appearance:    does not appear toxic/no increased wob   HENT: orophx clear  Lungs: minimal insp rhonchi,  no exp wheeze  Cardiovascular: RRR NSR no m/g Abdomen: soft/ benign Extremities: warm s  Cyanosis / clubbing or edema Neuro: alert and approp     I personally reviewed images /impression as follows:  CXR:   portable 8/12 As dz bilaterally  is only mild and not changed from priors      Assessment & Plan:  1) Acute covid 19 pna > hypoxemic resp failure dep on high flow nasal 02 - she is about 12 days into the illness and hopefully the gas exchange problem has peaked with nothing to suggest PE (d dimer remains suprisingly low for a covid pt)  - if worse might bump the solumedrol back to 80 q12 h p first check ESR for baseline and intubate as last resort  2) Very mild asthma by hx, nl baseline pfts >  rx steroids, prn saba hfa/ and ppi bid ac   PCCM f/u is prn    Labs    CBC: Recent Labs  Lab 11/20/20 0638 11/21/20 0539 11/22/20 0313 11/23/20 0332 11/24/20 0414  WBC 2.6* 8.2 7.6 6.5 8.6  NEUTROABS 1.5* 5.7 5.9 4.5 6.3  HGB 13.5 13.6 12.6 13.0 13.5  HCT 40.9 40.4 37.2 39.3 40.3  MCV 91.5 90.2 90.5 91.8 89.6  PLT 247 287 303 302 076    Basic Metabolic Panel: Recent Labs  Lab 11/20/20 0638 11/21/20 0539 11/22/20 0313 11/23/20 0332 11/24/20 0414  NA 137 136 136 136 137  K 4.7 4.6 4.3 4.4 4.3  CL 105 105 106 106 104  CO2 _0 GLUCOSE 135* 151* 148* 131* 140*  BUN _1 CREATININE 0.78 0.82 0.64 0.62 0.62  CALCIUM 8.9 8.8* 8.6* 8.8* 8.9  MG 2.3 2.4 2.4 2.5* 2.4  PHOS 4.5 2.9 3.0 2.9 3.0   GFR: Estimated Creatinine Clearance: 62.9 mL/min (by C-G formula based on SCr of 0.62 mg/dL). Recent Labs  Lab 11/19/20 1536 11/20/20 2263 11/21/20 0539 11/22/20 0313 11/23/20 0332 11/24/20 0414  PROCALCITON <0.10  --   --   --   --   --   WBC 4.4   < > 8.2 7.6 6.5 8.6   < > = values in this interval not displayed.    Liver Function Tests: Recent Labs  Lab 11/20/20 3354 11/21/20 0539 11/22/20 0313 11/23/20 0332 11/24/20 0414  AST 53* 54* 39 56* 48*  ALT 37 36 29 48* 50*  ALKPHOS 41 40 36* 36* 36*  BILITOT 0.7 0.8 0.7 0.6 0.7  PROT 7.1 7.0 6.5 6.5 6.4*  ALBUMIN 3.3* 3.3* 3.0* 3.1* 3.0*   No results for input(s): LIPASE, AMYLASE in the last 168 hours. No results for input(s): AMMONIA in the last 168 hours.  ABG    Component Value Date/Time   PHART 7.486 (H) 11/21/2020 1800   PCO2ART 28.1 (L) 11/21/2020 1800   PO2ART 75.2 (L) 11/21/2020 1800   HCO3 23.6 11/21/2020 1800   ACIDBASEDEF 2.0 11/21/2020 1800   O2SAT 96.3 11/21/2020 1800     Coagulation Profile: No results for input(s): INR, PROTIME in the last 168 hours.  Cardiac Enzymes: No results for input(s): CKTOTAL, CKMB, CKMBINDEX, TROPONINI in the last 168 hours.  HbA1C: No results found for: HGBA1C  CBG: No results for input(s): GLUCAP in the  last 168 hours.     Past Medical History:  She,  has a past medical history of Adrenal insufficiency ([place]) and Thyroid disease.   Surgical History:  History reviewed. No pertinent surgical history.   Social History:   reports that she has never  smoked. She has never used smokeless tobacco. She reports that she does not drink alcohol and does not use drugs.   Family History:  Her family history is not on file.   Allergies Allergies  Allergen Reactions   Avelox [Moxifloxacin Hcl In Nacl] Anaphylaxis   Biaxin [Clarithromycin] Anaphylaxis   Lactose Intolerance (Gi)     Bloating, gas, congestion   Sulfa Antibiotics Other (See Comments)    Fatigue    Tobramycin     Severe headaches    Yeast-Related Products Swelling    Tongue swelling /candida     Home Medications  Prior to Admission medications   Medication Sig Start Date End Date Taking? Authorizing Provider  albuterol (PROVENTIL) (2.5 MG/3ML) 0.083% nebulizer solution Take 2.5 mg by nebulization every 6 (six) hours as needed for wheezing or shortness of breath.   Yes [provider]  albuterol (VENTOLIN HFA) 108 (90 Base) MCG/ACT inhaler Inhale 2 puffs into the lungs every 6 (six) hours as needed for wheezing or shortness of breath. 11/16/20  Yes Mannam, Praveen, MD  ALPRAZolam (XANAX) 0.25 MG tablet Take 0.25 mg by mouth at bedtime.   Yes [provider]  Ascorbic Acid (VITAMIN C) 1000 MG tablet Take 2,000 mg by mouth 2 (two) times daily.   Yes [provider]  B Complex Vitamins (B COMPLEX 50 PO) Take 1 tablet by mouth daily.    Yes [provider]  benzonatate (TESSALON) 200 MG capsule Take 1 capsule (200 mg total) by mouth 3 (three) times daily as needed for cough. 11/15/20  Yes Martyn Ehrich, NP  budesonide (RHINOCORT AQUA) 32 MCG/ACT nasal spray Place 1 spray into both nostrils daily as needed for rhinitis.   Yes [provider]  Calcium-Magnesium-Zinc (CAL-MAG-ZINC PO) Take 2  tablets by mouth at bedtime.   Yes [provider]  Cholecalciferol (VITAMIN D3) 5000 units CAPS Take 15,000 Units by mouth at bedtime.   Yes [provider]  Coenzyme Q10 (CO Q 10) 100 MG CAPS Take 100 mg by mouth every evening.    Yes [provider]  Digestive Enzymes (ENZYME DIGEST) CAPS Take 1 capsule by mouth 2 (two) times daily with a meal.    Yes [provider]  hydrocortisone (CORTEF) 5 MG tablet Take 5 mg by mouth See admin instructions. TAKE 10MG IN THE MORNING AND 5 MG IN THE AFTERNOON.   Yes [provider]  ibuprofen (ADVIL,MOTRIN) 200 MG tablet Take 400 mg by mouth every 6 (six) hours as needed for moderate pain.   Yes [provider]  Magnesium 400 MG TABS Take 400 mg by mouth at bedtime.   Yes [provider]  Multiple Vitamin (MULTIVITAMIN WITH MINERALS) TABS tablet Take 3 tablets by mouth daily.    Yes [provider]  Omega-3 Fatty Acids (OMEGA 3 PO) Take 2 capsules by mouth 2 (two) times daily.   Yes [provider]  Polyethyl Glycol-Propyl Glycol 0.4-0.3 % SOLN Place 1 drop into both eyes 4 (four) times daily.    Yes [provider]  Probiotic Product (PROBIOTIC PO) Take 1 capsule by mouth every evening.    Yes [provider]  thyroid (ARMOUR) 30 MG tablet Take 45 mg every morning and 30 mg every afternoon   Yes [provider]  vitamin A 10000 UNIT capsule Take 10,000 Units by mouth every evening.    Yes [provider]  amoxicillin-clavulanate (AUGMENTIN) 875-125 MG tablet Take 1 tablet by mouth  2 (two) times daily. Patient not taking: No sig reported 08/28/20   Mannam, Hart Robinsons, MD  estradiol (ESTRACE) 1 MG tablet Take 0.5 mg by mouth every other day. Patient not taking: Reported on 11/17/2020    [provider]  fluconazole (DIFLUCAN) 100 MG tablet Take 1 tablet (100 mg total) by mouth daily. Patient not taking: No sig reported 08/28/20   Marshell Garfinkel, MD  Misc Natural Products (IMMUNE FORMULA PO) Take 1 tablet by mouth 4 (four) times a week. Patient not taking: Reported on 11/17/2020    [provider]  PRESCRIPTION MEDICATION Apply 1 application topically 2 (two) times a week. COMPOUNDED TESTOSTERONE CREAM - [facility].  Monday and Fridays Patient not taking: No sig reported    [provider]  PRESCRIPTION MEDICATION Apply 1 Pump topically at bedtime. COMPOUNDED PROGESTERONE CREAM - [facility]. Patient not taking: Reported on 11/17/2020    [provider]  thyroid (ARMOUR) 32.5 MG tablet Take 32.5 mg by mouth daily at 2 PM.  Patient not taking: Reported on 11/19/2020    [provider]  Thyroid 48.75 MG TABS Take 48.75 tablets by mouth daily.  Patient not taking: Reported on 11/19/2020    [provider]       Christinia Gully, MD [facility] [place] [phone]   After 7:00 pm call Elink  [phone]

## 2020-11-25 LAB — COMPREHENSIVE METABOLIC PANEL
ALT: 47 U/L — ABNORMAL HIGH (ref 0–44)
AST: 37 U/L (ref 15–41)
Albumin: 2.9 g/dL — ABNORMAL LOW (ref 3.5–5.0)
Alkaline Phosphatase: 33 U/L — ABNORMAL LOW (ref 38–126)
Anion gap: 3 — ABNORMAL LOW (ref 5–15)
BUN: 18 mg/dL (ref 8–23)
CO2: 25 mmol/L (ref 22–32)
Calcium: 8.7 mg/dL — ABNORMAL LOW (ref 8.9–10.3)
Chloride: 108 mmol/L (ref 98–111)
Creatinine, Ser: 0.6 mg/dL (ref 0.44–1.00)
GFR, Estimated: >60 mL/min (ref >60)
Glucose, Bld: 154 mg/dL — ABNORMAL HIGH (ref 70–99)
Potassium: 3.9 mmol/L (ref 3.5–5.1)
Sodium: 136 mmol/L (ref 135–145)
Total Bilirubin: 0.6 mg/dL (ref 0.3–1.2)
Total Protein: 6.2 g/dL — ABNORMAL LOW (ref 6.5–8.1)

## 2020-11-25 LAB — CBC
HCT: 36.3 % (ref 36.0–46.0)
Hemoglobin: 12.8 g/dL (ref 12.0–15.0)
MCH: 31.4 pg (ref 26.0–34.0)
MCHC: 35.3 g/dL (ref 30.0–36.0)
MCV: 89.2 fL (ref 80.0–100.0)
Platelets: 318 10*3/uL (ref 150–400)
RBC: 4.07 MIL/uL (ref 3.87–5.11)
RDW: 12.1 % (ref 11.5–15.5)
WBC: 8.8 10*3/uL (ref 4.0–10.5)
nRBC: 0 % (ref 0.0–0.2)

## 2020-11-25 LAB — MAGNESIUM: Magnesium: 2.3 mg/dL (ref 1.7–2.4)

## 2020-11-25 LAB — C-REACTIVE PROTEIN: CRP: 1.6 mg/dL — ABNORMAL HIGH (ref <1.0)

## 2020-11-25 LAB — SEDIMENTATION RATE: Sed Rate: 24 mm/hr — ABNORMAL HIGH (ref 0–22)

## 2020-11-25 NOTE — Progress Notes (Signed)
PROGRESS NOTE    Monica Harrison  GEZ:[id number] DOB: 06/10/1950 DOA: 11/19/2020 PCP: Merryl Hacker, No   Brief Narrative:   Monica Harrison is a 70 y.o. female with medical history significant for adrenal insufficiency on chronic steroids.  Patient presented to the ED with complaints of generalized weakness, poor oral intake with nausea, cough and some difficulty breathing. Patient tested positive for COVID 8/4.  She reports her O2 sats were down to the 80s at home.  She subsequently called her outpatient provider and was sent a prescription 8/5 for Molnupiravir, but she says the pharmacy did not have this medication available. She presented to ED with hypoxia. CXR shows patchy bilateral  pulm infiltrates compatible with COVID pneumonia. Her oxygen requirements continue to increase. Pulmonology consultation with recommendations to continue treatment as prescribed and consider increasing steroids as needed.  Assessment & Plan:   Principal Problem:   Pneumonia due to COVID-19 virus Active Problems:   Adrenal insufficiency (HCC)   Acute hypoxemic respiratory failure (HCC)   Acute Respiratory failure with hypoxia due to COVID 19 Pneumonia-worsening -Pt is unvaccinated -Currently requiring 10 L nasal cannula oxygen and therefore transition to stepdown unit on 8/9 -Started on remdesivir 8/10 -Continue IV Solu-Medrol -Started on baricitinib 8/10 -Monitor inflammatory markers which are downtrending -Chest x-ray 8/12 with stable patchy bilateral opacities -Wean oxygen as tolerated, now to 10L -Monitor ESR and CRP daily and see if IV Solu-Medrol needs to be increased to 80 mg twice daily   Adrenal insufficiency: Resume home hydrocortisone   Hypothyroidism Resume home thyroid medication  Constipation -MiraLAX prescribed as needed     DVT prophylaxis: Lovenox Code Status: Full Family Communication: Discussed with husband on phone 8/13 Disposition Plan:  Status is: Inpatient   Remains  inpatient appropriate because:IV treatments appropriate due to intensity of illness or inability to take PO and Inpatient level of care appropriate due to severity of illness   Dispo: The patient is from: Home              Anticipated d/c is to: Home              Patient currently is not medically stable to d/c.              Difficult to place patient No     Consultants:  None   Procedures:  See below  Antimicrobials:  Anti-infectives (From admission, onward)    Start     Dose/Rate Route Frequency Ordered Stop   11/23/20 1000  remdesivir 100 mg in sodium chloride 0.9 % 100 mL IVPB       See Hyperspace for full Linked Orders Report.   100 mg 200 mL/hr over 30 Minutes Intravenous Daily 11/22/20 0934 11/27/20 0959   11/22/20 1030  remdesivir 100 mg in sodium chloride 0.9 % 100 mL IVPB       See Hyperspace for full Linked Orders Report.   100 mg 200 mL/hr over 30 Minutes Intravenous Every 30 min 11/22/20 0934 11/22/20 1120       Subjective: Patient seen and evaluated today with no new acute complaints or concerns. No acute concerns or events noted overnight.  She continues to remain on 10 L nasal cannula oxygen.  She has not had a bowel movement in 1 week.  Objective: Vitals:   11/25/20 0700 11/25/20 0800 11/25/20 0817 11/25/20 0900  BP: 95/61 (!) 112/52  (!) 119/57  Pulse: 65 (!) 59  83  Resp: '16 15  16  ' Temp:  98.6 F (37 C)    TempSrc:  Oral    SpO2: 90% 96% 95% (!) 88%  Weight:      Height:        Intake/Output Summary (Last 24 hours) at 11/25/2020 1044 Last data filed at 11/25/2020 0600 Gross per 24 hour  Intake 340 ml  Output 2300 ml  Net -1960 ml   Filed Weights   11/19/20 1508 11/21/20 1802  Weight: 80.7 kg 76 kg    Examination:  General exam: Appears calm and comfortable  Respiratory system: Clear to auscultation. Respiratory effort normal.  Currently on 10 L nasal cannula oxygen. Cardiovascular system: S1 & S2 heard, RRR.  Gastrointestinal system:  Abdomen is soft Central nervous system: Alert and awake Extremities: No edema Skin: No significant lesions noted Psychiatry: Flat affect.    Data Reviewed: I have personally reviewed following labs and imaging studies  CBC: Recent Labs  Lab 11/20/20 0638 11/21/20 0539 11/22/20 0313 11/23/20 0332 11/24/20 0414 11/25/20 0417  WBC 2.6* 8.2 7.6 6.5 8.6 8.8  NEUTROABS 1.5* 5.7 5.9 4.5 6.3  --   HGB 13.5 13.6 12.6 13.0 13.5 12.8  HCT 40.9 40.4 37.2 39.3 40.3 36.3  MCV 91.5 90.2 90.5 91.8 89.6 89.2  PLT 247 287 303 302 327 546   Basic Metabolic Panel: Recent Labs  Lab 11/20/20 0638 11/21/20 0539 11/22/20 0313 11/23/20 0332 11/24/20 0414 11/25/20 0417  NA 137 136 136 136 137 136  K 4.7 4.6 4.3 4.4 4.3 3.9  CL 105 105 106 106 104 108  CO2 '24 24 23 25 26 25  ' GLUCOSE 135* 151* 148* 131* 140* 154*  BUN '19 23 22 21 18 18  ' CREATININE 0.78 0.82 0.64 0.62 0.62 0.60  CALCIUM 8.9 8.8* 8.6* 8.8* 8.9 8.7*  MG 2.3 2.4 2.4 2.5* 2.4 2.3  PHOS 4.5 2.9 3.0 2.9 3.0  --    GFR: Estimated Creatinine Clearance: 62.9 mL/min (by C-G formula based on SCr of 0.6 mg/dL). Liver Function Tests: Recent Labs  Lab 11/21/20 0539 11/22/20 0313 11/23/20 0332 11/24/20 0414 11/25/20 0417  AST 54* 39 56* 48* 37  ALT 36 29 48* 50* 47*  ALKPHOS 40 36* 36* 36* 33*  BILITOT 0.8 0.7 0.6 0.7 0.6  PROT 7.0 6.5 6.5 6.4* 6.2*  ALBUMIN 3.3* 3.0* 3.1* 3.0* 2.9*   No results for input(s): LIPASE, AMYLASE in the last 168 hours. No results for input(s): AMMONIA in the last 168 hours. Coagulation Profile: No results for input(s): INR, PROTIME in the last 168 hours. Cardiac Enzymes: No results for input(s): CKTOTAL, CKMB, CKMBINDEX, TROPONINI in the last 168 hours. BNP (last 3 results) No results for input(s): PROBNP in the last 8760 hours. HbA1C: No results for input(s): HGBA1C in the last 72 hours. CBG: No results for input(s): GLUCAP in the last 168 hours. Lipid Profile: No results for input(s):  CHOL, HDL, LDLCALC, TRIG, CHOLHDL, LDLDIRECT in the last 72 hours. Thyroid Function Tests: No results for input(s): TSH, T4TOTAL, FREET4, T3FREE, THYROIDAB in the last 72 hours. Anemia Panel: Recent Labs    11/23/20 0332 11/24/20 0414  FERRITIN 492* 573*   Sepsis Labs: Recent Labs  Lab 11/19/20 1536  PROCALCITON <0.10    Recent Results (from the past 240 hour(s))  Resp Panel by RT-PCR (Flu A&B, Covid) Nasopharyngeal Swab     Status: Abnormal   Collection Time: 11/19/20  5:00 PM   Specimen: Nasopharyngeal Swab; Nasopharyngeal(NP) swabs in vial transport medium  Result Value Ref Range  Status   SARS Coronavirus 2 by RT PCR POSITIVE (A) NEGATIVE Final    Comment: RESULT CALLED TO, READ BACK BY AND VERIFIED WITH: SPENCE,H ON 11/19/20 BY JUW (NOTE) SARS-CoV-2 target nucleic acids are DETECTED.  The SARS-CoV-2 RNA is generally detectable in upper respiratory specimens during the acute phase of infection. Positive results are indicative of the presence of the identified virus, but do not rule out bacterial infection or co-infection with other pathogens not detected by the test. Clinical correlation with patient history and other diagnostic information is necessary to determine patient infection status. The expected result is Negative.  Fact Sheet for Patients: EntrepreneurPulse.com.au  Fact Sheet for Healthcare Providers: IncredibleEmployment.be  This test is not yet approved or cleared by the [place] FDA and  has been authorized for detection and/or diagnosis of SARS-CoV-2 by FDA under an Emergency Use Authorization (EUA).  This EUA will remain in effect (meaning this test can be used) f or the duration of  the COVID-19 declaration under Section 564(b)(1) of the Act, 21 U.S.C. section 360bbb-3(b)(1), unless the authorization is terminated or revoked sooner.     Influenza A by PCR NEGATIVE NEGATIVE Final   Influenza B by PCR NEGATIVE  NEGATIVE Final    Comment: (NOTE) The Xpert Xpress SARS-CoV-2/FLU/RSV plus assay is intended as an aid in the diagnosis of influenza from Nasopharyngeal swab specimens and should not be used as a sole basis for treatment. Nasal washings and aspirates are unacceptable for Xpert Xpress SARS-CoV-2/FLU/RSV testing.  Fact Sheet for Patients: EntrepreneurPulse.com.au  Fact Sheet for Healthcare Providers: IncredibleEmployment.be  This test is not yet approved or cleared by the [place] FDA and has been authorized for detection and/or diagnosis of SARS-CoV-2 by FDA under an Emergency Use Authorization (EUA). This EUA will remain in effect (meaning this test can be used) for the duration of the COVID-19 declaration under Section 564(b)(1) of the Act, 21 U.S.C. section 360bbb-3(b)(1), unless the authorization is terminated or revoked.  Performed at [facility], [address] [place], [place] [address]   MRSA Next Gen by PCR, Nasal     Status: None   Collection Time: 11/21/20  6:50 PM   Specimen: Nasal Mucosa; Nasal Swab  Result Value Ref Range Status   MRSA by PCR Next Gen NOT DETECTED NOT DETECTED Final    Comment: (NOTE) The GeneXpert MRSA Assay (FDA approved for NASAL specimens only), is one component of a comprehensive MRSA colonization surveillance program. It is not intended to diagnose MRSA infection nor to guide or monitor treatment for MRSA infections. Test performance is not FDA approved in patients less than 6 years old. Performed at [facility], [address], [place], [place] [address]          Radiology Studies: [facility] 1 View  Result Date: 11/24/2020 CLINICAL DATA:  COVID-19 positive.  Respiratory failure EXAM: PORTABLE CHEST 1 VIEW COMPARISON:  11/21/2020 FINDINGS: Stable heart size. Atherosclerotic calcification of the aortic knob. Patchy bilateral interstitial opacities without interval progression from the  previous exam. No pleural effusion or pneumothorax. IMPRESSION: Patchy bilateral interstitial opacities without interval progression from the previous exam. Electronically Signed   By: Davina Poke D.O.   On: 11/24/2020 14:06        Scheduled Meds:  ALPRAZolam  0.25 mg Oral QHS   vitamin C  500 mg Oral Daily   baricitinib  4 mg Oral Daily   Chlorhexidine Gluconate Cloth  6 each Topical Daily   enoxaparin (LOVENOX)  injection  40 mg Subcutaneous Q24H   guaiFENesin  600 mg Oral BID   hydrocortisone  10 mg Oral q AM   hydrocortisone  5 mg Oral QPM   methylPREDNISolone (SOLU-MEDROL) injection  40 mg Intravenous Q12H   pantoprazole  40 mg Oral BID AC   polyvinyl alcohol  1 drop Both Eyes QID   thyroid  30 mg Oral QPM   thyroid  45 mg Oral Q0600   zinc sulfate  220 mg Oral Daily   Continuous Infusions:  remdesivir 100 mg in NS 100 mL 100 mg (11/25/20 1002)     LOS: 6 days    Time spent: 35 minutes    Joory Gough Darleen Crocker, DO [facility]  If 7PM-7AM, please contact night-coverage [email or web address] 11/25/2020, 10:44 AM

## 2020-11-25 NOTE — Plan of Care (Signed)

## 2020-11-25 NOTE — Progress Notes (Signed)
Pt is A/OX4, denies pain, verbalizes needs. Pt does have sob when ambulating to [facility] but has rapid O2 sat recovery.  Will continue to monitor.

## 2020-11-26 LAB — C-REACTIVE PROTEIN: CRP: 0.9 mg/dL (ref <1.0)

## 2020-11-26 LAB — CREATININE, SERUM
Creatinine, Ser: 0.65 mg/dL (ref 0.44–1.00)
GFR, Estimated: >60 mL/min (ref >60)

## 2020-11-26 LAB — SEDIMENTATION RATE: Sed Rate: 30 mm/hr — ABNORMAL HIGH (ref 0–22)

## 2020-11-26 NOTE — Progress Notes (Signed)
PROGRESS NOTE    Monica Harrison  DVV:[id number] DOB: 11-18-1950 DOA: 11/19/2020 PCP: Merryl Hacker, No   Brief Narrative:   Monica Harrison is a 70 y.o. female with medical history significant for adrenal insufficiency on chronic steroids.  Patient presented to the ED with complaints of generalized weakness, poor oral intake with nausea, cough and some difficulty breathing. Patient tested positive for COVID 8/4.  She reports her O2 sats were down to the 80s at home.  She subsequently called her outpatient provider and was sent a prescription 8/5 for Molnupiravir, but she says the pharmacy did not have this medication available. She presented to ED with hypoxia. CXR shows patchy bilateral  pulm infiltrates compatible with COVID pneumonia. Her oxygen requirements continue to increase. Pulmonology consultation with recommendations to continue treatment as prescribed and consider increasing steroids as needed.  Assessment & Plan:   Principal Problem:   Pneumonia due to COVID-19 virus Active Problems:   Adrenal insufficiency (HCC)   Acute hypoxemic respiratory failure (HCC)   Acute Respiratory failure with hypoxia due to COVID 19 Pneumonia-stabilizing -Pt is unvaccinated -Currently requiring 10 L nasal cannula oxygen and therefore transition to stepdown unit on 8/9 -Started on remdesivir 8/10 -Continue IV Solu-Medrol -Started on baricitinib 8/10 -Monitor inflammatory markers which are downtrending -Chest x-ray 8/12 with stable patchy bilateral opacities -Wean oxygen as tolerated, now to 10L -Monitor ESR and CRP daily    Adrenal insufficiency: Resume home hydrocortisone   Hypothyroidism Resume home thyroid medication   Constipation -MiraLAX prescribed as needed     DVT prophylaxis: Lovenox Code Status: Full Family Communication: Discussed with husband on phone 8/13 Disposition Plan:  Status is: Inpatient   Remains inpatient appropriate because:IV treatments appropriate due to intensity  of illness or inability to take PO and Inpatient level of care appropriate due to severity of illness   Dispo: The patient is from: Home              Anticipated d/c is to: Home              Patient currently is not medically stable to d/c.              Difficult to place patient No     Consultants:  None   Procedures:  See below   Antimicrobials:  Anti-infectives (From admission, onward)    Start     Dose/Rate Route Frequency Ordered Stop   11/23/20 1000  remdesivir 100 mg in sodium chloride 0.9 % 100 mL IVPB       See Hyperspace for full Linked Orders Report.   100 mg 200 mL/hr over 30 Minutes Intravenous Daily 11/22/20 0934 11/27/20 0959   11/22/20 1030  remdesivir 100 mg in sodium chloride 0.9 % 100 mL IVPB       See Hyperspace for full Linked Orders Report.   100 mg 200 mL/hr over 30 Minutes Intravenous Every 30 min 11/22/20 0934 11/22/20 1120      Subjective: Patient seen and evaluated today with no new acute complaints or concerns. No acute concerns or events noted overnight. She remains on 8L [place].  Objective: Vitals:   11/26/20 0500 11/26/20 0600 11/26/20 0700 11/26/20 0737  BP: 121/61 (!) 141/67 114/60   Pulse: 67 67 (!) 47   Resp: _0 Temp:    (!) 97.5 F (36.4 C)  TempSrc:    Oral  SpO2: 93% 90% 98% 99%  Weight:      Height:  Intake/Output Summary (Last 24 hours) at 11/26/2020 0936 Last data filed at 11/25/2020 2000 Gross per 24 hour  Intake 780 ml  Output 800 ml  Net -20 ml   Filed Weights   11/19/20 1508 11/21/20 1802  Weight: 80.7 kg 76 kg    Examination:  General exam: Appears calm and comfortable  Respiratory system: Clear to auscultation. Respiratory effort normal. 8L [place]. Cardiovascular system: S1 & S2 heard, RRR.  Gastrointestinal system: Abdomen is soft Central nervous system: Alert and awake Extremities: No edema Skin: No significant lesions noted Psychiatry: Flat affect.    Data Reviewed: I have personally reviewed  following labs and imaging studies  CBC: Recent Labs  Lab 11/20/20 0638 11/21/20 0539 11/22/20 0313 11/23/20 0332 11/24/20 0414 11/25/20 0417  WBC 2.6* 8.2 7.6 6.5 8.6 8.8  NEUTROABS 1.5* 5.7 5.9 4.5 6.3  --   HGB 13.5 13.6 12.6 13.0 13.5 12.8  HCT 40.9 40.4 37.2 39.3 40.3 36.3  MCV 91.5 90.2 90.5 91.8 89.6 89.2  PLT 247 287 303 302 327 409   Basic Metabolic Panel: Recent Labs  Lab 11/20/20 0638 11/21/20 0539 11/22/20 0313 11/23/20 0332 11/24/20 0414 11/25/20 0417 11/26/20 0347  NA 137 136 136 136 137 136  --   K 4.7 4.6 4.3 4.4 4.3 3.9  --   CL 105 105 106 106 104 108  --   CO2 _0 --   GLUCOSE 135* 151* 148* 131* 140* 154*  --   BUN _1 --   CREATININE 0.78 0.82 0.64 0.62 0.62 0.60 0.65  CALCIUM 8.9 8.8* 8.6* 8.8* 8.9 8.7*  --   MG 2.3 2.4 2.4 2.5* 2.4 2.3  --   PHOS 4.5 2.9 3.0 2.9 3.0  --   --    GFR: Estimated Creatinine Clearance: 62.9 mL/min (by C-G formula based on SCr of 0.65 mg/dL). Liver Function Tests: Recent Labs  Lab 11/21/20 0539 11/22/20 0313 11/23/20 0332 11/24/20 0414 11/25/20 0417  AST 54* 39 56* 48* 37  ALT 36 29 48* 50* 47*  ALKPHOS 40 36* 36* 36* 33*  BILITOT 0.8 0.7 0.6 0.7 0.6  PROT 7.0 6.5 6.5 6.4* 6.2*  ALBUMIN 3.3* 3.0* 3.1* 3.0* 2.9*   No results for input(s): LIPASE, AMYLASE in the last 168 hours. No results for input(s): AMMONIA in the last 168 hours. Coagulation Profile: No results for input(s): INR, PROTIME in the last 168 hours. Cardiac Enzymes: No results for input(s): CKTOTAL, CKMB, CKMBINDEX, TROPONINI in the last 168 hours. BNP (last 3 results) No results for input(s): PROBNP in the last 8760 hours. HbA1C: No results for input(s): HGBA1C in the last 72 hours. CBG: No results for input(s): GLUCAP in the last 168 hours. Lipid Profile: No results for input(s): CHOL, HDL, LDLCALC, TRIG, CHOLHDL, LDLDIRECT in the last 72 hours. Thyroid Function Tests: No results for input(s): TSH,  T4TOTAL, FREET4, T3FREE, THYROIDAB in the last 72 hours. Anemia Panel: Recent Labs    11/24/20 0414  FERRITIN 573*   Sepsis Labs: Recent Labs  Lab 11/19/20 1536  PROCALCITON <0.10    Recent Results (from the past 240 hour(s))  Resp Panel by RT-PCR (Flu A&B, Covid) Nasopharyngeal Swab     Status: Abnormal   Collection Time: 11/19/20  5:00 PM   Specimen: Nasopharyngeal Swab; Nasopharyngeal(NP) swabs in vial transport medium  Result Value Ref Range Status   SARS Coronavirus 2 by RT PCR POSITIVE (A) NEGATIVE Final  Comment: RESULT CALLED TO, READ BACK BY AND VERIFIED WITH: SPENCE,H ON 11/19/20 BY JUW (NOTE) SARS-CoV-2 target nucleic acids are DETECTED.  The SARS-CoV-2 RNA is generally detectable in upper respiratory specimens during the acute phase of infection. Positive results are indicative of the presence of the identified virus, but do not rule out bacterial infection or co-infection with other pathogens not detected by the test. Clinical correlation with patient history and other diagnostic information is necessary to determine patient infection status. The expected result is Negative.  Fact Sheet for Patients: EntrepreneurPulse.com.au  Fact Sheet for Healthcare Providers: IncredibleEmployment.be  This test is not yet approved or cleared by the [place] FDA and  has been authorized for detection and/or diagnosis of SARS-CoV-2 by FDA under an Emergency Use Authorization (EUA).  This EUA will remain in effect (meaning this test can be used) f or the duration of  the COVID-19 declaration under Section 564(b)(1) of the Act, 21 U.S.C. section 360bbb-3(b)(1), unless the authorization is terminated or revoked sooner.     Influenza A by PCR NEGATIVE NEGATIVE Final   Influenza B by PCR NEGATIVE NEGATIVE Final    Comment: (NOTE) The Xpert Xpress SARS-CoV-2/FLU/RSV plus assay is intended as an aid in the diagnosis of influenza from  Nasopharyngeal swab specimens and should not be used as a sole basis for treatment. Nasal washings and aspirates are unacceptable for Xpert Xpress SARS-CoV-2/FLU/RSV testing.  Fact Sheet for Patients: EntrepreneurPulse.com.au  Fact Sheet for Healthcare Providers: IncredibleEmployment.be  This test is not yet approved or cleared by the [place] FDA and has been authorized for detection and/or diagnosis of SARS-CoV-2 by FDA under an Emergency Use Authorization (EUA). This EUA will remain in effect (meaning this test can be used) for the duration of the COVID-19 declaration under Section 564(b)(1) of the Act, 21 U.S.C. section 360bbb-3(b)(1), unless the authorization is terminated or revoked.  Performed at [facility], [address], [place], [place] [address]   MRSA Next Gen by PCR, Nasal     Status: None   Collection Time: 11/21/20  6:50 PM   Specimen: Nasal Mucosa; Nasal Swab  Result Value Ref Range Status   MRSA by PCR Next Gen NOT DETECTED NOT DETECTED Final    Comment: (NOTE) The GeneXpert MRSA Assay (FDA approved for NASAL specimens only), is one component of a comprehensive MRSA colonization surveillance program. It is not intended to diagnose MRSA infection nor to guide or monitor treatment for MRSA infections. Test performance is not FDA approved in patients less than 74 years old. Performed at [facility], [address] [place], [place] [address]          Radiology Studies: [facility] 1 View  Result Date: 11/24/2020 CLINICAL DATA:  COVID-19 positive.  Respiratory failure EXAM: PORTABLE CHEST 1 VIEW COMPARISON:  11/21/2020 FINDINGS: Stable heart size. Atherosclerotic calcification of the aortic knob. Patchy bilateral interstitial opacities without interval progression from the previous exam. No pleural effusion or pneumothorax. IMPRESSION: Patchy bilateral interstitial opacities without interval progression from the  previous exam. Electronically Signed   By: Davina Poke D.O.   On: 11/24/2020 14:06        Scheduled Meds:  ALPRAZolam  0.25 mg Oral QHS   vitamin C  500 mg Oral Daily   baricitinib  4 mg Oral Daily   Chlorhexidine Gluconate Cloth  6 each Topical Daily   enoxaparin (LOVENOX) injection  40 mg Subcutaneous Q24H   guaiFENesin  600 mg Oral BID  hydrocortisone  10 mg Oral q AM   hydrocortisone  5 mg Oral QPM   methylPREDNISolone (SOLU-MEDROL) injection  40 mg Intravenous Q12H   pantoprazole  40 mg Oral BID AC   polyvinyl alcohol  1 drop Both Eyes QID   thyroid  30 mg Oral QPM   thyroid  45 mg Oral Q0600   zinc sulfate  220 mg Oral Daily   Continuous Infusions:  remdesivir 100 mg in NS 100 mL 100 mg (11/25/20 1002)     LOS: 7 days    Time spent: 35 minutes    Shanara Schnieders Darleen Crocker, DO [facility]  If 7PM-7AM, please contact night-coverage [email or web address] 11/26/2020, 9:36 AM

## 2020-11-26 NOTE — Progress Notes (Signed)
Pt maintaining o2 of 92 on 4 liters high flow

## 2020-11-27 LAB — C-REACTIVE PROTEIN: CRP: 0.7 mg/dL (ref <1.0)

## 2020-11-27 LAB — SEDIMENTATION RATE: Sed Rate: 21 mm/hr (ref 0–22)

## 2020-11-27 LAB — GLUCOSE, CAPILLARY
Glucose-Capillary: 117 mg/dL — ABNORMAL HIGH (ref 70–99)
Glucose-Capillary: 152 mg/dL — ABNORMAL HIGH (ref 70–99)

## 2020-11-27 NOTE — Progress Notes (Signed)
PROGRESS NOTE    Monica Harrison  OTL:[id number] DOB: 12-30-1950 DOA: 11/19/2020 PCP: Merryl Hacker, No   Brief Narrative:   Monica Harrison is a 70 y.o. female with medical history significant for adrenal insufficiency on chronic steroids.  Patient presented to the ED with complaints of generalized weakness, poor oral intake with nausea, cough and some difficulty breathing. Patient tested positive for COVID 8/4.  She reports her O2 sats were down to the 80s at home.  She subsequently called her outpatient provider and was sent a prescription 8/5 for Molnupiravir, but she says the pharmacy did not have this medication available. She presented to ED with hypoxia. CXR shows patchy bilateral  pulm infiltrates compatible with COVID pneumonia. Her oxygen requirements are now starting to diminish.  Assessment & Plan:   Principal Problem:   Pneumonia due to COVID-19 virus Active Problems:   Adrenal insufficiency (HCC)   Acute hypoxemic respiratory failure (HCC)   Acute Respiratory failure with hypoxia due to COVID 19 Pneumonia-starting to improve -Pt is unvaccinated -Currently requiring 10 L nasal cannula oxygen and therefore transition to stepdown unit on 8/9 -Started on remdesivir 8/10 -Continue IV Solu-Medrol -Started on baricitinib 8/10 -Monitor inflammatory markers which are downtrending -Chest x-ray 8/12 with stable patchy bilateral opacities -Wean oxygen as tolerated, now to 10L -Monitor ESR and CRP daily  -Continue to wean oxygen and consider oxygen on discharge by a.m.   Adrenal insufficiency: Resume home hydrocortisone   Hypothyroidism Resume home thyroid medication   Constipation -MiraLAX prescribed as needed     DVT prophylaxis: Lovenox Code Status: Full Family Communication: Discussed with husband on phone 8/13 Disposition Plan:  Status is: Inpatient   Remains inpatient appropriate because:IV treatments appropriate due to intensity of illness or inability to take PO and  Inpatient level of care appropriate due to severity of illness   Dispo: The patient is from: Home              Anticipated d/c is to: Home              Patient currently is not medically stable to d/c.              Difficult to place patient No     Consultants:  None   Procedures:  See below  Antimicrobials:  Anti-infectives (From admission, onward)    Start     Dose/Rate Route Frequency Ordered Stop   11/23/20 1000  remdesivir 100 mg in sodium chloride 0.9 % 100 mL IVPB       See Hyperspace for full Linked Orders Report.   100 mg 200 mL/hr over 30 Minutes Intravenous Daily 11/22/20 0934 11/26/20 1041   11/22/20 1030  remdesivir 100 mg in sodium chloride 0.9 % 100 mL IVPB       See Hyperspace for full Linked Orders Report.   100 mg 200 mL/hr over 30 Minutes Intravenous Every 30 min 11/22/20 0934 11/22/20 1120       Subjective: Patient seen and evaluated today with no new acute complaints or concerns. No acute concerns or events noted overnight.  Her oxygen requirements have gone down significantly since yesterday.  Objective: Vitals:   11/26/20 2142 11/27/20 0135 11/27/20 0534 11/27/20 0551  BP:  102/60 (!) 94/53   Pulse:  62 66   Resp:  18    Temp:  98.1 F (36.7 C) 98.3 F (36.8 C)   TempSrc:  Oral Oral   SpO2: 95%  (!) 89%   Weight:  74.5 kg  Height:        Intake/Output Summary (Last 24 hours) at 11/27/2020 0940 Last data filed at 11/26/2020 1750 Gross per 24 hour  Intake 1200 ml  Output --  Net 1200 ml   Filed Weights   11/19/20 1508 11/21/20 1802 11/27/20 0551  Weight: 80.7 kg 76 kg 74.5 kg    Examination:  General exam: Appears calm and comfortable  Respiratory system: Clear to auscultation. Respiratory effort normal.  Currently on 4 L nasal cannula. Cardiovascular system: S1 & S2 heard, RRR.  Gastrointestinal system: Abdomen is soft Central nervous system: Alert and awake Extremities: No edema Skin: No significant lesions noted Psychiatry:  Flat affect.    Data Reviewed: I have personally reviewed following labs and imaging studies  CBC: Recent Labs  Lab 11/21/20 0539 11/22/20 0313 11/23/20 0332 11/24/20 0414 11/25/20 0417  WBC 8.2 7.6 6.5 8.6 8.8  NEUTROABS 5.7 5.9 4.5 6.3  --   HGB 13.6 12.6 13.0 13.5 12.8  HCT 40.4 37.2 39.3 40.3 36.3  MCV 90.2 90.5 91.8 89.6 89.2  PLT 287 303 302 327 681   Basic Metabolic Panel: Recent Labs  Lab 11/21/20 0539 11/22/20 0313 11/23/20 0332 11/24/20 0414 11/25/20 0417 11/26/20 0347  NA 136 136 136 137 136  --   K 4.6 4.3 4.4 4.3 3.9  --   CL 105 106 106 104 108  --   CO2 '24 23 25 26 25  ' --   GLUCOSE 151* 148* 131* 140* 154*  --   BUN '23 22 21 18 18  ' --   CREATININE 0.82 0.64 0.62 0.62 0.60 0.65  CALCIUM 8.8* 8.6* 8.8* 8.9 8.7*  --   MG 2.4 2.4 2.5* 2.4 2.3  --   PHOS 2.9 3.0 2.9 3.0  --   --    GFR: Estimated Creatinine Clearance: 62.3 mL/min (by C-G formula based on SCr of 0.65 mg/dL). Liver Function Tests: Recent Labs  Lab 11/21/20 0539 11/22/20 0313 11/23/20 0332 11/24/20 0414 11/25/20 0417  AST 54* 39 56* 48* 37  ALT 36 29 48* 50* 47*  ALKPHOS 40 36* 36* 36* 33*  BILITOT 0.8 0.7 0.6 0.7 0.6  PROT 7.0 6.5 6.5 6.4* 6.2*  ALBUMIN 3.3* 3.0* 3.1* 3.0* 2.9*   No results for input(s): LIPASE, AMYLASE in the last 168 hours. No results for input(s): AMMONIA in the last 168 hours. Coagulation Profile: No results for input(s): INR, PROTIME in the last 168 hours. Cardiac Enzymes: No results for input(s): CKTOTAL, CKMB, CKMBINDEX, TROPONINI in the last 168 hours. BNP (last 3 results) No results for input(s): PROBNP in the last 8760 hours. HbA1C: No results for input(s): HGBA1C in the last 72 hours. CBG: Recent Labs  Lab 11/27/20 0751  GLUCAP 117*   Lipid Profile: No results for input(s): CHOL, HDL, LDLCALC, TRIG, CHOLHDL, LDLDIRECT in the last 72 hours. Thyroid Function Tests: No results for input(s): TSH, T4TOTAL, FREET4, T3FREE, THYROIDAB in the last  72 hours. Anemia Panel: No results for input(s): VITAMINB12, FOLATE, FERRITIN, TIBC, IRON, RETICCTPCT in the last 72 hours. Sepsis Labs: No results for input(s): PROCALCITON, LATICACIDVEN in the last 168 hours.  Recent Results (from the past 240 hour(s))  Resp Panel by RT-PCR (Flu A&B, Covid) Nasopharyngeal Swab     Status: Abnormal   Collection Time: 11/19/20  5:00 PM   Specimen: Nasopharyngeal Swab; Nasopharyngeal(NP) swabs in vial transport medium  Result Value Ref Range Status   SARS Coronavirus 2 by RT PCR POSITIVE (  A) NEGATIVE Final    Comment: RESULT CALLED TO, READ BACK BY AND VERIFIED WITH: SPENCE,H ON 11/19/20 BY JUW (NOTE) SARS-CoV-2 target nucleic acids are DETECTED.  The SARS-CoV-2 RNA is generally detectable in upper respiratory specimens during the acute phase of infection. Positive results are indicative of the presence of the identified virus, but do not rule out bacterial infection or co-infection with other pathogens not detected by the test. Clinical correlation with patient history and other diagnostic information is necessary to determine patient infection status. The expected result is Negative.  Fact Sheet for Patients: EntrepreneurPulse.com.au  Fact Sheet for Healthcare Providers: IncredibleEmployment.be  This test is not yet approved or cleared by the [place] FDA and  has been authorized for detection and/or diagnosis of SARS-CoV-2 by FDA under an Emergency Use Authorization (EUA).  This EUA will remain in effect (meaning this test can be used) f or the duration of  the COVID-19 declaration under Section 564(b)(1) of the Act, 21 U.S.C. section 360bbb-3(b)(1), unless the authorization is terminated or revoked sooner.     Influenza A by PCR NEGATIVE NEGATIVE Final   Influenza B by PCR NEGATIVE NEGATIVE Final    Comment: (NOTE) The Xpert Xpress SARS-CoV-2/FLU/RSV plus assay is intended as an aid in the diagnosis  of influenza from Nasopharyngeal swab specimens and should not be used as a sole basis for treatment. Nasal washings and aspirates are unacceptable for Xpert Xpress SARS-CoV-2/FLU/RSV testing.  Fact Sheet for Patients: EntrepreneurPulse.com.au  Fact Sheet for Healthcare Providers: IncredibleEmployment.be  This test is not yet approved or cleared by the [place] FDA and has been authorized for detection and/or diagnosis of SARS-CoV-2 by FDA under an Emergency Use Authorization (EUA). This EUA will remain in effect (meaning this test can be used) for the duration of the COVID-19 declaration under Section 564(b)(1) of the Act, 21 U.S.C. section 360bbb-3(b)(1), unless the authorization is terminated or revoked.  Performed at [facility], [address] [place], [place] [address]   MRSA Next Gen by PCR, Nasal     Status: None   Collection Time: 11/21/20  6:50 PM   Specimen: Nasal Mucosa; Nasal Swab  Result Value Ref Range Status   MRSA by PCR Next Gen NOT DETECTED NOT DETECTED Final    Comment: (NOTE) The GeneXpert MRSA Assay (FDA approved for NASAL specimens only), is one component of a comprehensive MRSA colonization surveillance program. It is not intended to diagnose MRSA infection nor to guide or monitor treatment for MRSA infections. Test performance is not FDA approved in patients less than 16 years old. Performed at [facility], [address], [place], [place] [address]          Radiology Studies: No results found.      Scheduled Meds:  ALPRAZolam  0.25 mg Oral QHS   vitamin C  500 mg Oral Daily   baricitinib  4 mg Oral Daily   Chlorhexidine Gluconate Cloth  6 each Topical Daily   enoxaparin (LOVENOX) injection  40 mg Subcutaneous Q24H   guaiFENesin  600 mg Oral BID   hydrocortisone  10 mg Oral q AM   hydrocortisone  5 mg Oral QPM   methylPREDNISolone (SOLU-MEDROL) injection  40 mg Intravenous Q12H   pantoprazole   40 mg Oral BID AC   polyvinyl alcohol  1 drop Both Eyes QID   thyroid  30 mg Oral QPM   thyroid  45 mg Oral Q0600   zinc sulfate  220 mg Oral Daily  LOS: 8 days    Time spent: 35 minutes    Zavior Thomason Darleen Crocker, DO [facility]  If 7PM-7AM, please contact night-coverage [email or web address] 11/27/2020, 9:40 AM

## 2020-11-28 MED ORDER — ALBUTEROL SULFATE (2.5 MG/3ML) 0.083% IN NEBU: 2.5000 mg | INHALATION_SOLUTION | Freq: Four times a day (QID) | RESPIRATORY_TRACT | 12 refills | Status: DC | PRN

## 2020-11-28 MED ORDER — PREDNISONE 10 MG PO TABS: 40.0000 mg | ORAL_TABLET | Freq: Every day | ORAL | 0 refills | Status: AC

## 2020-11-28 MED ORDER — POLYVINYL ALCOHOL 1.4 % OP SOLN: 1.0000 [drp] | Freq: Four times a day (QID) | OPHTHALMIC | 0 refills | Status: DC

## 2020-11-28 MED ORDER — ZINC SULFATE 220 (50 ZN) MG PO CAPS: 220.0000 mg | ORAL_CAPSULE | Freq: Every day | ORAL | 0 refills | Status: AC

## 2020-11-28 MED ORDER — GUAIFENESIN-DM 100-10 MG/5ML PO SYRP
10.0000 mL | ORAL_SOLUTION | ORAL | 0 refills | Status: DC | PRN
Start: 2020-11-28 — End: 2021-08-31

## 2020-11-28 MED ORDER — BISACODYL 10 MG RE SUPP: 10.0000 mg | Freq: Once | RECTAL | Status: DC

## 2020-11-28 NOTE — Care Management Important Message (Signed)
Important Message  Patient Details  Name: Monica Harrison MRN: [id number] Date of Birth: 1951/01/25   Medicare Important Message Given:  Yes - Important Message mailed due to current National Emergency     Corey Harold 11/28/2020, 2:18 PM

## 2020-11-28 NOTE — Progress Notes (Addendum)
SATURATION QUALIFICATIONS: (This note is used to comply with regulatory documentation for home oxygen)  Patient Saturations on Room Air at Rest = 86%  Patient Saturations on Room Air while Ambulating = 81%  Patient Saturations on 3 Liters at Rest = 93%  Patient Saturations on 3 Liters of oxygen while Ambulating = 91%

## 2020-11-28 NOTE — Discharge Summary (Signed)
Physician Discharge Summary  Monica Harrison WCB:[id number] DOB: Aug 06, 1950 DOA: 11/19/2020  PCP: Pcp, No  Admit date: 11/19/2020  Discharge date: 11/28/2020  Admitted From:Home  Disposition:  Home  Recommendations for Outpatient Follow-up:  Follow up with PCP in 1-2 weeks Continue on prednisone 40 mg for 5 more days as prescribed Refills given for DuoNebs as needed for shortness of breath or wheezing Continue other home medications as prior  Home Health: None  Equipment/Devices: Home 3 L nasal cannula oxygen  Discharge Condition:Stable  CODE STATUS: Full  Diet recommendation: Heart Healthy  Brief/Interim Summary:  Monica Harrison is a 70 y.o. female with medical history significant for adrenal insufficiency on chronic steroids.  Patient presented to the ED with complaints of generalized weakness, poor oral intake with nausea, cough and some difficulty breathing. Patient tested positive for COVID 8/4.  She reports her O2 sats were down to the 80s at home.  She subsequently called her outpatient provider and was sent a prescription 8/5 for Molnupiravir, but she says the pharmacy did not have this medication available. She presented to ED with hypoxia. CXR shows patchy bilateral  pulm infiltrates compatible with COVID pneumonia.  -Patient required lengthy treatment with IV Solu-Medrol as well as remdesivir and baricitinib during the course of her stay.  She had not been previously vaccinated.  She required up to 15 L high flow nasal cannula oxygen and now has been weaned down to 3 L nasal cannula.  She states that she is overall feeling much better and is ready for discharge on home 3 L nasal cannula oxygen.  She will require oral steroids as prescribed for several more days with close follow-up to her PCP.  Pulmonology had seen her with no further recommendations noted at this time.  Discharge Diagnoses:  Principal Problem:   Pneumonia due to COVID-19 virus Active Problems:   Adrenal  insufficiency (HCC)   Acute hypoxemic respiratory failure (HCC)  Principal discharge diagnosis: Acute hypoxemic respiratory failure secondary to COVID-19 pneumonia.  Discharge Instructions  Discharge Instructions     Diet - low sodium heart healthy   Complete by: As directed    Increase activity slowly   Complete by: As directed       Allergies as of 11/28/2020       Reactions   Avelox [moxifloxacin Hcl In Nacl] Anaphylaxis   Biaxin [clarithromycin] Anaphylaxis   Lactose Intolerance (gi)    Bloating, gas, congestion   Sulfa Antibiotics Other (See Comments)   Fatigue   Tobramycin    Severe headaches    Yeast-related Products Swelling   Tongue swelling /candida        Medication List     STOP taking these medications    amoxicillin-clavulanate 875-125 MG tablet Commonly known as: Augmentin   estradiol 1 MG tablet Commonly known as: ESTRACE   fluconazole 100 MG tablet Commonly known as: Diflucan   molnupiravir EUA 200 mg Caps   PRESCRIPTION MEDICATION   PRESCRIPTION MEDICATION       TAKE these medications    albuterol 108 (90 Base) MCG/ACT inhaler Commonly known as: VENTOLIN HFA Inhale 2 puffs into the lungs every 6 (six) hours as needed for wheezing or shortness of breath.   albuterol (2.5 MG/3ML) 0.083% nebulizer solution Commonly known as: PROVENTIL Take 3 mLs (2.5 mg total) by nebulization every 6 (six) hours as needed for wheezing or shortness of breath.   ALPRAZolam 0.25 MG tablet Commonly known as: XANAX Take 0.25 mg by mouth at bedtime.  B COMPLEX 50 PO Take 1 tablet by mouth daily.   benzonatate 200 MG capsule Commonly known as: TESSALON Take 1 capsule (200 mg total) by mouth 3 (three) times daily as needed for cough.   budesonide 32 MCG/ACT nasal spray Commonly known as: RHINOCORT AQUA Place 1 spray into both nostrils daily as needed for rhinitis.   CAL-MAG-ZINC PO Take 2 tablets by mouth at bedtime.   Co Q 10 100 MG  Caps Take 100 mg by mouth every evening.   Enzyme Digest Caps Take 1 capsule by mouth 2 (two) times daily with a meal.   guaiFENesin-dextromethorphan 100-10 MG/5ML syrup Commonly known as: ROBITUSSIN DM Take 10 mLs by mouth every 4 (four) hours as needed for cough.   hydrocortisone 5 MG tablet Commonly known as: CORTEF Take 5 mg by mouth See admin instructions. TAKE 10MG  IN THE MORNING AND 5 MG IN THE AFTERNOON.   ibuprofen 200 MG tablet Commonly known as: ADVIL Take 400 mg by mouth every 6 (six) hours as needed for moderate pain.   IMMUNE FORMULA PO Take 1 tablet by mouth 4 (four) times a week.   Magnesium 400 MG Tabs Take 400 mg by mouth at bedtime.   multivitamin with minerals Tabs tablet Take 3 tablets by mouth daily.   OMEGA 3 PO Take 2 capsules by mouth 2 (two) times daily.   Polyethyl Glycol-Propyl Glycol 0.4-0.3 % Soln Place 1 drop into both eyes 4 (four) times daily.   polyvinyl alcohol 1.4 % ophthalmic solution Commonly known as: LIQUIFILM TEARS Place 1 drop into both eyes 4 (four) times daily.   predniSONE 10 MG tablet Commonly known as: DELTASONE Take 4 tablets (40 mg total) by mouth daily for 5 days.   PROBIOTIC PO Take 1 capsule by mouth every evening.   thyroid 30 MG tablet Commonly known as: ARMOUR Take 45 mg every morning and 30 mg every afternoon What changed: Another medication with the same name was removed. Continue taking this medication, and follow the directions you see here.   vitamin A UNIT capsule Take 10,000 Units by mouth every evening.   vitamin C 1000 MG tablet Take 2,000 mg by mouth 2 (two) times daily.   Vitamin D3 125 MCG (5000 UT) Caps Take 15,000 Units by mouth at bedtime.   zinc sulfate 220 (50 Zn) MG capsule Take 1 capsule (220 mg total) by mouth daily. Start taking on: November 29, 2020               Durable Medical Equipment  (From admission, onward)           Start     Ordered   11/28/20 0931   For home use only DME oxygen  Once       Question Answer Comment  Length of Need 6 Months   Mode or (Route) Nasal cannula   Liters per Minute 3   Frequency Continuous (stationary and portable oxygen unit needed)   Oxygen conserving device Yes   Oxygen delivery system Gas      11/28/20 0930            Follow-up Information     pcp. Schedule an appointment as soon as possible for a visit in 1 week(s).                 Allergies  Allergen Reactions   Avelox [Moxifloxacin Hcl In Nacl] Anaphylaxis   Biaxin [Clarithromycin] Anaphylaxis   Lactose Intolerance (Gi)  Bloating, gas, congestion   Sulfa Antibiotics Other (See Comments)    Fatigue    Tobramycin     Severe headaches    Yeast-Related Products Swelling    Tongue swelling /candida    Consultations: Pulmonology   Procedures/Studies: DG Chest 2 View  Result Date: 11/19/2020 CLINICAL DATA:  Dyspnea, COVID positive EXAM: CHEST - 2 VIEW COMPARISON:  05/16/2020 FINDINGS: Patchy peripheral asymmetric pulmonary infiltrates have developed within the peripheral right mid lung zone and lung bases bilaterally, likely infectious or inflammatory in the acute setting. These are compatible with changes seen in the setting of acute COVID pneumonia. No pneumothorax or pleural effusion. Cardiac size within normal limits. Pulmonary vascularity is normal. No acute bone abnormality peer IMPRESSION: Interval development of patchy bilateral pulmonary infiltrates, likely infectious or inflammatory and compatible with those seen in acute COVID pneumonia. Electronically Signed   By: Helyn Numbers MD   On: 11/19/2020 16:08   DG Chest Port 1 View  Result Date: 11/24/2020 CLINICAL DATA:  COVID-19 positive.  Respiratory failure EXAM: PORTABLE CHEST 1 VIEW COMPARISON:  11/21/2020 FINDINGS: Stable heart size. Atherosclerotic calcification of the aortic knob. Patchy bilateral interstitial opacities without interval progression from the previous  exam. No pleural effusion or pneumothorax. IMPRESSION: Patchy bilateral interstitial opacities without interval progression from the previous exam. Electronically Signed   By: Duanne Guess D.O.   On: 11/24/2020 14:06   DG CHEST PORT 1 VIEW  Result Date: 11/21/2020 CLINICAL DATA:  Hypoxemia, COVID-19 positive. EXAM: PORTABLE CHEST 1 VIEW COMPARISON:  November 19, 2020. FINDINGS: The heart size and mediastinal contours are within normal limits. Stable patchy peripheral airspace opacities are noted most consistent with COVID-19 pneumonia. The visualized skeletal structures are unremarkable. IMPRESSION: Stable bilateral lung opacities as described above. Electronically Signed   By: Lupita Raider M.D.   On: 11/21/2020 10:18   DG Chest Port 1V same Day  Result Date: 11/21/2020 CLINICAL DATA:  Hypoxemia, COVID-19 positivity EXAM: PORTABLE CHEST 1 VIEW COMPARISON:  Film from earlier in the same day. FINDINGS: Cardiac shadow is stable. Aortic calcifications are again identified. Patchy bilateral airspace opacities are noted slightly improved on the left when compared with the prior exam. No new focal abnormality is noted. IMPRESSION: Slight improved aeration when compare with the prior exam. Electronically Signed   By: Alcide Clever M.D.   On: 11/21/2020 19:12     Discharge Exam: Vitals:   11/27/20 2002 11/28/20 0436  BP: 135/68 (!) 107/58  Pulse: 98 (!) 57  Resp: 20 19  Temp: 98.1 F (36.7 C) 98.9 F (37.2 C)  SpO2: 92% 96%   Vitals:   11/27/20 0551 11/27/20 1609 11/27/20 2002 11/28/20 0436  BP:  (!) 107/52 135/68 (!) 107/58  Pulse:  90 98 (!) 57  Resp:  Temp:  98.3 F (36.8 C) 98.1 F (36.7 C) 98.9 F (37.2 C)  TempSrc:  Oral  Oral  SpO2:  90% 92% 96%  Weight: 74.5 kg     Height:        General: Pt is alert, awake, not in acute distress Cardiovascular: RRR, S1/S2 +, no rubs, no gallops Respiratory: CTA bilaterally, no wheezing, no rhonchi, on 3 L nasal cannula  oxygen Abdominal: Soft, NT, ND, bowel sounds + Extremities: no edema, no cyanosis    The results of significant diagnostics from this hospitalization (including imaging, microbiology, ancillary and laboratory) are listed below for reference.     Microbiology: Recent Results (from the  past 240 hour(s))  Resp Panel by RT-PCR (Flu A&B, Covid) Nasopharyngeal Swab     Status: Abnormal   Collection Time: 11/19/20  5:00 PM   Specimen: Nasopharyngeal Swab; Nasopharyngeal(NP) swabs in vial transport medium  Result Value Ref Range Status   SARS Coronavirus 2 by RT PCR POSITIVE (A) NEGATIVE Final    Comment: RESULT CALLED TO, READ BACK BY AND VERIFIED WITH: SPENCE,H ON 11/19/20 BY JUW (NOTE) SARS-CoV-2 target nucleic acids are DETECTED.  The SARS-CoV-2 RNA is generally detectable in upper respiratory specimens during the acute phase of infection. Positive results are indicative of the presence of the identified virus, but do not rule out bacterial infection or co-infection with other pathogens not detected by the test. Clinical correlation with patient history and other diagnostic information is necessary to determine patient infection status. The expected result is Negative.  Fact Sheet for Patients: BloggerCourse.com  Fact Sheet for Healthcare Providers: SeriousBroker.it  This test is not yet approved or cleared by the [place] FDA and  has been authorized for detection and/or diagnosis of SARS-CoV-2 by FDA under an Emergency Use Authorization (EUA).  This EUA will remain in effect (meaning this test can be used) f or the duration of  the COVID-19 declaration under Section 564(b)(1) of the Act, 21 U.S.C. section 360bbb-3(b)(1), unless the authorization is terminated or revoked sooner.     Influenza A by PCR NEGATIVE NEGATIVE Final   Influenza B by PCR NEGATIVE NEGATIVE Final    Comment: (NOTE) The Xpert Xpress  SARS-CoV-2/FLU/RSV plus assay is intended as an aid in the diagnosis of influenza from Nasopharyngeal swab specimens and should not be used as a sole basis for treatment. Nasal washings and aspirates are unacceptable for Xpert Xpress SARS-CoV-2/FLU/RSV testing.  Fact Sheet for Patients: BloggerCourse.com  Fact Sheet for Healthcare Providers: SeriousBroker.it  This test is not yet approved or cleared by the [place] FDA and has been authorized for detection and/or diagnosis of SARS-CoV-2 by FDA under an Emergency Use Authorization (EUA). This EUA will remain in effect (meaning this test can be used) for the duration of the COVID-19 declaration under Section 564(b)(1) of the Act, 21 U.S.C. section 360bbb-3(b)(1), unless the authorization is terminated or revoked.  Performed at [facility], [address], [place], [place] [address]   MRSA Next Gen by PCR, Nasal     Status: None   Collection Time: 11/21/20  6:50 PM   Specimen: Nasal Mucosa; Nasal Swab  Result Value Ref Range Status   MRSA by PCR Next Gen NOT DETECTED NOT DETECTED Final    Comment: (NOTE) The GeneXpert MRSA Assay (FDA approved for NASAL specimens only), is one component of a comprehensive MRSA colonization surveillance program. It is not intended to diagnose MRSA infection nor to guide or monitor treatment for MRSA infections. Test performance is not FDA approved in patients less than 35 years old. Performed at [facility], [address], [place], [place] [address]      Labs: BNP (last 3 results) No results for input(s): BNP in the last 8760 hours. Basic Metabolic Panel: Recent Labs  Lab 11/22/20 0313 11/23/20 0332 11/24/20 0414 11/25/20 0417 11/26/20 0347  NA 136 136 137 136  --   K 4.3 4.4 4.3 3.9  --   CL 106 106 104 108  --   CO2 --   GLUCOSE 148* 131* 140* 154*  --   BUN --   CREATININE  0.64 0.62 0.62 0.60 0.65   CALCIUM 8.6* 8.8* 8.9 8.7*  --   MG 2.4 2.5* 2.4 2.3  --   PHOS 3.0 2.9 3.0  --   --    Liver Function Tests: Recent Labs  Lab 11/22/20 0313 11/23/20 0332 11/24/20 0414 11/25/20 0417  AST 39 56* 48* 37  ALT 29 48* 50* 47*  ALKPHOS 36* 36* 36* 33*  BILITOT 0.7 0.6 0.7 0.6  PROT 6.5 6.5 6.4* 6.2*  ALBUMIN 3.0* 3.1* 3.0* 2.9*   No results for input(s): LIPASE, AMYLASE in the last 168 hours. No results for input(s): AMMONIA in the last 168 hours. CBC: Recent Labs  Lab 11/22/20 0313 11/23/20 0332 11/24/20 0414 11/25/20 0417  WBC 7.6 6.5 8.6 8.8  NEUTROABS 5.9 4.5 6.3  --   HGB 12.6 13.0 13.5 12.8  HCT 37.2 39.3 40.3 36.3  MCV 90.5 91.8 89.6 89.2  PLT 303 302 327 318   Cardiac Enzymes: No results for input(s): CKTOTAL, CKMB, CKMBINDEX, TROPONINI in the last 168 hours. BNP: Invalid input(s): POCBNP CBG: Recent Labs  Lab 11/27/20 0751 11/27/20 1132  GLUCAP 117* 152*   D-Dimer No results for input(s): DDIMER in the last 72 hours. Hgb A1c No results for input(s): HGBA1C in the last 72 hours. Lipid Profile No results for input(s): CHOL, HDL, LDLCALC, TRIG, CHOLHDL, LDLDIRECT in the last 72 hours. Thyroid function studies No results for input(s): TSH, T4TOTAL, T3FREE, THYROIDAB in the last 72 hours.  Invalid input(s): FREET3 Anemia work up No results for input(s): VITAMINB12, FOLATE, FERRITIN, TIBC, IRON, RETICCTPCT in the last 72 hours. Urinalysis    Component Value Date/Time   COLORURINE YELLOW 03/09/2016 1321   APPEARANCEUR CLEAR 03/09/2016 1321   LABSPEC 1.010 03/09/2016 1321   PHURINE 7.5 03/09/2016 1321   GLUCOSEU NEGATIVE 03/09/2016 1321   HGBUR NEGATIVE 03/09/2016 1321   BILIRUBINUR NEGATIVE 03/09/2016 1321   KETONESUR NEGATIVE 03/09/2016 1321   PROTEINUR NEGATIVE 03/09/2016 1321   NITRITE NEGATIVE 03/09/2016 1321   LEUKOCYTESUR NEGATIVE 03/09/2016 1321   Sepsis Labs Invalid input(s): PROCALCITONIN,  WBC,  LACTICIDVEN Microbiology Recent  Results (from the past 240 hour(s))  Resp Panel by RT-PCR (Flu A&B, Covid) Nasopharyngeal Swab     Status: Abnormal   Collection Time: 11/19/20  5:00 PM   Specimen: Nasopharyngeal Swab; Nasopharyngeal(NP) swabs in vial transport medium  Result Value Ref Range Status   SARS Coronavirus 2 by RT PCR POSITIVE (A) NEGATIVE Final    Comment: RESULT CALLED TO, READ BACK BY AND VERIFIED WITH: SPENCE,H ON 11/19/20 BY JUW (NOTE) SARS-CoV-2 target nucleic acids are DETECTED.  The SARS-CoV-2 RNA is generally detectable in upper respiratory specimens during the acute phase of infection. Positive results are indicative of the presence of the identified virus, but do not rule out bacterial infection or co-infection with other pathogens not detected by the test. Clinical correlation with patient history and other diagnostic information is necessary to determine patient infection status. The expected result is Negative.  Fact Sheet for Patients: BloggerCourse.com  Fact Sheet for Healthcare Providers: SeriousBroker.it  This test is not yet approved or cleared by the [place] FDA and  has been authorized for detection and/or diagnosis of SARS-CoV-2 by FDA under an Emergency Use Authorization (EUA).  This EUA will remain in effect (meaning this test can be used) f or the duration of  the COVID-19 declaration under Section 564(b)(1) of the Act, 21 U.S.C. section 360bbb-3(b)(1), unless the authorization is terminated or revoked sooner.  Influenza A by PCR NEGATIVE NEGATIVE Final   Influenza B by PCR NEGATIVE NEGATIVE Final    Comment: (NOTE) The Xpert Xpress SARS-CoV-2/FLU/RSV plus assay is intended as an aid in the diagnosis of influenza from Nasopharyngeal swab specimens and should not be used as a sole basis for treatment. Nasal washings and aspirates are unacceptable for Xpert Xpress SARS-CoV-2/FLU/RSV testing.  Fact Sheet for  Patients: BloggerCourse.comhttps://www.fda.gov/media/152166/download  Fact Sheet for Healthcare Providers: SeriousBroker.ithttps://www.fda.gov/media/152162/download  This test is not yet approved or cleared by the [place] FDA and has been authorized for detection and/or diagnosis of SARS-CoV-2 by FDA under an Emergency Use Authorization (EUA). This EUA will remain in effect (meaning this test can be used) for the duration of the COVID-19 declaration under Section 564(b)(1) of the Act, 21 U.S.C. section 360bbb-3(b)(1), unless the authorization is terminated or revoked.  Performed at [facility], [address] [place], [place] [address]   MRSA Next Gen by PCR, Nasal     Status: None   Collection Time: 11/21/20  6:50 PM   Specimen: Nasal Mucosa; Nasal Swab  Result Value Ref Range Status   MRSA by PCR Next Gen NOT DETECTED NOT DETECTED Final    Comment: (NOTE) The GeneXpert MRSA Assay (FDA approved for NASAL specimens only), is one component of a comprehensive MRSA colonization surveillance program. It is not intended to diagnose MRSA infection nor to guide or monitor treatment for MRSA infections. Test performance is not FDA approved in patients less than 71 years old. Performed at [facility], [address] [place], [place] [address]      Time coordinating discharge: 35 minutes  SIGNED:   Erick BlinksPratik D Javin Nong, DO [facility] 11/28/2020, 9:36 AM  If 7PM-7AM, please contact night-coverage [email or web address]

## 2020-11-28 NOTE — TOC Transition Note (Signed)
Transition of Care [facility]) - CM/SW Discharge Note   Patient Details  Name: Monica Harrison MRN: [id number] Date of Birth: 06-Nov-1950  Transition of Care [facility]) CM/SW Contact:  Elliot Gault, LCSW Phone Number: 11/28/2020, 9:50 AM   Clinical Narrative:     Pt stable for dc home today per MD. Pt has orders for Home O2 at dc. Spoke with pt to review CMS provider options and referred as requested.   [facility] will deliver setup to pt's home and her husband will then bring portable tank to pick up pt and take her home.  Pt did not have any other TOC needs for dc.  Final next level of care: Home/Self Care Barriers to Discharge: Barriers Resolved   Patient Goals and CMS Choice Patient states their goals for this hospitalization and ongoing recovery are:: go home CMS Medicare.gov Compare Post Acute Care list provided to:: Patient Choice offered to / list presented to : Patient  Discharge Placement                       Discharge Plan and Services                DME Arranged: Oxygen DME Agency: [facility] [place] [place]) Date DME Agency Contacted: 11/28/20                Social Determinants of Health (SDOH) Interventions     Readmission Risk Interventions No flowsheet data found.

## 2020-11-30 ENCOUNTER — Ambulatory Visit: Payer: Medicare Other | Admitting: Pulmonary Disease

## 2020-12-13 ENCOUNTER — Ambulatory Visit (INDEPENDENT_AMBULATORY_CARE_PROVIDER_SITE_OTHER): Payer: Medicare Other | Admitting: Primary Care

## 2020-12-13 ENCOUNTER — Encounter: Payer: Self-pay | Admitting: Primary Care

## 2020-12-13 ENCOUNTER — Other Ambulatory Visit: Payer: Self-pay

## 2020-12-13 VITALS — BP 106/64 | HR 74 | Temp 97.9°F | Ht 63.0 in | Wt 176.8 lb

## 2020-12-13 DIAGNOSIS — U071 COVID-19: Secondary | ICD-10-CM

## 2020-12-13 DIAGNOSIS — J1282 Pneumonia due to coronavirus disease 2019: Secondary | ICD-10-CM

## 2020-12-13 DIAGNOSIS — J9601 Acute respiratory failure with hypoxia: Secondary | ICD-10-CM | POA: Diagnosis not present

## 2020-12-13 NOTE — Assessment & Plan Note (Signed)
12/13/2020 Walked on RA x 1 lap = approx 277ft at slow paced and stopped d/t dizziness and weakness, no oxygen desaturations noted. O2 remains at 94% RA.  - Recommend she use 0-1L oxygen at rest and 1-2L on exertion. She can taper to maintained O2 >88-90%

## 2020-12-13 NOTE — Assessment & Plan Note (Addendum)
-   Hospitalized in early August 2022 for 11 days d/t COVID-19 pneumonia requiring high flow oxygen. She was unvaccinated at the time. Chest x-ray showed patchy bilateral pulmonary infiltrates. She was treated with IV Solu-Medrol as well as remdesivir and baricitinib. She was discharged on 3L oxygen and prednisone taper. She is doing better today but still has residual shortness of breath, fatigue and weakness. She had rales to bilateral lung bases on exam. She is requiring less oxygen. Advised she use IS 5-10 deep breaths every hour while awake. Recommend repeating CXR in 4 weeks at follow-up.

## 2020-12-13 NOTE — Progress Notes (Signed)
@Patient  ID: Monica Harrison, female    DOB: 09/23/1950, 70 y.o.   MRN: [id number]  Chief Complaint  Patient presents with   Follow-up    Patient reports was in the hospital and got Covid. She reports that she is feeling some better.     Referring provider: No ref. provider found  HPI: 70 year old female. PMH significant for allergies, ?COPD/asthma, suspected sleep apnea, bronchitis, adrenal insufficiency, lyme's disease, angioedema. Patient of Dr. Isaiah SergeMannam, seen on 05/03/20 for initial consult for dyspnea on exertion. Spirometry in 2017 showed no obstructive airway disease, needs repeat PFTs.   Previous [facility] pulmonary encounter: 11/17/2020  Patient tested positive for covid for yesterday, he symptoms started Sunday July 31st. Initially started out with cough and sinus congestion. Her cough is dry. She has some associated back discomfort. She is also experiencing nausea, diarrhea, dry mouth and decreased appetite. No significant shortness of breath, chest tightness or wheezing. Last episode of diarrhea was yesterday. She is taking mucinex 1200mg  twice a day. She picked up delsym cough syrup yesterday. She is currently on Augmentin. She takes hydrocortisone for adrenal insufficiency.  She is not on any cholesterol or blood pressure medications.  No fevers, chills, sweats, loss of smell/taste.    12/13/2020 Patient presents today for hospital follow-up. She was admitted at [facility] from 11/19/2020 - 11/28/2020 for Covid-19.  She originally presented to ED with complaints of generalized weakness, poor oral intake with nausea, cough and some difficulty breathing.  She tested positive for COVID on 8/4.  She was unable to get Molnupiravir that was prescribed by our office on 8/5 as her pharmacy did not carry medication.  Chest x-ray showed patchy bilateral pulmonary infiltrates compatible with COVID-pneumonia.  She was treated with IV Solu-Medrol as well as remdesivir and baricitinib.  Required up to  15 L high flow nasal cannula oxygen and was weaned to 3 L at discharge.  She was sent home on oral prednisone.  She had not been previously vaccinated.  She is doing alright. Her breathing is better but not back to baseline. She has residual weakness and fatigue. She has been taking robitussin at night for occasional cough and using albuterol nebulizer 2-3 times a day. She finished 5 day course of oral prednisone last week. She has been wearing 3L oxygen continuously since discharge. She is on hydrocortisone for adrenal insufficieny. She has some lower extremity edema that improved with elevation. Denies f/c/s, productive cough, chest tightness or wheezing.    Allergies  Allergen Reactions   Avelox [Moxifloxacin Hcl In Nacl] Anaphylaxis   Biaxin [Clarithromycin] Anaphylaxis   Lactose Intolerance (Gi) Other (See Comments)    Bloating, gas, congestion   Tobramycin Other (See Comments)    Severe headaches    Yeast-Related Products Swelling    Tongue swelling /candida     There is no immunization history on file for this patient.  Past Medical History:  Diagnosis Date   Adrenal insufficiency (HCC)    Thyroid disease     Tobacco History: Social History   Tobacco Use  Smoking Status Never  Smokeless Tobacco Never   Counseling given: Not Answered   Outpatient Medications Prior to Visit  Medication Sig Dispense Refill   albuterol (PROVENTIL) (2.5 MG/3ML) 0.083% nebulizer solution Take 3 mLs (2.5 mg total) by nebulization every 6 (six) hours as needed for wheezing or shortness of breath. 75 mL 12   albuterol (VENTOLIN HFA) 108 (90 Base) MCG/ACT inhaler Inhale 2 puffs into the  lungs every 6 (six) hours as needed for wheezing or shortness of breath. 8 g 5   ALPRAZolam (XANAX) 0.25 MG tablet Take 0.25 mg by mouth at bedtime.     Ascorbic Acid (VITAMIN C) 1000 MG tablet Take 2,000 mg by mouth 2 (two) times daily.     B Complex Vitamins (B COMPLEX 50 PO) Take 1 tablet by mouth daily.       benzonatate (TESSALON) 200 MG capsule Take 1 capsule (200 mg total) by mouth 3 (three) times daily as needed for cough. 30 capsule 0   budesonide (RHINOCORT AQUA) 32 MCG/ACT nasal spray Place 1 spray into both nostrils daily as needed for rhinitis.     Calcium-Magnesium-Zinc (CAL-MAG-ZINC PO) Take 2 tablets by mouth at bedtime.     Cholecalciferol (VITAMIN D3) 5000 units CAPS Take 15,000 Units by mouth at bedtime.     Coenzyme Q10 (CO Q 10) 100 MG CAPS Take 100 mg by mouth every evening.      Digestive Enzymes (ENZYME DIGEST) CAPS Take 1 capsule by mouth 2 (two) times daily with a meal.      guaiFENesin-dextromethorphan (ROBITUSSIN DM) 100-10 MG/5ML syrup Take 10 mLs by mouth every 4 (four) hours as needed for cough. 118 mL 0   hydrocortisone (CORTEF) 5 MG tablet Take 5 mg by mouth See admin instructions. TAKE 10MG  IN THE MORNING AND 5 MG IN THE AFTERNOON.     ibuprofen (ADVIL,MOTRIN) 200 MG tablet Take 400 mg by mouth every 6 (six) hours as needed for moderate pain.     Magnesium 400 MG TABS Take 400 mg by mouth at bedtime.     Misc Natural Products (IMMUNE FORMULA PO) Take 1 tablet by mouth 4 (four) times a week.     Multiple Vitamin (MULTIVITAMIN WITH MINERALS) TABS tablet Take 3 tablets by mouth daily.      Omega-3 Fatty Acids (OMEGA 3 PO) Take 2 capsules by mouth 2 (two) times daily.     Polyethyl Glycol-Propyl Glycol 0.4-0.3 % SOLN Place 1 drop into both eyes 4 (four) times daily.      polyvinyl alcohol (LIQUIFILM TEARS) 1.4 % ophthalmic solution Place 1 drop into both eyes 4 (four) times daily. 15 mL 0   Probiotic Product (PROBIOTIC PO) Take 1 capsule by mouth every evening.      thyroid (ARMOUR) 90 MG tablet Takes 35 mg and 45 mg at bedtime.     vitamin A UNIT capsule Take 10,000 Units by mouth every evening.      zinc sulfate 220 (50 Zn) MG capsule Take 1 capsule (220 mg total) by mouth daily. 30 capsule 0   thyroid (ARMOUR) 30 MG tablet Take 45 mg every morning and 30 mg every  afternoon (Patient not taking: Reported on 12/13/2020)     No facility-administered medications prior to visit.    Review of Systems  Review of Systems  Constitutional:  Positive for fatigue.  Respiratory:  Positive for shortness of breath. Negative for cough, chest tightness and wheezing.        DOE  Cardiovascular:  Positive for leg swelling.  Neurological:  Positive for weakness.    Physical Exam  BP 106/64 (BP Location: Left Arm, Patient Position: Sitting, Cuff Size: Normal)   Pulse 74   Temp 97.9 F (36.6 C) (Oral)   Ht 5\' 3"  (1.6 m)   Wt 176 lb 12.8 oz (80.2 kg)   SpO2 98%   BMI 31.32 kg/m  Physical Exam Constitutional:  General: She is not in acute distress.    Appearance: Normal appearance.  Cardiovascular:     Rate and Rhythm: Normal rate and regular rhythm.     Comments: + 1 BLE pedal edema Pulmonary:     Effort: Pulmonary effort is normal.     Breath sounds: Rales present. No wheezing or rhonchi.     Comments: Dyspneic with ambulation; O2 94% RA on exertion  Neurological:     General: No focal deficit present.     Mental Status: She is alert and oriented to person, place, and time. Mental status is at baseline.  Psychiatric:        Mood and Affect: Mood normal.        Behavior: Behavior normal.        Thought Content: Thought content normal.        Judgment: Judgment normal.     Lab Results:  CBC    Component Value Date/Time   WBC 8.8 11/25/2020 0417   RBC 4.07 11/25/2020 0417   HGB 12.8 11/25/2020 0417   HCT 36.3 11/25/2020 0417   PLT 318 11/25/2020 0417   MCV 89.2 11/25/2020 0417   MCH 31.4 11/25/2020 0417   MCHC 35.3 11/25/2020 0417   RDW 12.1 11/25/2020 0417   LYMPHSABS 1.3 11/24/2020 0414   MONOABS 0.9 11/24/2020 0414   EOSABS 0.0 11/24/2020 0414   BASOSABS 0.0 11/24/2020 0414    BMET    Component Value Date/Time   NA 136 11/25/2020 0417   K 3.9 11/25/2020 0417   CL 108 11/25/2020 0417   CO2 25 11/25/2020 0417   GLUCOSE 154  (H) 11/25/2020 0417   BUN 18 11/25/2020 0417   CREATININE 0.65 11/26/2020 0347   CALCIUM 8.7 (L) 11/25/2020 0417   GFRNONAA >60 11/26/2020 0347   GFRAA >60 03/09/2016 1352    BNP No results found for: BNP  ProBNP No results found for: PROBNP  Imaging: DG Chest 2 View  Result Date: 11/19/2020 CLINICAL DATA:  Dyspnea, COVID positive EXAM: CHEST - 2 VIEW COMPARISON:  05/16/2020 FINDINGS: Patchy peripheral asymmetric pulmonary infiltrates have developed within the peripheral right mid lung zone and lung bases bilaterally, likely infectious or inflammatory in the acute setting. These are compatible with changes seen in the setting of acute COVID pneumonia. No pneumothorax or pleural effusion. Cardiac size within normal limits. Pulmonary vascularity is normal. No acute bone abnormality peer IMPRESSION: Interval development of patchy bilateral pulmonary infiltrates, likely infectious or inflammatory and compatible with those seen in acute COVID pneumonia. Electronically Signed   By: Helyn Numbers MD   On: 11/19/2020 16:08   DG Chest Port 1 View  Result Date: 11/24/2020 CLINICAL DATA:  COVID-19 positive.  Respiratory failure EXAM: PORTABLE CHEST 1 VIEW COMPARISON:  11/21/2020 FINDINGS: Stable heart size. Atherosclerotic calcification of the aortic knob. Patchy bilateral interstitial opacities without interval progression from the previous exam. No pleural effusion or pneumothorax. IMPRESSION: Patchy bilateral interstitial opacities without interval progression from the previous exam. Electronically Signed   By: Duanne Guess D.O.   On: 11/24/2020 14:06   DG CHEST PORT 1 VIEW  Result Date: 11/21/2020 CLINICAL DATA:  Hypoxemia, COVID-19 positive. EXAM: PORTABLE CHEST 1 VIEW COMPARISON:  November 19, 2020. FINDINGS: The heart size and mediastinal contours are within normal limits. Stable patchy peripheral airspace opacities are noted most consistent with COVID-19 pneumonia. The visualized skeletal  structures are unremarkable. IMPRESSION: Stable bilateral lung opacities as described above. Electronically Signed   By: Fayrene Fearing  Christen Butter M.D.   On: 11/21/2020 10:18   DG Chest Port 1V same Day  Result Date: 11/21/2020 CLINICAL DATA:  Hypoxemia, COVID-19 positivity EXAM: PORTABLE CHEST 1 VIEW COMPARISON:  Film from earlier in the same day. FINDINGS: Cardiac shadow is stable. Aortic calcifications are again identified. Patchy bilateral airspace opacities are noted slightly improved on the left when compared with the prior exam. No new focal abnormality is noted. IMPRESSION: Slight improved aeration when compare with the prior exam. Electronically Signed   By: Alcide Clever M.D.   On: 11/21/2020 19:12     Assessment & Plan:   Pneumonia due to COVID-19 virus - Hospitalized in early August 2022 for 11 days d/t COVID-19 pneumonia requiring high flow oxygen. She was unvaccinated at the time. Chest x-ray showed patchy bilateral pulmonary infiltrates. She was treated with IV Solu-Medrol as well as remdesivir and baricitinib. She was discharged on 3L oxygen and prednisone taper. She is doing better today but still has residual shortness of breath, fatigue and weakness. She had rales to bilateral lung bases on exam. She is requiring less oxygen. Advised she use IS 5-10 deep breaths every hour while awake. Recommend repeating CXR in 4 weeks at follow-up.   Acute hypoxemic respiratory failure (HCC) 12/13/2020 Walked on RA x 1 lap = approx 277ft at slow paced and stopped d/t dizziness and weakness, no oxygen desaturations noted. O2 remains at 94% RA.  - Recommend she use 0-1L oxygen at rest and 1-2L on exertion. She can taper to maintained O2 >88-90%   40 mins spent on case, >50% face to face   Glenford Bayley, NP 12/13/2020

## 2020-12-13 NOTE — Patient Instructions (Addendum)
Recommendations: - Use albuterol nebulizer twice a day x 1-2 weeks; then use as needed only for breakthrough shortness of breath or wheezing  - Continue to take robitussin at bedtime as needed for cough - Slowly increase activity level, avoid heavy exertion   Oxygen: - Use ____0-1___ at rest;  ___1-2________ on exertion - Taper OXYGEN by 1L weekly as tolerated to keep O2 >88-90%. Once you are down to 1L you can try coming off oxygen for 1-2 hours at a time and if saturations remain stable >90% then use as needed with exertion - Continue to wear oxygen to bedtime   Orders: - CXR in 4 weeks  - Walk test in office today   Follow-up: - 3-4 weeks with Waynetta Sandy NP or she can see Dr. Isaiah Serge on October 11th or 12th  (CXR prior)

## 2020-12-14 NOTE — Addendum Note (Signed)
Addended by: Arvilla Market on: 12/14/2020 08:32 AM   Modules accepted: Orders

## 2021-01-03 ENCOUNTER — Ambulatory Visit (INDEPENDENT_AMBULATORY_CARE_PROVIDER_SITE_OTHER): Payer: Medicare Other

## 2021-01-03 ENCOUNTER — Ambulatory Visit (INDEPENDENT_AMBULATORY_CARE_PROVIDER_SITE_OTHER): Payer: Medicare Other | Admitting: Primary Care

## 2021-01-03 ENCOUNTER — Other Ambulatory Visit: Payer: Self-pay

## 2021-01-03 ENCOUNTER — Encounter: Payer: Self-pay | Admitting: Primary Care

## 2021-01-03 VITALS — BP 120/80 | HR 98 | Temp 98.1°F | Ht 63.0 in | Wt 177.0 lb

## 2021-01-03 DIAGNOSIS — R0602 Shortness of breath: Secondary | ICD-10-CM | POA: Diagnosis not present

## 2021-01-03 DIAGNOSIS — J849 Interstitial pulmonary disease, unspecified: Secondary | ICD-10-CM

## 2021-01-03 DIAGNOSIS — U071 COVID-19: Secondary | ICD-10-CM

## 2021-01-03 DIAGNOSIS — J9611 Chronic respiratory failure with hypoxia: Secondary | ICD-10-CM

## 2021-01-03 DIAGNOSIS — J1282 Pneumonia due to coronavirus disease 2019: Secondary | ICD-10-CM

## 2021-01-03 MED ORDER — ALBUTEROL SULFATE (2.5 MG/3ML) 0.083% IN NEBU: 2.5000 mg | INHALATION_SOLUTION | Freq: Four times a day (QID) | RESPIRATORY_TRACT | 3 refills | Status: DC | PRN

## 2021-01-03 NOTE — Patient Instructions (Addendum)
   Recommendations: - Continue to use IS 5-10 deep breaths every hour while awake - Continue to use Albuterol nebulizer every 8-12 hours for shortness of breath - Continue 2L oxygen 24/7. You can come off oxygen for 30 mins and taper liter flow between 0-2L to maintained O2 >88-90% or above  - They do not have pulmonary rehab in [place], you will need to go to [facility] in [place]   Referral: - Pulmonary rehab at [facility] re: chronic respiratory failure/post covid (order placed)  Rx: - Refilled Albuterol nebulizer solution (sent)  Follow-up: - 2-3 months with Dr. Isaiah Serge

## 2021-01-03 NOTE — Progress Notes (Signed)
@Patient  ID: , female    DOB: [birth year], 70 y.o.   MRN: [id number]  Chief Complaint  Patient presents with   Follow-up    Pt states concerns for oxygen    Referring provider: [id number], FNP  HPI: 70 year old female. PMH significant for allergies, ?COPD/asthma, suspected sleep apnea, bronchitis, adrenal insufficiency, lyme's disease, angioedema. Patient of Dr. 62, seen on 05/03/20 for initial consult for dyspnea on exertion. Spirometry in 2017 showed no obstructive airway disease, needs repeat PFTs.   Previous [facility] pulmonary encounter: 11/17/2020  Patient tested positive for covid for yesterday, he symptoms started Sunday July 31st. Initially started out with cough and sinus congestion. Her cough is dry. She has some associated back discomfort. She is also experiencing nausea, diarrhea, dry mouth and decreased appetite. No significant shortness of breath, chest tightness or wheezing. Last episode of diarrhea was yesterday. She is taking mucinex 1200mg  twice a day. She picked up delsym cough syrup yesterday. She is currently on Augmentin. She takes hydrocortisone for adrenal insufficiency.  She is not on any cholesterol or blood pressure medications.  No fevers, chills, sweats, loss of smell/taste.    12/13/2020 Patient presents today for hospital follow-up. She was admitted at [facility] from 11/19/2020 - 11/28/2020 for Covid-19.  She originally presented to ED with complaints of generalized weakness, poor oral intake with nausea, cough and some difficulty breathing.  She tested positive for COVID on 8/4.  She was unable to get Molnupiravir that was prescribed by our office on 8/5 as her pharmacy did not carry medication.  Chest x-ray showed patchy bilateral pulmonary infiltrates compatible with COVID-pneumonia.  She was treated with IV Solu-Medrol as well as remdesivir and baricitinib.  Required up to 15 L high flow nasal cannula oxygen and was weaned to 3 L at  discharge.  She was sent home on oral prednisone.  She had not been previously vaccinated.  She is doing alright. Her breathing is better but not back to baseline. She has residual weakness and fatigue. She has been taking robitussin at night for occasional cough and using albuterol nebulizer 2-3 times a day. She finished 5 day course of oral prednisone last week. She has been wearing 3L oxygen continuously since discharge. She is on hydrocortisone for adrenal insufficieny. She has some lower extremity edema that improved with elevation. Denies f/c/s, productive cough, chest tightness or wheezing.    01/03/2021- Interim hx  Patient presents today for post covid follow-up. She had covid in August 2022 and was hospitalized for 11 days requiring high flow oxygen. She was unvaccinated at the time. CXR showed patchy bilateral pulmonary infiltrates. Treated with solumedrol as well as remdesivir and baricitinib. She was discharged on 3L oxygen.   During our last office visit she was walked on room air and O2 maintained 94% RA but she complained of dizziness and weakness. She is difficulty weaning her oxygen, reports feeling "foggy/loopy" in the head when she does. She can go 30 mins during the day without oxygen at rest. She is using incentive spirometry five times a day. She uses albuterol nebulizer 2 times day. She saw her dermatologist Monday and started taking nutrofol hair vitamin for hair loss.    Allergies  Allergen Reactions   Avelox [Moxifloxacin Hcl In Nacl] Anaphylaxis   Biaxin [Clarithromycin] Anaphylaxis   Lactose Intolerance (Gi) Other (See Comments)    Bloating, gas, congestion   Tobramycin Other (See Comments)    Severe headaches  Yeast-Related Products Swelling    Tongue swelling /candida     There is no immunization history on file for this patient.  Past Medical History:  Diagnosis Date   Adrenal insufficiency (HCC)    Thyroid disease     Tobacco History: Social History    Tobacco Use  Smoking Status Never  Smokeless Tobacco Never   Counseling given: Not Answered   Outpatient Medications Prior to Visit  Medication Sig Dispense Refill   albuterol (VENTOLIN HFA) 108 (90 Base) MCG/ACT inhaler Inhale 2 puffs into the lungs every 6 (six) hours as needed for wheezing or shortness of breath. 8 g 5   ALPRAZolam (XANAX) 0.25 MG tablet Take 0.25 mg by mouth at bedtime.     Ascorbic Acid (VITAMIN C) 1000 MG tablet Take 2,000 mg by mouth 2 (two) times daily.     B Complex Vitamins (B COMPLEX 50 PO) Take 1 tablet by mouth daily.      Calcium-Magnesium-Zinc (CAL-MAG-ZINC PO) Take 2 tablets by mouth at bedtime.     Cholecalciferol (VITAMIN D3) 5000 units CAPS Take 15,000 Units by mouth at bedtime.     Coenzyme Q10 (CO Q 10) 100 MG CAPS Take 100 mg by mouth every evening.      Digestive Enzymes (ENZYME DIGEST) CAPS Take 1 capsule by mouth 2 (two) times daily with a meal.      guaiFENesin-dextromethorphan (ROBITUSSIN DM) 100-10 MG/5ML syrup Take 10 mLs by mouth every 4 (four) hours as needed for cough. 118 mL 0   hydrocortisone (CORTEF) 5 MG tablet Take 5 mg by mouth See admin instructions. TAKE 10MG  IN THE MORNING AND 5 MG IN THE AFTERNOON.     ibuprofen (ADVIL,MOTRIN) 200 MG tablet Take 400 mg by mouth every 6 (six) hours as needed for moderate pain.     Magnesium 400 MG TABS Take 400 mg by mouth at bedtime.     Multiple Vitamin (MULTIVITAMIN WITH MINERALS) TABS tablet Take 3 tablets by mouth daily.      Omega-3 Fatty Acids (OMEGA 3 PO) Take 2 capsules by mouth 2 (two) times daily.     Polyethyl Glycol-Propyl Glycol 0.4-0.3 % SOLN Place 1 drop into both eyes 4 (four) times daily.      Probiotic Product (PROBIOTIC PO) Take 1 capsule by mouth every evening.      thyroid (ARMOUR) 90 MG tablet Takes 35 mg and 45 mg at bedtime.     vitamin A UNIT capsule Take 10,000 Units by mouth every evening.      albuterol (PROVENTIL) (2.5 MG/3ML) 0.083% nebulizer solution Take 3  mLs (2.5 mg total) by nebulization every 6 (six) hours as needed for wheezing or shortness of breath. 75 mL 12   benzonatate (TESSALON) 200 MG capsule Take 1 capsule (200 mg total) by mouth 3 (three) times daily as needed for cough. (Patient not taking: Reported on 01/03/2021) 30 capsule 0   budesonide (RHINOCORT AQUA) 32 MCG/ACT nasal spray Place 1 spray into both nostrils daily as needed for rhinitis. (Patient not taking: Reported on 01/03/2021)     Misc Natural Products (IMMUNE FORMULA PO) Take 1 tablet by mouth 4 (four) times a week. (Patient not taking: Reported on 01/03/2021)     polyvinyl alcohol (LIQUIFILM TEARS) 1.4 % ophthalmic solution Place 1 drop into both eyes 4 (four) times daily. (Patient not taking: Reported on 01/03/2021) 15 mL 0   No facility-administered medications prior to visit.    Review of Systems  Review of  Systems  Constitutional:  Positive for fatigue. Negative for appetite change, chills, diaphoresis and fever.  HENT: Negative.    Respiratory:  Positive for shortness of breath.        Dyspnea on exertion   Cardiovascular: Negative.     Physical Exam  BP 120/80 (BP Location: Right Arm, Patient Position: Sitting, Cuff Size: Normal)   Pulse 98   Temp 98.1 F (36.7 C) (Oral)   Ht 5\' 3"  (1.6 m)   Wt 177 lb (80.3 kg)   SpO2 98%   BMI 31.35 kg/m  Physical Exam Constitutional:      Appearance: Normal appearance.  Cardiovascular:     Rate and Rhythm: Normal rate and regular rhythm.     Heart sounds: No murmur heard.    Comments: Trace pedal edema Pulmonary:     Effort: Pulmonary effort is normal.     Breath sounds: No wheezing or rhonchi.     Comments: Scattered rales R>L; 2L oxygen  Neurological:     Mental Status: She is alert.     Lab Results:  CBC    Component Value Date/Time   WBC 8.8 11/25/2020 0417   RBC 4.07 11/25/2020 0417   HGB 12.8 11/25/2020 0417   HCT 36.3 11/25/2020 0417   PLT 318 11/25/2020 0417   MCV 89.2 11/25/2020 0417   MCH  31.4 11/25/2020 0417   MCHC 35.3 11/25/2020 0417   RDW 12.1 11/25/2020 0417   LYMPHSABS 1.3 11/24/2020 0414   MONOABS 0.9 11/24/2020 0414   EOSABS 0.0 11/24/2020 0414   BASOSABS 0.0 11/24/2020 0414    BMET    Component Value Date/Time   NA 136 11/25/2020 0417   K 3.9 11/25/2020 0417   CL 108 11/25/2020 0417   CO2 25 11/25/2020 0417   GLUCOSE 154 (H) 11/25/2020 0417   BUN 18 11/25/2020 0417   CREATININE 0.65 11/26/2020 0347   CALCIUM 8.7 (L) 11/25/2020 0417   GFRNONAA >60 11/26/2020 0347   GFRAA >60 03/09/2016 1352    BNP No results found for: BNP  ProBNP No results found for: PROBNP  Imaging: DG Chest 2 View  Result Date: 01/03/2021 CLINICAL DATA:  COVID-19 pneumonia, follow-up EXAM: CHEST - 2 VIEW COMPARISON:  11/24/2020 FINDINGS: Normal heart size, mediastinal contours, and pulmonary vascularity. Peripheral infiltrates in the mid to lower lungs bilaterally greater on RIGHT, little changed. No new areas of consolidation, pleural effusion, or pneumothorax. Mild thoracic scoliosis. IMPRESSION: Persistent BILATERAL pulmonary infiltrates peripherally. Electronically Signed   By: 01/24/2021 M.D.   On: 01/03/2021 14:58     Assessment & Plan:   Pneumonia due to COVID-19 virus Patient had covid in August 2022 and was hospitalized for 11 days requiring high flow oxygen. She was unvaccinated at the time. CXR showed patchy bilateral pulmonary infiltrates. Treated with solumedrol as well as remdesivir and baricitinib. She continues to have fatigue, weakness and dyspnea symptoms with exertion. She is still requiring 2L oxygen continuously. Repeat CXR today showed persistent peripheral infiltrates. Advised she continue to use Incentive spirometry 5-10 deep breaths every hour while awake and albuterol nebulizer every 8-12 hour as needed for shortness of breath and wheezing. We will order an HRCT to evaluate for post covid fibrosis and refer to pulmonary rehab at [facility].    [facility], NP 01/03/2021

## 2021-01-03 NOTE — Assessment & Plan Note (Signed)
Patient had covid in August 2022 and was hospitalized for 11 days requiring high flow oxygen. She was unvaccinated at the time. CXR showed patchy bilateral pulmonary infiltrates. Treated with solumedrol as well as remdesivir and baricitinib. She continues to have fatigue, weakness and dyspnea symptoms with exertion. She is still requiring 2L oxygen continuously. Repeat CXR today showed persistent peripheral infiltrates. Advised she continue to use Incentive spirometry 5-10 deep breaths every hour while awake and albuterol nebulizer every 8-12 hour as needed for shortness of breath and wheezing. 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

## 2021-01-03 NOTE — Progress Notes (Signed)
Please let patient know CXR showed persistent bilateral pulmonary infiltrates peripherally. No new areas of consolidation. I can going to get CT chest to better evaluate, likely scarring from covid. No changes to plan. I asked Dr. Isaiah Serge to review as well

## 2021-01-04 ENCOUNTER — Other Ambulatory Visit (HOSPITAL_COMMUNITY): Payer: Self-pay

## 2021-01-04 DIAGNOSIS — R0609 Other forms of dyspnea: Secondary | ICD-10-CM

## 2021-01-04 DIAGNOSIS — R0600 Dyspnea, unspecified: Secondary | ICD-10-CM

## 2021-01-04 DIAGNOSIS — U099 Post covid-19 condition, unspecified: Secondary | ICD-10-CM

## 2021-01-31 ENCOUNTER — Ambulatory Visit (HOSPITAL_COMMUNITY): Payer: Medicare Other

## 2021-02-01 ENCOUNTER — Other Ambulatory Visit: Payer: Self-pay

## 2021-02-01 ENCOUNTER — Ambulatory Visit (HOSPITAL_COMMUNITY)
Admission: RE | Admit: 2021-02-01 | Discharge: 2021-02-01 | Disposition: A | Discharge status: Home / Self Care | Payer: Medicare Other | Source: Ambulatory Visit | Attending: Primary Care | Admitting: Primary Care

## 2021-02-01 DIAGNOSIS — R0602 Shortness of breath: Secondary | ICD-10-CM | POA: Insufficient documentation

## 2021-02-01 DIAGNOSIS — J849 Interstitial pulmonary disease, unspecified: Secondary | ICD-10-CM | POA: Diagnosis present

## 2021-02-07 ENCOUNTER — Telehealth: Payer: Self-pay | Admitting: Pulmonary Disease

## 2021-02-07 ENCOUNTER — Other Ambulatory Visit: Payer: Self-pay | Admitting: *Deleted

## 2021-02-07 DIAGNOSIS — Z8616 Personal history of COVID-19: Secondary | ICD-10-CM

## 2021-02-07 DIAGNOSIS — R0602 Shortness of breath: Secondary | ICD-10-CM

## 2021-02-07 NOTE — Telephone Encounter (Signed)
I attempted to call the patient and the phone rang and rang with no response. The CT has not been resulted. Waiting on a call back. The patient will get a call once the scan is resulted.

## 2021-02-07 NOTE — Progress Notes (Signed)
Please let patient know CT chest showed some lung changes and scarring consistent with post covid pneumonia, new from March 2022.   Additional findings coronary artery disease and enlarged pulmonic truck. Needs echocardiogram re: shortness of breath, hx covid-19 (please order).   She has an apt in Nov with Dr. Isaiah Serge, will review further at that visit.

## 2021-02-08 ENCOUNTER — Other Ambulatory Visit: Payer: Self-pay

## 2021-02-08 ENCOUNTER — Encounter (HOSPITAL_COMMUNITY)
Admission: RE | Admit: 2021-02-08 | Discharge: 2021-02-08 | Disposition: A | Discharge status: Home / Self Care | Payer: Medicare Other | Source: Ambulatory Visit | Attending: Pulmonary Disease | Admitting: Pulmonary Disease

## 2021-02-08 ENCOUNTER — Encounter (HOSPITAL_COMMUNITY): Payer: Self-pay

## 2021-02-08 VITALS — BP 124/60 | HR 84 | Ht 63.0 in | Wt 184.5 lb

## 2021-02-08 DIAGNOSIS — U099 Post covid-19 condition, unspecified: Secondary | ICD-10-CM | POA: Diagnosis present

## 2021-02-08 DIAGNOSIS — R0609 Other forms of dyspnea: Secondary | ICD-10-CM | POA: Diagnosis not present

## 2021-02-08 NOTE — Progress Notes (Signed)
Pulmonary Individual Treatment Plan  Patient Details  Name: Monica Harrison MRN: [id number] Date of Birth: 1950-06-20 Referring Provider:   [place] from 02/08/2021 in [place]  Referring Provider Dr. Vaughan Browner       Initial Encounter Date:  Flowsheet Row PULMONARY REHAB OTHER RESP ORIENTATION from 02/08/2021 in [place]  Date 02/08/21       Visit Diagnosis: DOE (dyspnea on exertion)  Post covid-19 condition, unspecified  Patient's Home Medications on Admission:   Current Outpatient Medications:    albuterol (PROVENTIL) (2.5 MG/3ML) 0.083% nebulizer solution, Take 3 mLs (2.5 mg total) by nebulization every 6 (six) hours as needed for wheezing or shortness of breath., Disp: 140 mL, Rfl: 3   albuterol (VENTOLIN HFA) 108 (90 Base) MCG/ACT inhaler, Inhale 2 puffs into the lungs every 6 (six) hours as needed for wheezing or shortness of breath., Disp: 8 g, Rfl: 5   ALPRAZolam (XANAX) 0.25 MG tablet, Take 0.25 mg by mouth at bedtime., Disp: , Rfl:    Ascorbic Acid (VITAMIN C) 1000 MG tablet, Take 2,000 mg by mouth 2 (two) times daily., Disp: , Rfl:    B Complex Vitamins (B COMPLEX 50 PO), Take 1 tablet by mouth daily. , Disp: , Rfl:    benzonatate (TESSALON) 200 MG capsule, Take 1 capsule (200 mg total) by mouth 3 (three) times daily as needed for cough. (Patient not taking: Reported on 01/03/2021), Disp: 30 capsule, Rfl: 0   budesonide (RHINOCORT AQUA) 32 MCG/ACT nasal spray, Place 1 spray into both nostrils daily as needed for rhinitis. (Patient not taking: Reported on 01/03/2021), Disp: , Rfl:    Calcium-Magnesium-Zinc (CAL-MAG-ZINC PO), Take 2 tablets by mouth at bedtime., Disp: , Rfl:    Cholecalciferol (VITAMIN D3) 5000 units CAPS, Take 15,000 Units by mouth at bedtime., Disp: , Rfl:    Coenzyme Q10 (CO Q 10) 100 MG CAPS, Take 100 mg by mouth every evening. , Disp: , Rfl:    Digestive Enzymes  (ENZYME DIGEST) CAPS, Take 1 capsule by mouth 2 (two) times daily with a meal. , Disp: , Rfl:    fluconazole (DIFLUCAN) 150 MG tablet, Take 150 mg by mouth once., Disp: , Rfl:    guaiFENesin-dextromethorphan (ROBITUSSIN DM) 100-10 MG/5ML syrup, Take 10 mLs by mouth every 4 (four) hours as needed for cough., Disp: 118 mL, Rfl: 0   hydrocortisone (CORTEF) 5 MG tablet, Take 5 mg by mouth See admin instructions. TAKE 10MG  IN THE MORNING AND 5 MG IN THE AFTERNOON., Disp: , Rfl:    ibuprofen (ADVIL,MOTRIN) 200 MG tablet, Take 400 mg by mouth every 6 (six) hours as needed for moderate pain., Disp: , Rfl:    Magnesium 400 MG TABS, Take 400 mg by mouth at bedtime., Disp: , Rfl:    Misc Natural Products (IMMUNE FORMULA PO), Take 1 tablet by mouth 4 (four) times a week. (Patient not taking: Reported on 01/03/2021), Disp: , Rfl:    Multiple Vitamin (MULTIVITAMIN WITH MINERALS) TABS tablet, Take 3 tablets by mouth daily. , Disp: , Rfl:    Omega-3 Fatty Acids (OMEGA 3 PO), Take 2 capsules by mouth 2 (two) times daily., Disp: , Rfl:    Polyethyl Glycol-Propyl Glycol 0.4-0.3 % SOLN, Place 1 drop into both eyes 4 (four) times daily. , Disp: , Rfl:    predniSONE (DELTASONE) 10 MG tablet, prednisone 10 mg tablet, Disp: , Rfl:    Probiotic Product (PROBIOTIC PO), Take 1  capsule by mouth every evening. , Disp: , Rfl:    thyroid (ARMOUR) 90 MG tablet, Takes 35 mg and 45 mg at bedtime., Disp: , Rfl:    vitamin A 10000 UNIT capsule, Take 10,000 Units by mouth every evening. , Disp: , Rfl:   Past Medical History: Past Medical History:  Diagnosis Date   Adrenal insufficiency (HCC)    Thyroid disease     Tobacco Use: Social History   Tobacco Use  Smoking Status Never  Smokeless Tobacco Never    Labs: Recent Review Flowsheet Data     Labs for ITP Cardiac and Pulmonary Rehab Latest Ref Rng & Units 11/21/2020   PHART 7.350 - 7.450 7.486(H)   PCO2ART 32.0 - 48.0 mmHg 28.1(L)   HCO3 20.0 - 28.0 mmol/L 23.6    ACIDBASEDEF 0.0 - 2.0 mmol/L 2.0   O2SAT % 96.3       Capillary Blood Glucose: Lab Results  Component Value Date   GLUCAP 152 (H) 11/27/2020   GLUCAP 117 (H) 11/27/2020     Pulmonary Assessment Scores:  Pulmonary Assessment Scores     Row Name 02/08/21 0836         ADL UCSD   SOB Score total 8     Rest 0     Walk 2     Stairs 2     Bath 0     Dress 0     Shop 0       CAT Score   CAT Score 7       mMRC Score   mMRC Score 2             UCSD: Self-administered rating of dyspnea associated with activities of daily living (ADLs) 6-point scale (0 = "not at all" to 5 = "maximal or unable to do because of breathlessness")  Scoring Scores range from 0 to 120.  Minimally important difference is 5 units  CAT: CAT can identify the health impairment of COPD patients and is better correlated with disease progression.  CAT has a scoring range of zero to 40. The CAT score is classified into four groups of low (less than 10), medium (10 - 20), high (21-30) and very high (31-40) based on the impact level of disease on health status. A CAT score over 10 suggests significant symptoms.  A worsening CAT score could be explained by an exacerbation, poor medication adherence, poor inhaler technique, or progression of COPD or comorbid conditions.  CAT MCID is 2 points  mMRC: mMRC (Modified Medical Research Council) Dyspnea Scale is used to assess the degree of baseline functional disability in patients of respiratory disease due to dyspnea. No minimal important difference is established. A decrease in score of 1 point or greater is considered a positive change.   Pulmonary Function Assessment:   Exercise Target Goals: Exercise Program Goal: Individual exercise prescription set using results from initial 6 min walk test and THRR while considering  patient's activity barriers and safety.   Exercise Prescription Goal: Initial exercise prescription builds to 30-45 minutes a day of  aerobic activity, 2-3 days per week.  Home exercise guidelines will be given to patient during program as part of exercise prescription that the participant will acknowledge.  Activity Barriers & Risk Stratification:  Activity Barriers & Cardiac Risk Stratification - 02/08/21 0844       Activity Barriers & Cardiac Risk Stratification   Activity Barriers Shortness of Breath;Deconditioning    Cardiac Risk Stratification Low  6 Minute Walk:  6 Minute Walk     Row Name 02/08/21 1016         6 Minute Walk   Phase Initial     Distance 1000 feet     Walk Time 6 minutes     # of Rest Breaks 0     MPH 1.9     METS 2.16     RPE 12     Perceived Dyspnea  11     VO2 Peak 7.55     Symptoms No     Resting HR 84 bpm     Resting BP 124/60     Resting Oxygen Saturation  96 %     Exercise Oxygen Saturation  during 6 min walk 93 %     Max Ex. HR 119 bpm     Max Ex. BP 130/62     2 Minute Post BP 120/60       Interval HR   1 Minute HR 118     2 Minute HR 118     3 Minute HR 112     4 Minute HR 118     5 Minute HR 119     6 Minute HR 115     2 Minute Post HR 92     Interval Heart Rate? Yes       Interval Oxygen   Interval Oxygen? Yes     Baseline Oxygen Saturation % 96 %     1 Minute Oxygen Saturation % 95 %     1 Minute Liters of Oxygen 0 L     2 Minute Oxygen Saturation % 93 %     2 Minute Liters of Oxygen 0 L     3 Minute Oxygen Saturation % 94 %     3 Minute Liters of Oxygen 0 L     4 Minute Oxygen Saturation % 95 %     4 Minute Liters of Oxygen 0 L     5 Minute Oxygen Saturation % 95 %     5 Minute Liters of Oxygen 0 L     6 Minute Oxygen Saturation % 95 %     6 Minute Liters of Oxygen 0 L     2 Minute Post Oxygen Saturation % 98 %     2 Minute Post Liters of Oxygen 0 L              Oxygen Initial Assessment:  Oxygen Initial Assessment - 02/08/21 1022       Initial 6 min Walk   Oxygen Used None      Program Oxygen Prescription   Program  Oxygen Prescription None      Intervention   Short Term Goals To learn and exhibit compliance with exercise, home and travel O2 prescription;To learn and understand importance of monitoring SPO2 with pulse oximeter and demonstrate accurate use of the pulse oximeter.;To learn and understand importance of maintaining oxygen saturations>88%;To learn and demonstrate proper use of respiratory medications    Long  Term Goals Exhibits compliance with exercise, home  and travel O2 prescription;Verbalizes importance of monitoring SPO2 with pulse oximeter and return demonstration;Maintenance of O2 saturations>88%;Compliance with respiratory medication;Demonstrates proper use of MDI's             Oxygen Re-Evaluation:   Oxygen Discharge (Final Oxygen Re-Evaluation):   Initial Exercise Prescription:  Initial Exercise Prescription - 02/08/21 1000       Date of Initial Exercise RX and  Referring Provider   Date 02/08/21    Referring Provider Dr. Isaiah Serge    Expected Discharge Date 06/14/21      Treadmill   MPH 1    Grade 0    Minutes 17      NuStep   Level 1    SPM 60    Minutes 22      Prescription Details   Frequency (times per week) 2    Duration Progress to 30 minutes of continuous aerobic without signs/symptoms of physical distress      Intensity   THRR 40-80% of Max Heartrate 60-119    Ratings of Perceived Exertion 11-15    Perceived Dyspnea 0-4      Resistance Training   Training Prescription Yes    Weight 3    Reps 10-15             Perform Capillary Blood Glucose checks as needed.  Exercise Prescription Changes:   Exercise Comments:   Exercise Goals and Review:   Exercise Goals     Row Name 02/08/21 1020             Exercise Goals   Increase Physical Activity Yes       Intervention Provide advice, education, support and counseling about physical activity/exercise needs.;Develop an individualized exercise prescription for aerobic and resistive  training based on initial evaluation findings, risk stratification, comorbidities and participant's personal goals.       Expected Outcomes Short Term: Attend rehab on a regular basis to increase amount of physical activity.;Long Term: Add in home exercise to make exercise part of routine and to increase amount of physical activity.;Long Term: Exercising regularly at least 3-5 days a week.       Increase Strength and Stamina Yes       Intervention Provide advice, education, support and counseling about physical activity/exercise needs.;Develop an individualized exercise prescription for aerobic and resistive training based on initial evaluation findings, risk stratification, comorbidities and participant's personal goals.       Expected Outcomes Short Term: Increase workloads from initial exercise prescription for resistance, speed, and METs.;Short Term: Perform resistance training exercises routinely during rehab and add in resistance training at home;Long Term: Improve cardiorespiratory fitness, muscular endurance and strength as measured by increased METs and functional capacity ( )       Able to understand and use rate of perceived exertion (RPE) scale Yes       Intervention Provide education and explanation on how to use RPE scale       Expected Outcomes Short Term: Able to use RPE daily in rehab to express subjective intensity level;Long Term:  Able to use RPE to guide intensity level when exercising independently       Able to understand and use Dyspnea scale Yes       Intervention Provide education and explanation on how to use Dyspnea scale       Expected Outcomes Short Term: Able to use Dyspnea scale daily in rehab to express subjective sense of shortness of breath during exertion;Long Term: Able to use Dyspnea scale to guide intensity level when exercising independently       Knowledge and understanding of Target Heart Rate Range (THRR) Yes       Intervention Provide education and  explanation of THRR including how the numbers were predicted and where they are located for reference       Expected Outcomes Short Term: Able to state/look up THRR;Long Term: Able to use THRR  to govern intensity when exercising independently;Short Term: Able to use daily as guideline for intensity in rehab       Understanding of Exercise Prescription Yes       Intervention Provide education, explanation, and written materials on patient's individual exercise prescription       Expected Outcomes Short Term: Able to explain program exercise prescription;Long Term: Able to explain home exercise prescription to exercise independently                Exercise Goals Re-Evaluation :   Discharge Exercise Prescription (Final Exercise Prescription Changes):   Nutrition:  Target Goals: Understanding of nutrition guidelines, daily intake of sodium 1500mg , cholesterol 200mg , calories 30% from fat and 7% or less from saturated fats, daily to have 5 or more servings of fruits and vegetables.  Biometrics:  Pre Biometrics - 02/08/21 1021       Pre Biometrics   Height 5\' 3"  (1.6 m)    Weight 83.7 kg    Waist Circumference 43.5 inches    Hip Circumference 41 inches    Waist to Hip Ratio 1.06 %    BMI (Calculated) 32.7    Triceps Skinfold 23 mm    % Body Fat 44.1 %    Grip Strength 17.6 kg    Flexibility 6.5 in    Single Leg Stand 7.52 seconds              Nutrition Therapy Plan and Nutrition Goals:   Nutrition Assessments:  Nutrition Assessments - 02/08/21 0851       MEDFICTS Scores   Pre Score 31            MEDIFICTS Score Key: ?70 Need to make dietary changes  40-70 Heart Healthy Diet ? 40 Therapeutic Level Cholesterol Diet   Picture Your Plate Scores: 02/10/21 Unhealthy dietary pattern with much room for improvement. 41-50 Dietary pattern unlikely to meet recommendations for good health and room for improvement. 51-60 More healthful dietary pattern, with some room  for improvement.  >60 Healthy dietary pattern, although there may be some specific behaviors that could be improved.    Nutrition Goals Re-Evaluation:   Nutrition Goals Discharge (Final Nutrition Goals Re-Evaluation):   Psychosocial: Target Goals: Acknowledge presence or absence of significant depression and/or stress, maximize coping skills, provide positive support system. Participant is able to verbalize types and ability to use techniques and skills needed for reducing stress and depression.  Initial Review & Psychosocial Screening:  Initial Psych Review & Screening - 02/08/21 0849       Initial Review   Current issues with None Identified      Family Dynamics   Good Support System? Yes    Comments Her husband is her main support system.      Barriers   Psychosocial barriers to participate in program There are no identifiable barriers or psychosocial needs.      Screening Interventions   Interventions Encouraged to exercise    Expected Outcomes Long Term Goal: Stressors or current issues are controlled or eliminated.;Short Term goal: Identification and review with participant of any Quality of Life or Depression concerns found by scoring the questionnaire.;Long Term goal: The participant improves quality of Life and PHQ9 Scores as seen by post scores and/or verbalization of changes             Quality of Life Scores:  Quality of Life - 02/08/21 1023       Quality of Life   Select Quality of Life  Quality of Life Scores   Health/Function Pre 21 %    Socioeconomic Pre 21 %    Psych/Spiritual Pre 21 %    Family Pre 21 %    GLOBAL Pre 21 %            Scores of 19 and below usually indicate a poorer quality of life in these areas.  A difference of  2-3 points is a clinically meaningful difference.  A difference of 2-3 points in the total score of the Quality of Life Index has been associated with significant improvement in overall quality of life,  self-image, physical symptoms, and general health in studies assessing change in quality of life.   PHQ-9: Recent Review Flowsheet Data     Depression screen [facility] 2/9 02/08/2021   Decreased Interest 1   Down, Depressed, Hopeless 0   PHQ - 2 Score 1   Altered sleeping 0   Tired, decreased energy 1   Change in appetite 0   Feeling bad or failure about yourself  0   Trouble concentrating 0   Moving slowly or fidgety/restless 0   Suicidal thoughts 0   PHQ-9 Score 2   Difficult doing work/chores Not difficult at all      Interpretation of Total Score  Total Score Depression Severity:  1-4 = Minimal depression, 5-9 = Mild depression, 10-14 = Moderate depression, 15-19 = Moderately severe depression, 20-27 = Severe depression   Psychosocial Evaluation and Intervention:  Psychosocial Evaluation - 02/08/21 0958       Psychosocial Evaluation & Interventions   Interventions Stress management education;Relaxation education;Encouraged to exercise with the program and follow exercise prescription    Comments Pt has no barriers to completing rehab. She has no identifiable psychosocial issues. She does take xanax to help her sleep. She has adrenal gland insufficiency, and the medications that she takes for this causes her to no sleep well. She has been short of breath and easily fatigued since she had COVID in August. This has also caused a lot of her hair to fall out which does cause her some stress. She reports that she copes well and has a good support system from her husband. Her goals while in the program are to decrease her shortness of breath with exertion and to lose about 12 lbs. She reports that she has never exercised before but she is willing to do whatever it takes to feel better again. She is optimistic that this program will help her reach her goals.    Expected Outcomes Pt will continue to not have any identifiable psychosocial issues.    Continue Psychosocial Services  No Follow up  required             Psychosocial Re-Evaluation:   Psychosocial Discharge (Final Psychosocial Re-Evaluation):    Education: Education Goals: Education classes will be provided on a weekly basis, covering required topics. Participant will state understanding/return demonstration of topics presented.  Learning Barriers/Preferences:  Learning Barriers/Preferences - 02/08/21 0854       Learning Barriers/Preferences   Learning Barriers None    Learning Preferences Written Material;Individual Instruction             Education Topics: How Lungs Work and Diseases: - Discuss the anatomy of the lungs and diseases that can affect the lungs, such as COPD.   Exercise: -Discuss the importance of exercise, FITT principles of exercise, normal and abnormal responses to exercise, and how to exercise safely.   Environmental Irritants: -Discuss types of  environmental irritants and how to limit exposure to environmental irritants.   Meds/Inhalers and oxygen: - Discuss respiratory medications, definition of an inhaler and oxygen, and the proper way to use an inhaler and oxygen.   Energy Saving Techniques: - Discuss methods to conserve energy and decrease shortness of breath when performing activities of daily living.    Bronchial Hygiene / Breathing Techniques: - Discuss breathing mechanics, pursed-lip breathing technique,  proper posture, effective ways to clear airways, and other functional breathing techniques   Cleaning Equipment: - Provides group verbal and written instruction about the health risks of elevated stress, cause of high stress, and healthy ways to reduce stress.   Nutrition I: Fats: - Discuss the types of cholesterol, what cholesterol does to the body, and how cholesterol levels can be controlled.   Nutrition II: Labels: -Discuss the different components of food labels and how to read food labels.   Respiratory Infections: - Discuss the signs and  symptoms of respiratory infections, ways to prevent respiratory infections, and the importance of seeking medical treatment when having a respiratory infection.   Stress I: Signs and Symptoms: - Discuss the causes of stress, how stress may lead to anxiety and depression, and ways to limit stress.   Stress II: Relaxation: -Discuss relaxation techniques to limit stress.   Oxygen for Home/Travel: - Discuss how to prepare for travel when on oxygen and proper ways to transport and store oxygen to ensure safety.   Knowledge Questionnaire Score:  Knowledge Questionnaire Score - 02/08/21 0855       Knowledge Questionnaire Score   Pre Score 18/18             Core Components/Risk Factors/Patient Goals at Admission:  Personal Goals and Risk Factors at Admission - 02/08/21 0858       Core Components/Risk Factors/Patient Goals on Admission    Weight Management Yes;Weight Loss    Intervention Weight Management: Develop a combined nutrition and exercise program designed to reach desired caloric intake, while maintaining appropriate intake of nutrient and fiber, sodium and fats, and appropriate energy expenditure required for the weight goal.;Weight Management: Provide education and appropriate resources to help participant work on and attain dietary goals.    Expected Outcomes Short Term: Continue to assess and modify interventions until short term weight is achieved;Long Term: Adherence to nutrition and physical activity/exercise program aimed toward attainment of established weight goal;Weight Loss: Understanding of general recommendations for a balanced deficit meal plan, which promotes 1-2 lb weight loss per week and includes a negative energy balance of [phone] kcal/d;Understanding recommendations for meals to include 15-35% energy as protein, 25-35% energy from fat, 35-60% energy from carbohydrates, less than 200mg  of dietary cholesterol, 20-35 gm of total fiber daily;Understanding of  distribution of calorie intake throughout the day with the consumption of 4-5 meals/snacks    Improve shortness of breath with ADL's Yes    Intervention Provide education, individualized exercise plan and daily activity instruction to help decrease symptoms of SOB with activities of daily living.    Expected Outcomes Short Term: Improve cardiorespiratory fitness to achieve a reduction of symptoms when performing ADLs;Long Term: Be able to perform more ADLs without symptoms or delay the onset of symptoms             Core Components/Risk Factors/Patient Goals Review:    Core Components/Risk Factors/Patient Goals at Discharge (Final Review):    ITP Comments:   Comments: Patient arrived for 1st visit/orientation/education at 0800. Patient was referred to [facility] by  Dr. Isaiah Serge due to DOE (R06.00) and post covid-19 condition, unspecified (U09.9). During orientation advised patient on arrival and appointment times what to wear, what to do before, during and after exercise. Reviewed attendance and class policy.  Pt is scheduled to return Pulmonary Rehab on 02/13/2021 at 1045. Pt was advised to come to class 15 minutes before class starts.  Discussed RPE/Dpysnea scales. Patient participated in warm up stretches. Patient was able to complete 6 minute walk test. Patient was measured for the equipment. Discussed equipment safety with patient. Took patient pre-anthropometric measurements. Patient finished visit at 0930.

## 2021-02-09 NOTE — Telephone Encounter (Signed)
Patient would like to discuss results of CT scan. Patient phone number is [phone] h or [phone] c.

## 2021-02-09 NOTE — Telephone Encounter (Signed)
Spoke to patient, who is requesting that we send CT results to her PT.  She is not sure of PT's name.  She will call back. Nothing further needed.

## 2021-02-13 ENCOUNTER — Encounter (HOSPITAL_COMMUNITY)
Admission: RE | Admit: 2021-02-13 | Discharge: 2021-02-13 | Disposition: A | Discharge status: Home / Self Care | Payer: Medicare Other | Source: Ambulatory Visit | Attending: Pulmonary Disease | Admitting: Pulmonary Disease

## 2021-02-13 DIAGNOSIS — R0609 Other forms of dyspnea: Secondary | ICD-10-CM | POA: Insufficient documentation

## 2021-02-13 DIAGNOSIS — U099 Post covid-19 condition, unspecified: Secondary | ICD-10-CM | POA: Insufficient documentation

## 2021-02-13 NOTE — Progress Notes (Signed)
Daily Session Note  Patient Details  Name: Monica Harrison MRN: [id number] Date of Birth: 11/24/1950 Referring Provider:   [place] from 02/08/2021 in [place]  Referring Provider Dr. Vaughan Browner       Encounter Date: 02/13/2021  Check In:  Session Check In - 02/13/21 1045       Check-In   Supervising physician immediately available to respond to emergencies [facility] MD immediately available    Physician(s) Dr. Domenic Polite    Location [facility]    Staff Present Hoy Register, MS, ACSM-CEP, Exercise Physiologist;Other    Virtual Visit No    Medication changes reported     No    Fall or balance concerns reported    No    Tobacco Cessation No Change    Warm-up and Cool-down Performed as group-led instruction    Resistance Training Performed Yes    VAD Patient? No    PAD/SET Patient? No      Pain Assessment   Currently in Pain? No/denies    Multiple Pain Sites No             Capillary Blood Glucose: No results found for this or any previous visit (from the past 24 hour(s)).    Social History   Tobacco Use  Smoking Status Never  Smokeless Tobacco Never    Goals Met:  Independence with exercise equipment Exercise tolerated well No report of concerns or symptoms today Strength training completed today  Goals Unmet:  Not Applicable  Comments: checkout time is 1145   Dr. Kathie Dike is Medical Director for [facility].

## 2021-02-15 ENCOUNTER — Encounter (HOSPITAL_COMMUNITY)
Admission: RE | Admit: 2021-02-15 | Discharge: 2021-02-15 | Disposition: A | Discharge status: Home / Self Care | Payer: Medicare Other | Source: Ambulatory Visit | Attending: Pulmonary Disease | Admitting: Pulmonary Disease

## 2021-02-15 DIAGNOSIS — U099 Post covid-19 condition, unspecified: Secondary | ICD-10-CM

## 2021-02-15 DIAGNOSIS — R0609 Other forms of dyspnea: Secondary | ICD-10-CM

## 2021-02-15 NOTE — Progress Notes (Signed)
Daily Session Note  Patient Details  Name: Monica Harrison MRN: [id number] Date of Birth: [birth year] Referring Provider:   [place] from 02/08/2021 in [place]  Referring Provider Dr. Vaughan Browner       Encounter Date: 02/15/2021  Check In:  Session Check In - 02/15/21 1045       Check-In   Supervising physician immediately available to respond to emergencies [facility] MD immediately available    Physician(s) Dr. Domenic Polite    Location [facility]    Staff Present Hoy Register, MS, ACSM-CEP, Exercise Physiologist;Kina Shiffman Wynetta Emery, RN, BSN    Virtual Visit No    Medication changes reported     No    Fall or balance concerns reported    No    Tobacco Cessation No Change    Warm-up and Cool-down Performed as group-led instruction    Resistance Training Performed Yes    VAD Patient? No    PAD/SET Patient? No      Pain Assessment   Currently in Pain? No/denies    Multiple Pain Sites No             Capillary Blood Glucose: No results found for this or any previous visit (from the past 24 hour(s)).    Social History   Tobacco Use  Smoking Status Never  Smokeless Tobacco Never    Goals Met:  Proper associated with RPD/PD & O2 Sat Independence with exercise equipment Using PLB without cueing & demonstrates good technique Exercise tolerated well No report of concerns or symptoms today Strength training completed today  Goals Unmet:  Not Applicable  Comments: Check out 1145.   Dr. Kathie Dike is Medical Director for [facility].

## 2021-02-16 ENCOUNTER — Other Ambulatory Visit: Payer: Self-pay

## 2021-02-16 ENCOUNTER — Ambulatory Visit (HOSPITAL_COMMUNITY)
Admission: RE | Admit: 2021-02-16 | Discharge: 2021-02-16 | Disposition: A | Discharge status: Home / Self Care | Payer: Medicare Other | Source: Ambulatory Visit | Attending: Primary Care | Admitting: Primary Care

## 2021-02-16 DIAGNOSIS — Z8616 Personal history of COVID-19: Secondary | ICD-10-CM | POA: Diagnosis not present

## 2021-02-16 DIAGNOSIS — R0602 Shortness of breath: Secondary | ICD-10-CM | POA: Diagnosis present

## 2021-02-16 DIAGNOSIS — I517 Cardiomegaly: Secondary | ICD-10-CM | POA: Insufficient documentation

## 2021-02-16 LAB — ECHOCARDIOGRAM COMPLETE
Area-P 1/2: 2.41 cm2
S' Lateral: 2.6 cm

## 2021-02-19 NOTE — Progress Notes (Signed)
Please let patient know echocardiogram showed grade 1 DD. Normal ejection fraction. Mild left ventricular hypertrophy. Normal pulmonary artery pressure- which is what we were assessing. No evidence of pulmonary hypertension. Overall reassuring study.   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 She has visit with Dr. Isaiah Serge in about a week or so, we can review results at that visit and any further recommendations.

## 2021-02-20 ENCOUNTER — Encounter (HOSPITAL_COMMUNITY)
Admission: RE | Admit: 2021-02-20 | Discharge: 2021-02-20 | Disposition: A | Discharge status: Home / Self Care | Payer: Medicare Other | Source: Ambulatory Visit | Attending: Pulmonary Disease | Admitting: Pulmonary Disease

## 2021-02-20 VITALS — Wt 183.9 lb

## 2021-02-20 DIAGNOSIS — R0609 Other forms of dyspnea: Secondary | ICD-10-CM

## 2021-02-20 DIAGNOSIS — U099 Post covid-19 condition, unspecified: Secondary | ICD-10-CM

## 2021-02-20 NOTE — Progress Notes (Signed)
Daily Session Note  Patient Details  Name: Monica Harrison MRN: [id number] Date of Birth: December 15, 1950 Referring Provider:   Flowsheet Row PUL[facility] REHAB OTHER RESP ORIENTATION from 02/08/2021 in [place]  Referring Provider Dr. Vaughan Browner       Encounter Date: 02/20/2021  Check In:  Session Check In - 02/20/21 1045       Check-In   Supervising physician immediately available to respond to emergencies [facility] MD immediately available    Physician(s) Dr. Harl Bowie    Location [facility]    Staff Present Hoy Register, MS, ACSM-CEP, Exercise Physiologist;Heather Zigmund Daniel, Exercise Physiologist    Virtual Visit No    Medication changes reported     No    Fall or balance concerns reported    No    Tobacco Cessation No Change    Warm-up and Cool-down Performed as group-led instruction    Resistance Training Performed Yes    VAD Patient? No    PAD/SET Patient? No      Pain Assessment   Currently in Pain? No/denies             Capillary Blood Glucose: No results found for this or any previous visit (from the past 24 hour(s)).    Social History   Tobacco Use  Smoking Status Never  Smokeless Tobacco Never    Goals Met:  Independence with exercise equipment Exercise tolerated well No report of concerns or symptoms today Strength training completed today  Goals Unmet:  Not Applicable  Comments: checkout time is 1145   Dr. Kathie Dike is Medical Director for [facility].

## 2021-02-21 NOTE — Progress Notes (Signed)
Pulmonary Individual Treatment Plan  Patient Details  Name: Monica Harrison MRN: [id number] Date of Birth: 1950-06-20 Referring Provider:   [place] from 02/08/2021 in [place]  Referring Provider Dr. Vaughan Browner       Initial Encounter Date:  Flowsheet Row PULMONARY REHAB OTHER RESP ORIENTATION from 02/08/2021 in [place]  Date 02/08/21       Visit Diagnosis: DOE (dyspnea on exertion)  Post covid-19 condition, unspecified  Patient's Home Medications on Admission:   Current Outpatient Medications:    albuterol (PROVENTIL) (2.5 MG/3ML) 0.083% nebulizer solution, Take 3 mLs (2.5 mg total) by nebulization every 6 (six) hours as needed for wheezing or shortness of breath., Disp: 140 mL, Rfl: 3   albuterol (VENTOLIN HFA) 108 (90 Base) MCG/ACT inhaler, Inhale 2 puffs into the lungs every 6 (six) hours as needed for wheezing or shortness of breath., Disp: 8 g, Rfl: 5   ALPRAZolam (XANAX) 0.25 MG tablet, Take 0.25 mg by mouth at bedtime., Disp: , Rfl:    Ascorbic Acid (VITAMIN C) 1000 MG tablet, Take 2,000 mg by mouth 2 (two) times daily., Disp: , Rfl:    B Complex Vitamins (B COMPLEX 50 PO), Take 1 tablet by mouth daily. , Disp: , Rfl:    benzonatate (TESSALON) 200 MG capsule, Take 1 capsule (200 mg total) by mouth 3 (three) times daily as needed for cough. (Patient not taking: Reported on 01/03/2021), Disp: 30 capsule, Rfl: 0   budesonide (RHINOCORT AQUA) 32 MCG/ACT nasal spray, Place 1 spray into both nostrils daily as needed for rhinitis. (Patient not taking: Reported on 01/03/2021), Disp: , Rfl:    Calcium-Magnesium-Zinc (CAL-MAG-ZINC PO), Take 2 tablets by mouth at bedtime., Disp: , Rfl:    Cholecalciferol (VITAMIN D3) 5000 units CAPS, Take 15,000 Units by mouth at bedtime., Disp: , Rfl:    Coenzyme Q10 (CO Q 10) 100 MG CAPS, Take 100 mg by mouth every evening. , Disp: , Rfl:    Digestive Enzymes  (ENZYME DIGEST) CAPS, Take 1 capsule by mouth 2 (two) times daily with a meal. , Disp: , Rfl:    fluconazole (DIFLUCAN) 150 MG tablet, Take 150 mg by mouth once., Disp: , Rfl:    guaiFENesin-dextromethorphan (ROBITUSSIN DM) 100-10 MG/5ML syrup, Take 10 mLs by mouth every 4 (four) hours as needed for cough., Disp: 118 mL, Rfl: 0   hydrocortisone (CORTEF) 5 MG tablet, Take 5 mg by mouth See admin instructions. TAKE 10MG  IN THE MORNING AND 5 MG IN THE AFTERNOON., Disp: , Rfl:    ibuprofen (ADVIL,MOTRIN) 200 MG tablet, Take 400 mg by mouth every 6 (six) hours as needed for moderate pain., Disp: , Rfl:    Magnesium 400 MG TABS, Take 400 mg by mouth at bedtime., Disp: , Rfl:    Misc Natural Products (IMMUNE FORMULA PO), Take 1 tablet by mouth 4 (four) times a week. (Patient not taking: Reported on 01/03/2021), Disp: , Rfl:    Multiple Vitamin (MULTIVITAMIN WITH MINERALS) TABS tablet, Take 3 tablets by mouth daily. , Disp: , Rfl:    Omega-3 Fatty Acids (OMEGA 3 PO), Take 2 capsules by mouth 2 (two) times daily., Disp: , Rfl:    Polyethyl Glycol-Propyl Glycol 0.4-0.3 % SOLN, Place 1 drop into both eyes 4 (four) times daily. , Disp: , Rfl:    predniSONE (DELTASONE) 10 MG tablet, prednisone 10 mg tablet, Disp: , Rfl:    Probiotic Product (PROBIOTIC PO), Take 1  capsule by mouth every evening. , Disp: , Rfl:    thyroid (ARMOUR) 90 MG tablet, Takes 35 mg and 45 mg at bedtime., Disp: , Rfl:    vitamin A 10000 UNIT capsule, Take 10,000 Units by mouth every evening. , Disp: , Rfl:   Past Medical History: Past Medical History:  Diagnosis Date   Adrenal insufficiency (HCC)    Thyroid disease     Tobacco Use: Social History   Tobacco Use  Smoking Status Never  Smokeless Tobacco Never    Labs: Recent Review Flowsheet Data     Labs for ITP Cardiac and Pulmonary Rehab Latest Ref Rng & Units 11/21/2020   PHART 7.350 - 7.450 7.486(H)   PCO2ART 32.0 - 48.0 mmHg 28.1(L)   HCO3 20.0 - 28.0 mmol/L 23.6    ACIDBASEDEF 0.0 - 2.0 mmol/L 2.0   O2SAT % 96.3       Capillary Blood Glucose: Lab Results  Component Value Date   GLUCAP 152 (H) 11/27/2020   GLUCAP 117 (H) 11/27/2020     Pulmonary Assessment Scores:  Pulmonary Assessment Scores     Row Name 02/08/21 0836         ADL UCSD   SOB Score total 8     Rest 0     Walk 2     Stairs 2     Bath 0     Dress 0     Shop 0       CAT Score   CAT Score 7       mMRC Score   mMRC Score 2             UCSD: Self-administered rating of dyspnea associated with activities of daily living (ADLs) 6-point scale (0 = "not at all" to 5 = "maximal or unable to do because of breathlessness")  Scoring Scores range from 0 to 120.  Minimally important difference is 5 units  CAT: CAT can identify the health impairment of COPD patients and is better correlated with disease progression.  CAT has a scoring range of zero to 40. The CAT score is classified into four groups of low (less than 10), medium (10 - 20), high (21-30) and very high (31-40) based on the impact level of disease on health status. A CAT score over 10 suggests significant symptoms.  A worsening CAT score could be explained by an exacerbation, poor medication adherence, poor inhaler technique, or progression of COPD or comorbid conditions.  CAT MCID is 2 points  mMRC: mMRC (Modified Medical Research Council) Dyspnea Scale is used to assess the degree of baseline functional disability in patients of respiratory disease due to dyspnea. No minimal important difference is established. A decrease in score of 1 point or greater is considered a positive change.   Pulmonary Function Assessment:   Exercise Target Goals: Exercise Program Goal: Individual exercise prescription set using results from initial 6 min walk test and THRR while considering  patient's activity barriers and safety.   Exercise Prescription Goal: Initial exercise prescription builds to 30-45 minutes a day of  aerobic activity, 2-3 days per week.  Home exercise guidelines will be given to patient during program as part of exercise prescription that the participant will acknowledge.  Activity Barriers & Risk Stratification:  Activity Barriers & Cardiac Risk Stratification - 02/08/21 0844       Activity Barriers & Cardiac Risk Stratification   Activity Barriers Shortness of Breath;Deconditioning    Cardiac Risk Stratification Low  6 Minute Walk:  6 Minute Walk     Row Name 02/08/21 1016         6 Minute Walk   Phase Initial     Distance 1000 feet     Walk Time 6 minutes     # of Rest Breaks 0     MPH 1.9     METS 2.16     RPE 12     Perceived Dyspnea  11     VO2 Peak 7.55     Symptoms No     Resting HR 84 bpm     Resting BP 124/60     Resting Oxygen Saturation  96 %     Exercise Oxygen Saturation  during 6 min walk 93 %     Max Ex. HR 119 bpm     Max Ex. BP 130/62     2 Minute Post BP 120/60       Interval HR   1 Minute HR 118     2 Minute HR 118     3 Minute HR 112     4 Minute HR 118     5 Minute HR 119     6 Minute HR 115     2 Minute Post HR 92     Interval Heart Rate? Yes       Interval Oxygen   Interval Oxygen? Yes     Baseline Oxygen Saturation % 96 %     1 Minute Oxygen Saturation % 95 %     1 Minute Liters of Oxygen 0 L     2 Minute Oxygen Saturation % 93 %     2 Minute Liters of Oxygen 0 L     3 Minute Oxygen Saturation % 94 %     3 Minute Liters of Oxygen 0 L     4 Minute Oxygen Saturation % 95 %     4 Minute Liters of Oxygen 0 L     5 Minute Oxygen Saturation % 95 %     5 Minute Liters of Oxygen 0 L     6 Minute Oxygen Saturation % 95 %     6 Minute Liters of Oxygen 0 L     2 Minute Post Oxygen Saturation % 98 %     2 Minute Post Liters of Oxygen 0 L              Oxygen Initial Assessment:  Oxygen Initial Assessment - 02/08/21 1022       Initial 6 min Walk   Oxygen Used None      Program Oxygen Prescription   Program  Oxygen Prescription None      Intervention   Short Term Goals To learn and exhibit compliance with exercise, home and travel O2 prescription;To learn and understand importance of monitoring SPO2 with pulse oximeter and demonstrate accurate use of the pulse oximeter.;To learn and understand importance of maintaining oxygen saturations>88%;To learn and demonstrate proper use of respiratory medications    Long  Term Goals Exhibits compliance with exercise, home  and travel O2 prescription;Verbalizes importance of monitoring SPO2 with pulse oximeter and return demonstration;Maintenance of O2 saturations>88%;Compliance with respiratory medication;Demonstrates proper use of MDI's             Oxygen Re-Evaluation:  Oxygen Re-Evaluation     Row Name 02/20/21 1255             Program Oxygen Prescription   Program Oxygen Prescription  None         Home Oxygen   Home Oxygen Device Home Concentrator;E-Tanks       Sleep Oxygen Prescription Continuous       Liters per minute 2       Home Exercise Oxygen Prescription None       Home Resting Oxygen Prescription None       Compliance with Home Oxygen Use Yes         Goals/Expected Outcomes   Short Term Goals To learn and exhibit compliance with exercise, home and travel O2 prescription;To learn and understand importance of monitoring SPO2 with pulse oximeter and demonstrate accurate use of the pulse oximeter.;To learn and understand importance of maintaining oxygen saturations>88%;To learn and demonstrate proper use of respiratory medications       Long  Term Goals Exhibits compliance with exercise, home  and travel O2 prescription;Verbalizes importance of monitoring SPO2 with pulse oximeter and return demonstration;Maintenance of O2 saturations>88%;Compliance with respiratory medication;Demonstrates proper use of MDI's       Goals/Expected Outcomes compliance                Oxygen Discharge (Final Oxygen Re-Evaluation):  Oxygen  Re-Evaluation - 02/20/21 1255       Program Oxygen Prescription   Program Oxygen Prescription None      Home Oxygen   Home Oxygen Device Home Concentrator;E-Tanks    Sleep Oxygen Prescription Continuous    Liters per minute 2    Home Exercise Oxygen Prescription None    Home Resting Oxygen Prescription None    Compliance with Home Oxygen Use Yes      Goals/Expected Outcomes   Short Term Goals To learn and exhibit compliance with exercise, home and travel O2 prescription;To learn and understand importance of monitoring SPO2 with pulse oximeter and demonstrate accurate use of the pulse oximeter.;To learn and understand importance of maintaining oxygen saturations>88%;To learn and demonstrate proper use of respiratory medications    Long  Term Goals Exhibits compliance with exercise, home  and travel O2 prescription;Verbalizes importance of monitoring SPO2 with pulse oximeter and return demonstration;Maintenance of O2 saturations>88%;Compliance with respiratory medication;Demonstrates proper use of MDI's    Goals/Expected Outcomes compliance             Initial Exercise Prescription:  Initial Exercise Prescription - 02/08/21 1000       Date of Initial Exercise RX and Referring Provider   Date 02/08/21    Referring Provider Dr. Vaughan Browner    Expected Discharge Date 06/14/21      Treadmill   MPH 1    Grade 0    Minutes 17      NuStep   Level 1    SPM 60    Minutes 22      Prescription Details   Frequency (times per week) 2    Duration Progress to 30 minutes of continuous aerobic without signs/symptoms of physical distress      Intensity   THRR 40-80% of Max Heartrate 60-119    Ratings of Perceived Exertion 11-15    Perceived Dyspnea 0-4      Resistance Training   Training Prescription Yes    Weight 3    Reps 10-15             Perform Capillary Blood Glucose checks as needed.  Exercise Prescription Changes:   Exercise Prescription Changes     Row Name  02/20/21 1200  Response to Exercise   Blood Pressure (Admit) 125/60       Blood Pressure (Exercise) 140/65       Blood Pressure (Exit) 120/70       Heart Rate (Admit) 75 bpm       Heart Rate (Exercise) 109 bpm       Heart Rate (Exit) 85 bpm       Oxygen Saturation (Admit) 96 %       Oxygen Saturation (Exercise) 95 %       Oxygen Saturation (Exit) 95 %       Rating of Perceived Exertion (Exercise) 12       Perceived Dyspnea (Exercise) 12       Duration Continue with 30 min of aerobic exercise without signs/symptoms of physical distress.       Intensity THRR unchanged         Progression   Progression Continue to progress workloads to maintain intensity without signs/symptoms of physical distress.         Resistance Training   Training Prescription Yes       Weight 1       Reps 10-15       Time 10 Minutes         Treadmill   MPH 1       Grade 0       Minutes 17       METs 1.76         NuStep   Level 1       SPM 61       Minutes 22       METs 1.6                Exercise Comments:   Exercise Goals and Review:   Exercise Goals     Row Name 02/08/21 1020             Exercise Goals   Increase Physical Activity Yes       Intervention Provide advice, education, support and counseling about physical activity/exercise needs.;Develop an individualized exercise prescription for aerobic and resistive training based on initial evaluation findings, risk stratification, comorbidities and participant's personal goals.       Expected Outcomes Short Term: Attend rehab on a regular basis to increase amount of physical activity.;Long Term: Add in home exercise to make exercise part of routine and to increase amount of physical activity.;Long Term: Exercising regularly at least 3-5 days a week.       Increase Strength and Stamina Yes       Intervention Provide advice, education, support and counseling about physical activity/exercise needs.;Develop an  individualized exercise prescription for aerobic and resistive training based on initial evaluation findings, risk stratification, comorbidities and participant's personal goals.       Expected Outcomes Short Term: Increase workloads from initial exercise prescription for resistance, speed, and METs.;Short Term: Perform resistance training exercises routinely during rehab and add in resistance training at home;Long Term: Improve cardiorespiratory fitness, muscular endurance and strength as measured by increased METs and functional capacity ( )       Able to understand and use rate of perceived exertion (RPE) scale Yes       Intervention Provide education and explanation on how to use RPE scale       Expected Outcomes Short Term: Able to use RPE daily in rehab to express subjective intensity level;Long Term:  Able to use RPE to guide intensity level when exercising independently  Able to understand and use Dyspnea scale Yes       Intervention Provide education and explanation on how to use Dyspnea scale       Expected Outcomes Short Term: Able to use Dyspnea scale daily in rehab to express subjective sense of shortness of breath during exertion;Long Term: Able to use Dyspnea scale to guide intensity level when exercising independently       Knowledge and understanding of Target Heart Rate Range (THRR) Yes       Intervention Provide education and explanation of THRR including how the numbers were predicted and where they are located for reference       Expected Outcomes Short Term: Able to state/look up THRR;Long Term: Able to use THRR to govern intensity when exercising independently;Short Term: Able to use daily as guideline for intensity in rehab       Understanding of Exercise Prescription Yes       Intervention Provide education, explanation, and written materials on patient's individual exercise prescription       Expected Outcomes Short Term: Able to explain program exercise  prescription;Long Term: Able to explain home exercise prescription to exercise independently                Exercise Goals Re-Evaluation :  Exercise Goals Re-Evaluation     Row Name 02/20/21 1251             Exercise Goal Re-Evaluation   Exercise Goals Review Increase Physical Activity;Increase Strength and Stamina;Able to understand and use rate of perceived exertion (RPE) scale;Able to understand and use Dyspnea scale;Knowledge and understanding of Target Heart Rate Range (THRR);Understanding of Exercise Prescription       Comments Pt has completed 3 sessions of pulmonary rehab. She is motivated to be in rehab. She is currently exercising at 1.76 METs on the TM. Will continue to montior and progress as able.       Expected Outcomes Through exercise at rehab and at home, the patient will reach their stated goals.                Discharge Exercise Prescription (Final Exercise Prescription Changes):  Exercise Prescription Changes - 02/20/21 1200       Response to Exercise   Blood Pressure (Admit) 125/60    Blood Pressure (Exercise) 140/65    Blood Pressure (Exit) 120/70    Heart Rate (Admit) 75 bpm    Heart Rate (Exercise) 109 bpm    Heart Rate (Exit) 85 bpm    Oxygen Saturation (Admit) 96 %    Oxygen Saturation (Exercise) 95 %    Oxygen Saturation (Exit) 95 %    Rating of Perceived Exertion (Exercise) 12    Perceived Dyspnea (Exercise) 12    Duration Continue with 30 min of aerobic exercise without signs/symptoms of physical distress.    Intensity THRR unchanged      Progression   Progression Continue to progress workloads to maintain intensity without signs/symptoms of physical distress.      Resistance Training   Training Prescription Yes    Weight 1    Reps 10-15    Time 10 Minutes      Treadmill   MPH 1    Grade 0    Minutes 17    METs 1.76      NuStep   Level 1    SPM 61    Minutes 22    METs 1.6  Nutrition:  Target Goals:  Understanding of nutrition guidelines, daily intake of sodium 1500mg , cholesterol 200mg , calories 30% from fat and 7% or less from saturated fats, daily to have 5 or more servings of fruits and vegetables.  Biometrics:  Pre Biometrics - 02/08/21 1021       Pre Biometrics   Height 5\' 3"  (1.6 m)    Weight 83.7 kg    Waist Circumference 43.5 inches    Hip Circumference 41 inches    Waist to Hip Ratio 1.06 %    BMI (Calculated) 32.7    Triceps Skinfold 23 mm    % Body Fat 44.1 %    Grip Strength 17.6 kg    Flexibility 6.5 in    Single Leg Stand 7.52 seconds              Nutrition Therapy Plan and Nutrition Goals:  Nutrition Therapy & Goals - 02/14/21 1321       Personal Nutrition Goals   Comments Patient scored 31 on the diet assestment,  We will provide nutritional education through handouts  and discuss chooses with a heart healthy nutrition and assistance with RD refereal if patient is interested.      Intervention Plan   Intervention Nutrition handout(s) given to patient.             Nutrition Assessments:  Nutrition Assessments - 02/08/21 0851       MEDFICTS Scores   Pre Score 31            MEDIFICTS Score Key: ?70 Need to make dietary changes  40-70 Heart Healthy Diet ? 40 Therapeutic Level Cholesterol Diet   Picture Your Plate Scores: [id number] Unhealthy dietary pattern with much room for improvement. 41-50 Dietary pattern unlikely to meet recommendations for good health and room for improvement. 51-60 More healthful dietary pattern, with some room for improvement.  >60 Healthy dietary pattern, although there may be some specific behaviors that could be improved.    Nutrition Goals Re-Evaluation:   Nutrition Goals Discharge (Final Nutrition Goals Re-Evaluation):   Psychosocial: Target Goals: Acknowledge presence or absence of significant depression and/or stress, maximize coping skills, provide positive support system. Participant is able to  verbalize types and ability to use techniques and skills needed for reducing stress and depression.  Initial Review & Psychosocial Screening:  Initial Psych Review & Screening - 02/08/21 0849       Initial Review   Current issues with None Identified      Family Dynamics   Good Support System? Yes    Comments Her husband is her main support system.      Barriers   Psychosocial barriers to participate in program There are no identifiable barriers or psychosocial needs.      Screening Interventions   Interventions Encouraged to exercise    Expected Outcomes Long Term Goal: Stressors or current issues are controlled or eliminated.;Short Term goal: Identification and review with participant of any Quality of Life or Depression concerns found by scoring the questionnaire.;Long Term goal: The participant improves quality of Life and PHQ9 Scores as seen by post scores and/or verbalization of changes             Quality of Life Scores:  Quality of Life - 02/08/21 1023       Quality of Life   Select Quality of Life      Quality of Life Scores   Health/Function Pre 21 %    Socioeconomic Pre 21 %  Psych/Spiritual Pre 21 %    Family Pre 21 %    GLOBAL Pre 21 %            Scores of 19 and below usually indicate a poorer quality of life in these areas.  A difference of  2-3 points is a clinically meaningful difference.  A difference of 2-3 points in the total score of the Quality of Life Index has been associated with significant improvement in overall quality of life, self-image, physical symptoms, and general health in studies assessing change in quality of life.   PHQ-9: Recent Review Flowsheet Data     Depression screen [facility] 2/9 02/08/2021   Decreased Interest 1   Down, Depressed, Hopeless 0   PHQ - 2 Score 1   Altered sleeping 0   Tired, decreased energy 1   Change in appetite 0   Feeling bad or failure about yourself  0   Trouble concentrating 0   Moving slowly or  fidgety/restless 0   Suicidal thoughts 0   PHQ-9 Score 2   Difficult doing work/chores Not difficult at all      Interpretation of Total Score  Total Score Depression Severity:  1-4 = Minimal depression, 5-9 = Mild depression, 10-14 = Moderate depression, 15-19 = Moderately severe depression, 20-27 = Severe depression   Psychosocial Evaluation and Intervention:  Psychosocial Evaluation - 02/08/21 0958       Psychosocial Evaluation & Interventions   Interventions Stress management education;Relaxation education;Encouraged to exercise with the program and follow exercise prescription    Comments Pt has no barriers to completing rehab. She has no identifiable psychosocial issues. She does take xanax to help her sleep. She has adrenal gland insufficiency, and the medications that she takes for this causes her to no sleep well. She has been short of breath and easily fatigued since she had COVID in August. This has also caused a lot of her hair to fall out which does cause her some stress. She reports that she copes well and has a good support system from her husband. Her goals while in the program are to decrease her shortness of breath with exertion and to lose about 12 lbs. She reports that she has never exercised before but she is willing to do whatever it takes to feel better again. She is optimistic that this program will help her reach her goals.    Expected Outcomes Pt will continue to not have any identifiable psychosocial issues.    Continue Psychosocial Services  No Follow up required             Psychosocial Re-Evaluation:  Psychosocial Re-Evaluation     [place] Name 02/14/21 1314             Psychosocial Re-Evaluation   Current issues with Current Sleep Concerns       Comments Patient is new in program.  Her initial QOL score was 21 and her PHQ-9 was 2. She seems to enjoy coming to program and interacts well with class and staff. She demonstrates a very positive outlook and  attitude. We will continue to monitor.       Expected Outcomes Patient will have no psychosocial barriers identified at discharge.       Interventions Relaxation education;Stress management education;Encouraged to attend Pulmonary Rehabilitation for the exercise       Continue Psychosocial Services  No Follow up required                Psychosocial  Discharge (Final Psychosocial Re-Evaluation):  Psychosocial Re-Evaluation - 02/14/21 1314       Psychosocial Re-Evaluation   Current issues with Current Sleep Concerns    Comments Patient is new in program.  Her initial QOL score was 21 and her PHQ-9 was 2. She seems to enjoy coming to program and interacts well with class and staff. She demonstrates a very positive outlook and attitude. We will continue to monitor.    Expected Outcomes Patient will have no psychosocial barriers identified at discharge.    Interventions Relaxation education;Stress management education;Encouraged to attend Pulmonary Rehabilitation for the exercise    Continue Psychosocial Services  No Follow up required              Education: Education Goals: Education classes will be provided on a weekly basis, covering required topics. Participant will state understanding/return demonstration of topics presented.  Learning Barriers/Preferences:  Learning Barriers/Preferences - 02/08/21 0854       Learning Barriers/Preferences   Learning Barriers None    Learning Preferences Written Material;Individual Instruction             Education Topics: How Lungs Work and Diseases: - Discuss the anatomy of the lungs and diseases that can affect the lungs, such as COPD.   Exercise: -Discuss the importance of exercise, FITT principles of exercise, normal and abnormal responses to exercise, and how to exercise safely.   Environmental Irritants: -Discuss types of environmental irritants and how to limit exposure to environmental irritants.   Meds/Inhalers and  oxygen: - Discuss respiratory medications, definition of an inhaler and oxygen, and the proper way to use an inhaler and oxygen.   Energy Saving Techniques: - Discuss methods to conserve energy and decrease shortness of breath when performing activities of daily living.    Bronchial Hygiene / Breathing Techniques: - Discuss breathing mechanics, pursed-lip breathing technique,  proper posture, effective ways to clear airways, and other functional breathing techniques Flowsheet Row PULMONARY REHAB OTHER RESPIRATORY from 02/15/2021 in [place] [facility]  Date 02/15/21  Educator Handout  Instruction Review Code 1- Personnel officer: - Provides group verbal and written instruction about the health risks of elevated stress, cause of high stress, and healthy ways to reduce stress.   Nutrition I: Fats: - Discuss the types of cholesterol, what cholesterol does to the body, and how cholesterol levels can be controlled.   Nutrition II: Labels: -Discuss the different components of food labels and how to read food labels.   Respiratory Infections: - Discuss the signs and symptoms of respiratory infections, ways to prevent respiratory infections, and the importance of seeking medical treatment when having a respiratory infection.   Stress I: Signs and Symptoms: - Discuss the causes of stress, how stress may lead to anxiety and depression, and ways to limit stress.   Stress II: Relaxation: -Discuss relaxation techniques to limit stress.   Oxygen for Home/Travel: - Discuss how to prepare for travel when on oxygen and proper ways to transport and store oxygen to ensure safety.   Knowledge Questionnaire Score:  Knowledge Questionnaire Score - 02/08/21 0855       Knowledge Questionnaire Score   Pre Score 18/18             Core Components/Risk Factors/Patient Goals at Admission:  Personal Goals and Risk Factors at Admission -  02/08/21 0858       Core Components/Risk Factors/Patient Goals on Admission    Weight Management Yes;Weight Loss  Intervention Weight Management: Develop a combined nutrition and exercise program designed to reach desired caloric intake, while maintaining appropriate intake of nutrient and fiber, sodium and fats, and appropriate energy expenditure required for the weight goal.;Weight Management: Provide education and appropriate resources to help participant work on and attain dietary goals.    Expected Outcomes Short Term: Continue to assess and modify interventions until short term weight is achieved;Long Term: Adherence to nutrition and physical activity/exercise program aimed toward attainment of established weight goal;Weight Loss: Understanding of general recommendations for a balanced deficit meal plan, which promotes 1-2 lb weight loss per week and includes a negative energy balance of [phone] kcal/d;Understanding recommendations for meals to include 15-35% energy as protein, 25-35% energy from fat, 35-60% energy from carbohydrates, less than 200mg  of dietary cholesterol, 20-35 gm of total fiber daily;Understanding of distribution of calorie intake throughout the day with the consumption of 4-5 meals/snacks    Improve shortness of breath with ADL's Yes    Intervention Provide education, individualized exercise plan and daily activity instruction to help decrease symptoms of SOB with activities of daily living.    Expected Outcomes Short Term: Improve cardiorespiratory fitness to achieve a reduction of symptoms when performing ADLs;Long Term: Be able to perform more ADLs without symptoms or delay the onset of symptoms             Core Components/Risk Factors/Patient Goals Review:   Goals and Risk Factor Review     Row Name 02/14/21 1331             Core Components/Risk Factors/Patient Goals Review   Personal Goals Review Improve shortness of breath with ADL's;Weight  Management/Obesity;Develop more efficient breathing techniques such as purse lipped breathing and diaphragmatic breathing and practicing self-pacing with activity.       Review Patien referred to PR with DOE & post COVID by Dr. Vaughan Browner.  She is new to the program completing the 1st session. Her perseonal goals for the program are to get stronger, decreased DOE , loss weight and feel better again.  We will continue to monitor her progress as she works toward meeting these goals.       Expected Outcomes Patient will complete the program meeting both program and personal goals.                Core Components/Risk Factors/Patient Goals at Discharge (Final Review):   Goals and Risk Factor Review - 02/14/21 1331       Core Components/Risk Factors/Patient Goals Review   Personal Goals Review Improve shortness of breath with ADL's;Weight Management/Obesity;Develop more efficient breathing techniques such as purse lipped breathing and diaphragmatic breathing and practicing self-pacing with activity.    Review Patien referred to PR with DOE & post COVID by Dr. Vaughan Browner.  She is new to the program completing the 1st session. Her perseonal goals for the program are to get stronger, decreased DOE , loss weight and feel better again.  We will continue to monitor her progress as she works toward meeting these goals.    Expected Outcomes Patient will complete the program meeting both program and personal goals.             ITP Comments:   Comments: ITP REVIEW Pt is making expected progress toward pulmonary rehab goals after completing 4 sessions. Recommend continued exercise, life style modification, education, and utilization of breathing techniques to increase stamina and strength and decrease shortness of breath with exertion.

## 2021-02-22 ENCOUNTER — Encounter (HOSPITAL_COMMUNITY)
Admission: RE | Admit: 2021-02-22 | Discharge: 2021-02-22 | Disposition: A | Discharge status: Home / Self Care | Payer: Medicare Other | Source: Ambulatory Visit | Attending: Pulmonary Disease | Admitting: Pulmonary Disease

## 2021-02-22 DIAGNOSIS — R0609 Other forms of dyspnea: Secondary | ICD-10-CM | POA: Diagnosis not present

## 2021-02-22 DIAGNOSIS — U099 Post covid-19 condition, unspecified: Secondary | ICD-10-CM

## 2021-02-22 NOTE — Progress Notes (Signed)
Daily Session Note  Patient Details  Name: Monica Harrison MRN: [id number] Date of Birth: Jun 04, 1950 Referring Provider:   Flowsheet Row PULMONARY REHAB OTHER RESP ORIENTATION from 02/08/2021 in [place]  Referring Provider Dr. Vaughan Browner       Encounter Date: 02/22/2021  Check In:  Session Check In - 02/22/21 1045       Check-In   Supervising physician immediately available to respond to emergencies [facility] MD immediately available    Physician(s) Dr. Harl Bowie    Location [facility]    Staff Present Redge Gainer, BS, Exercise Physiologist;Jyssica Rief Wynetta Emery, RN, BSN    Virtual Visit No    Medication changes reported     No    Fall or balance concerns reported    No    Tobacco Cessation No Change    Warm-up and Cool-down Performed as group-led instruction    Resistance Training Performed Yes    VAD Patient? No    PAD/SET Patient? No      Pain Assessment   Currently in Pain? No/denies    Multiple Pain Sites No             Capillary Blood Glucose: No results found for this or any previous visit (from the past 24 hour(s)).    Social History   Tobacco Use  Smoking Status Never  Smokeless Tobacco Never    Goals Met:  Independence with exercise equipment Using PLB without cueing & demonstrates good technique Exercise tolerated well No report of concerns or symptoms today Strength training completed today  Goals Unmet:  Not Applicable  Comments: Check out 1145.   Dr. Kathie Dike is Medical Director for [facility].

## 2021-02-27 ENCOUNTER — Encounter (HOSPITAL_COMMUNITY)
Admission: RE | Admit: 2021-02-27 | Discharge: 2021-02-27 | Disposition: A | Discharge status: Home / Self Care | Payer: Medicare Other | Source: Ambulatory Visit | Attending: Pulmonary Disease | Admitting: Pulmonary Disease

## 2021-02-27 DIAGNOSIS — R0609 Other forms of dyspnea: Secondary | ICD-10-CM | POA: Diagnosis not present

## 2021-02-27 DIAGNOSIS — U099 Post covid-19 condition, unspecified: Secondary | ICD-10-CM

## 2021-02-27 NOTE — Progress Notes (Signed)
Daily Session Note  Patient Details  Name: ELEISHA BRANSCOMB MRN: [id number] Date of Birth: Feb 01, 1951 Referring Provider:   [place] from 02/08/2021 in [place]  Referring Provider Dr. Vaughan Browner       Encounter Date: 02/27/2021  Check In:  Session Check In - 02/27/21 1045       Check-In   Supervising physician immediately available to respond to emergencies [facility] MD immediately available    Physician(s) Dr. Domenic Polite    Location [facility]    Staff Present Hoy Register, MS, ACSM-CEP, Exercise Physiologist;Heather Zigmund Daniel, Exercise Physiologist    Virtual Visit No    Medication changes reported     No    Fall or balance concerns reported    No    Tobacco Cessation No Change    Warm-up and Cool-down Performed as group-led instruction    Resistance Training Performed Yes    VAD Patient? No    PAD/SET Patient? No      Pain Assessment   Currently in Pain? No/denies    Multiple Pain Sites No             Capillary Blood Glucose: No results found for this or any previous visit (from the past 24 hour(s)).    Social History   Tobacco Use  Smoking Status Never  Smokeless Tobacco Never    Goals Met:  Independence with exercise equipment Exercise tolerated well No report of concerns or symptoms today Strength training completed today  Goals Unmet:  Not Applicable  Comments: checkout time is 1145   Dr. Kathie Dike is Medical Director for [facility].

## 2021-03-01 ENCOUNTER — Encounter (HOSPITAL_COMMUNITY)
Admission: RE | Admit: 2021-03-01 | Discharge: 2021-03-01 | Disposition: A | Discharge status: Home / Self Care | Payer: Medicare Other | Source: Ambulatory Visit | Attending: Pulmonary Disease | Admitting: Pulmonary Disease

## 2021-03-01 DIAGNOSIS — R0609 Other forms of dyspnea: Secondary | ICD-10-CM | POA: Diagnosis not present

## 2021-03-01 DIAGNOSIS — U099 Post covid-19 condition, unspecified: Secondary | ICD-10-CM

## 2021-03-01 NOTE — Progress Notes (Signed)
Daily Session Note  Patient Details  Name: Monica Harrison MRN: [id number] Date of Birth: 1951/01/15 Referring Provider:   Flowsheet Row PULMONARY REHAB OTHER RESP ORIENTATION from 02/08/2021 in [place]  Referring Provider Dr. Vaughan Browner       Encounter Date: 03/01/2021  Check In:  Session Check In - 03/01/21 1045       Check-In   Supervising physician immediately available to respond to emergencies [facility] MD immediately available    Physician(s) Dr. Gardiner Rhyme    Location [facility]    Staff Present Hoy Register, MS, ACSM-CEP, Exercise Physiologist;Heather Zigmund Daniel, Exercise Physiologist    Virtual Visit No    Medication changes reported     No    Fall or balance concerns reported    No    Tobacco Cessation No Change    Warm-up and Cool-down Performed as group-led instruction    Resistance Training Performed Yes    VAD Patient? No    PAD/SET Patient? No      Pain Assessment   Currently in Pain? No/denies    Multiple Pain Sites No             Capillary Blood Glucose: No results found for this or any previous visit (from the past 24 hour(s)).    Social History   Tobacco Use  Smoking Status Never  Smokeless Tobacco Never    Goals Met:  Independence with exercise equipment Exercise tolerated well No report of concerns or symptoms today Strength training completed today  Goals Unmet:  Not Applicable  Comments: checkout time is 1145   Dr. Kathie Dike is Medical Director for [facility].

## 2021-03-01 NOTE — Progress Notes (Signed)
I called and spoke with the pt and notified of results. She verbalized understanding. Has appt with Dr Isaiah Serge 03/02/21.

## 2021-03-02 ENCOUNTER — Encounter: Payer: Self-pay | Admitting: Pulmonary Disease

## 2021-03-02 ENCOUNTER — Ambulatory Visit (INDEPENDENT_AMBULATORY_CARE_PROVIDER_SITE_OTHER): Payer: Medicare Other | Admitting: Pulmonary Disease

## 2021-03-02 ENCOUNTER — Other Ambulatory Visit: Payer: Self-pay

## 2021-03-02 VITALS — BP 130/62 | HR 84 | Temp 98.1°F | Ht 63.0 in | Wt 184.4 lb

## 2021-03-02 DIAGNOSIS — Z8616 Personal history of COVID-19: Secondary | ICD-10-CM

## 2021-03-02 DIAGNOSIS — R0602 Shortness of breath: Secondary | ICD-10-CM | POA: Diagnosis not present

## 2021-03-02 NOTE — Progress Notes (Signed)
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 Audie Box in [place] who has retired.  She has history of recurrent bronchitis, developed MRSA, E. coli pneumonia diagnosed with bronchoscopy in June 2017.  This was treated as an outpatient Has occasional seasonal allergies and is on albuterol which she uses occasionally. Has occasional snoring, especially when she is fatigued.  She was tested with a sleep apnea around 2015 and was told that she had mild obstructive events only when she was lying supine.  Currently not on treatment with CPAP  Pets: Dog Occupation: Operates a Medical sales representative shop Exposures: No mold, hot tub, Jacuzzi.  No feather pillows or comforter Smoking history: Never smoker Travel history: No significant travel history Relevant family history: No significant family history of lung disease  Interim history: Hospitalized for COVID-19 in August of 2022.  Required lengthy treatment with IV steroids, remdesivir and baricitinib.  She required high flow nasal cannula which was weaned down and was discharged on oral prednisone taper and 2 L oxygen at night  She is enrolled in pulmonary rehab 2010 with some improvement in symptoms.  She still has some hair loss and is wearing a break Had a follow-up CT showing post-COVID changes.  Echocardiogram was also done for evaluation of enlarged PA on CT scan.  There is no evidence of pulmonary hypertension.  Outpatient Encounter Medications as of 03/02/2021  Medication Sig   albuterol (PROVENTIL) (2.5 MG/3ML) 0.083% nebulizer solution Take 3 mLs (2.5 mg total) by  nebulization every 6 (six) hours as needed for wheezing or shortness of breath.   albuterol (VENTOLIN HFA) 108 (90 Base) MCG/ACT inhaler Inhale 2 puffs into the lungs every 6 (six) hours as needed for wheezing or shortness of breath.   ALPRAZolam (XANAX) 0.25 MG tablet Take 0.25 mg by mouth at bedtime.   Ascorbic Acid (VITAMIN C) 1000 MG tablet Take 2,000 mg by mouth 2 (two) times daily.   B Complex Vitamins (B COMPLEX 50 PO) Take 1 tablet by mouth daily.    Calcium-Magnesium-Zinc (CAL-MAG-ZINC PO) Take 2 tablets by mouth at bedtime.   Cholecalciferol (VITAMIN D3) 5000 units CAPS Take 15,000 Units by mouth at bedtime.   Coenzyme Q10 (CO Q 10) 100 MG CAPS Take 100 mg by mouth every evening.    Digestive Enzymes (ENZYME DIGEST) CAPS Take 1 capsule by mouth 2 (two) times daily with a meal.    fluconazole (DIFLUCAN) 150 MG tablet Take 150 mg by mouth once.   guaiFENesin-dextromethorphan (ROBITUSSIN DM) 100-10 MG/5ML syrup Take 10 mLs by mouth every 4 (four) hours as needed for cough.   hydrocortisone (CORTEF) 5 MG tablet Take 5 mg by mouth See admin instructions. TAKE 10MG  IN THE MORNING AND 5 MG IN THE AFTERNOON.   ibuprofen (ADVIL,MOTRIN) 200 MG tablet Take 400 mg by mouth every 6 (six) hours as needed for moderate pain.   Magnesium 400 MG TABS Take 400 mg by mouth at bedtime.   Misc Natural Products (IMMUNE FORMULA PO) Take 1 tablet by mouth 4 (four) times a week.   Multiple Vitamin (MULTIVITAMIN WITH MINERALS) TABS tablet Take 3 tablets by mouth daily.  Omega-3 Fatty Acids (OMEGA 3 PO) Take 2 capsules by mouth 2 (two) times daily.   Polyethyl Glycol-Propyl Glycol 0.4-0.3 % SOLN Place 1 drop into both eyes 4 (four) times daily.    Probiotic Product (PROBIOTIC PO) Take 1 capsule by mouth every evening.    thyroid (ARMOUR) 90 MG tablet Takes 35 mg and 45 mg at bedtime.   vitamin A 58099 UNIT capsule Take 10,000 Units by mouth every evening.    benzonatate (TESSALON) 200 MG capsule Take 1 capsule  (200 mg total) by mouth 3 (three) times daily as needed for cough. (Patient not taking: Reported on 03/02/2021)   budesonide (RHINOCORT AQUA) 32 MCG/ACT nasal spray Place 1 spray into both nostrils daily as needed for rhinitis. (Patient not taking: Reported on 03/02/2021)   predniSONE (DELTASONE) 10 MG tablet prednisone 10 mg tablet (Patient not taking: Reported on 03/02/2021)   No facility-administered encounter medications on file as of 03/02/2021.    Physical Exam: Blood pressure 130/62, pulse 84, temperature 98.1 F (36.7 C), temperature source Oral, height 5\' 3"  (1.6 m), weight 184 lb 6.4 oz (83.6 kg), SpO2 98 %. Gen:      No acute distress HEENT:  EOMI, sclera anicteric Neck:     No masses; no thyromegaly Lungs:    Clear to auscultation bilaterally; normal respiratory effort CV:         Regular rate and rhythm; no murmurs Abd:      + bowel sounds; soft, non-tender; no palpable masses, no distension Ext:    No edema; adequate peripheral perfusion Skin:      Warm and dry; no rash Neuro: alert and oriented x 3 Psych: normal mood and affect   Data Reviewed: Imaging: Chest x-ray 09/03/2015- mild atelectasis at the left base.   High-resolution CT 06/21/2020-no evidence of interstitial lung disease, mild centrilobular nodularity, 20 pulmonary reduce measuring 4 mm High-res CT 02/01/2021-basilar predominant peripheral groundglass, linear densities consistent with COVID-19.  Enlarged pulmonary artery I reviewed the images personally.   PFTs: 09/26/2015 FVC 3.40 [116%], FEV1 2.64 [125%], F/F 78 Normal spirometry  Cardiac: Echocardiogram 02/16/2021 LVEF 60-65%, mild LVH, grade 1 diastolic dysfunction.  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  This is new from March 2022 and is consistent with post-COVID changes Continue pulmonary rehab Schedule PFTs  and follow-up in 3 months  Continue nocturnal supplemental oxygen for now Will check overnight oximetry in 3 months to see if she can come off oxygen  Enlarged pulmonary artery Noted on CT scan.  Echo reviewed with no evidence of pulmonary hypertension  Bronchitis She has a history of asthma, COPD on record but no objective evidence of obstruction on PFTs Occasionally needs to use albuterol inhaler, not on controller medication  Plan/Recommendations: PFTs and follow-up in 3 months Continue pulmonary rehab and exercise  April 2022 MD [facility] 03/02/2021, 11:02 AM  CC: 03/04/2021, FNP

## 2021-03-02 NOTE — Addendum Note (Signed)
Addended by: Jacquiline Doe on: 03/02/2021 11:16 AM   Modules accepted: Orders

## 2021-03-02 NOTE — Progress Notes (Deleted)
Monica Harrison    [id number]    [birth year]  Primary Care Physician:Grabowski, Baxter Hire, FNP  Referring Physician: Tacy Learn, FNP [address] [place],  [place] [address]  Chief complaint: Consult for dyspnea  HPI: 70 year old with history of allergies, bronchitis, adrenal insufficiency [treated in [place]], Lyme's disease, angioedema Previously followed by Dr. Flora Lipps in [place] who has retired.  She has history of recurrent bronchitis, developed MRSA, E. coli pneumonia diagnosed with bronchoscopy in June 2017.  This was treated as an outpatient Has occasional seasonal allergies and is on albuterol which she uses occasionally. Has occasional snoring, especially when she is fatigued.  She was tested with a sleep apnea around 2015 and was told that she had mild obstructive events only when she was lying supine.  Currently not on treatment with CPAP  Pets: Dog Occupation: Operates a [employer] Exposures: No mold, hot tub, Jacuzzi.  No feather pillows or comforter Smoking history: Never smoker Travel history: No significant travel history Relevant family history: No significant family history of lung disease  Outpatient Encounter Medications as of 03/02/2021  Medication Sig   albuterol (PROVENTIL) (2.5 MG/3ML) 0.083% nebulizer solution Take 3 mLs (2.5 mg total) by nebulization every 6 (six) hours as needed for wheezing or shortness of breath.   albuterol (VENTOLIN HFA) 108 (90 Base) MCG/ACT inhaler Inhale 2 puffs into the lungs every 6 (six) hours as needed for wheezing or shortness of breath.   ALPRAZolam (XANAX) 0.25 MG tablet Take 0.25 mg by mouth at bedtime.   Ascorbic Acid (VITAMIN C) 1000 MG tablet Take 2,000 mg by mouth 2 (two) times daily.   B Complex Vitamins (B COMPLEX 50 PO) Take 1 tablet by mouth daily.    Calcium-Magnesium-Zinc (CAL-MAG-ZINC PO) Take 2 tablets by mouth at bedtime.   Cholecalciferol (VITAMIN D3) 5000 units CAPS Take  15,000 Units by mouth at bedtime.   Coenzyme Q10 (CO Q 10) 100 MG CAPS Take 100 mg by mouth every evening.    Digestive Enzymes (ENZYME DIGEST) CAPS Take 1 capsule by mouth 2 (two) times daily with a meal.    fluconazole (DIFLUCAN) 150 MG tablet Take 150 mg by mouth once.   guaiFENesin-dextromethorphan (ROBITUSSIN DM) 100-10 MG/5ML syrup Take 10 mLs by mouth every 4 (four) hours as needed for cough.   hydrocortisone (CORTEF) 5 MG tablet Take 5 mg by mouth See admin instructions. TAKE 10MG  IN THE MORNING AND 5 MG IN THE AFTERNOON.   ibuprofen (ADVIL,MOTRIN) 200 MG tablet Take 400 mg by mouth every 6 (six) hours as needed for moderate pain.   Magnesium 400 MG TABS Take 400 mg by mouth at bedtime.   Misc Natural Products (IMMUNE FORMULA PO) Take 1 tablet by mouth 4 (four) times a week.   Multiple Vitamin (MULTIVITAMIN WITH MINERALS) TABS tablet Take 3 tablets by mouth daily.    Omega-3 Fatty Acids (OMEGA 3 PO) Take 2 capsules by mouth 2 (two) times daily.   Polyethyl Glycol-Propyl Glycol 0.4-0.3 % SOLN Place 1 drop into both eyes 4 (four) times daily.    Probiotic Product (PROBIOTIC PO) Take 1 capsule by mouth every evening.    thyroid (ARMOUR) 90 MG tablet Takes 35 mg and 45 mg at bedtime.   vitamin A UNIT capsule Take 10,000 Units by mouth every evening.    benzonatate (TESSALON) 200 MG capsule Take 1 capsule (200 mg total) by mouth 3 (three) times daily as needed  for cough. (Patient not taking: Reported on 03/02/2021)   budesonide (RHINOCORT AQUA) 32 MCG/ACT nasal spray Place 1 spray into both nostrils daily as needed for rhinitis. (Patient not taking: Reported on 03/02/2021)   predniSONE (DELTASONE) 10 MG tablet prednisone 10 mg tablet (Patient not taking: Reported on 03/02/2021)   No facility-administered encounter medications on file as of 03/02/2021.    Allergies as of 03/02/2021 - Review Complete 03/02/2021  Allergen Reaction Noted   Avelox [moxifloxacin hcl in nacl] Anaphylaxis  09/03/2015   Biaxin [clarithromycin] Anaphylaxis 09/03/2015   Lactose intolerance (gi) Other (See Comments) 06/24/2018   Tobramycin Other (See Comments) 06/24/2018   Yeast-related products Swelling 06/24/2018    Past Medical History:  Diagnosis Date   Adrenal insufficiency ([place])    Thyroid disease     No past surgical history on file.  No family history on file.  Social History   Socioeconomic History   Marital status: Married    Spouse name: Not on file   Number of children: Not on file   Years of education: Not on file   Highest education level: Not on file  Occupational History   Not on file  Tobacco Use   Smoking status: Never   Smokeless tobacco: Never  Substance and Sexual Activity   Alcohol use: No   Drug use: No   Sexual activity: Not on file  Other Topics Concern   Not on file  Social History Narrative   Not on file   Social Determinants of Health   Financial Resource Strain: Not on file  Food Insecurity: Not on file  Transportation Needs: Not on file  Physical Activity: Not on file  Stress: Not on file  Social Connections: Not on file  Intimate Partner Violence: Not on file    Review of systems: Review of Systems  Constitutional: Negative for fever and chills.  HENT: Negative.   Eyes: Negative for blurred vision.  Respiratory: as per HPI  Cardiovascular: Negative for chest pain and palpitations.  Gastrointestinal: Negative for vomiting, diarrhea, blood per rectum. Genitourinary: Negative for dysuria, urgency, frequency and hematuria.  Musculoskeletal: Negative for myalgias, back pain and joint pain.  Skin: Negative for itching and rash.  Neurological: Negative for dizziness, tremors, focal weakness, seizures and loss of consciousness.  Endo/Heme/Allergies: Negative for environmental allergies.  Psychiatric/Behavioral: Negative for depression, suicidal ideas and hallucinations.  All other systems reviewed and are negative.  Physical  Exam: Blood pressure 134/76, pulse 91, temperature (!) 97.3 F (36.3 C), temperature source Skin, height 5\' 3"  (1.6 m), weight 184 lb 6.4 oz (83.6 kg), SpO2 99 %. Gen:      No acute distress HEENT:  EOMI, sclera anicteric Neck:     No masses; no thyromegaly Lungs:    Clear to auscultation bilaterally; normal respiratory effort CV:         Regular rate and rhythm; no murmurs Abd:      + bowel sounds; soft, non-tender; no palpable masses, no distension Ext:    No edema; adequate peripheral perfusion Skin:      Warm and dry; no rash Neuro: alert and oriented x 3 Psych: normal mood and affect  Data Reviewed: Imaging: Chest x-ray 09/03/2015- mild atelectasis at the left base.  I have reviewed the images personally.  PFTs: 09/26/2015 FVC 3.40 [116%], FEV1 2.64 [125%], F/F 78 Normal spirometry  Labs:  Assessment:  Bronchitis She has a history of asthma, COPD on record but no objective evidence of obstruction on PFTs Occasionally  needs to use albuterol inhaler, not on controller medication She is doing okay Chest x-ray today, PFTs for baseline assessment  Suspected sleep apnea Has snoring and daytime somnolence and fatigue. Discussed home sleep study but she like to hold off for now as she is planning on getting some dental procedures done Reassess in 3 months.  Plan/Recommendations: Chest x-ray, PFTs  Marshell Garfinkel MD [facility] 03/02/2021, 10:50 AM  CC: Beola Cord, FNP

## 2021-03-06 ENCOUNTER — Encounter (HOSPITAL_COMMUNITY): Payer: Medicare Other

## 2021-03-08 ENCOUNTER — Encounter (HOSPITAL_COMMUNITY): Payer: Medicare Other

## 2021-03-13 ENCOUNTER — Encounter (HOSPITAL_COMMUNITY)
Admission: RE | Admit: 2021-03-13 | Discharge: 2021-03-13 | Disposition: A | Discharge status: Home / Self Care | Payer: Medicare Other | Source: Ambulatory Visit | Attending: Pulmonary Disease | Admitting: Pulmonary Disease

## 2021-03-13 DIAGNOSIS — U099 Post covid-19 condition, unspecified: Secondary | ICD-10-CM

## 2021-03-13 DIAGNOSIS — R0609 Other forms of dyspnea: Secondary | ICD-10-CM

## 2021-03-13 NOTE — Progress Notes (Signed)
Daily Session Note  Patient Details  Name: Monica Harrison MRN: [id number] Date of Birth: [birth year] Referring Provider:   Flowsheet Row PULMONARY REHAB OTHER RESP ORIENTATION from 02/08/2021 in [place]  Referring Provider Dr. Vaughan Browner       Encounter Date: 03/13/2021  Check In:  Session Check In - 03/13/21 1045       Check-In   Supervising physician immediately available to respond to emergencies [facility] MD immediately available    Physician(s) Dr. Johney Frame    Location [facility]    Staff Present Geanie Cooley, RN;Heather Otho Ket, BS, Exercise Physiologist;Dalton Kris Mouton, MS, ACSM-CEP, Exercise Physiologist    Virtual Visit No    Medication changes reported     No    Fall or balance concerns reported    No    Tobacco Cessation No Change    Warm-up and Cool-down Performed as group-led instruction    Resistance Training Performed Yes    VAD Patient? No    PAD/SET Patient? No      Pain Assessment   Currently in Pain? No/denies    Multiple Pain Sites No             Capillary Blood Glucose: No results found for this or any previous visit (from the past 24 hour(s)).    Social History   Tobacco Use  Smoking Status Never  Smokeless Tobacco Never    Goals Met:  Proper associated with RPD/PD & O2 Sat Independence with exercise equipment Exercise tolerated well No report of concerns or symptoms today Strength training completed today  Goals Unmet:  Not Applicable  Comments: check out @ 11:45am   Dr. Kathie Dike is Medical Director for [facility].

## 2021-03-15 ENCOUNTER — Encounter (HOSPITAL_COMMUNITY)
Admission: RE | Admit: 2021-03-15 | Discharge: 2021-03-15 | Disposition: A | Discharge status: Home / Self Care | Payer: Medicare Other | Source: Ambulatory Visit | Attending: Pulmonary Disease | Admitting: Pulmonary Disease

## 2021-03-15 DIAGNOSIS — R0609 Other forms of dyspnea: Secondary | ICD-10-CM | POA: Insufficient documentation

## 2021-03-15 DIAGNOSIS — U099 Post covid-19 condition, unspecified: Secondary | ICD-10-CM | POA: Insufficient documentation

## 2021-03-15 HISTORY — PX: OTHER SURGICAL HISTORY: SHX169

## 2021-03-15 NOTE — Progress Notes (Signed)
Daily Session Note  Patient Details  Name: Monica Harrison MRN: [id number] Date of Birth: 02/13/1951 Referring Provider:   Flowsheet Row PULMONARY REHAB OTHER RESP ORIENTATION from 02/08/2021 in [place]  Referring Provider Dr. Vaughan Browner       Encounter Date: 03/15/2021  Check In:  Session Check In - 03/15/21 1045       Check-In   Supervising physician immediately available to respond to emergencies [facility] MD immediately available    Physician(s) Dr. Domenic Polite    Location [facility]    Staff Present Hoy Register, MS, ACSM-CEP, Exercise Physiologist;Heather Zigmund Daniel, Exercise Physiologist    Virtual Visit No    Medication changes reported     No    Fall or balance concerns reported    No    Tobacco Cessation No Change    Warm-up and Cool-down Performed as group-led instruction    Resistance Training Performed Yes    VAD Patient? No    PAD/SET Patient? No      Pain Assessment   Currently in Pain? No/denies    Multiple Pain Sites No             Capillary Blood Glucose: No results found for this or any previous visit (from the past 24 hour(s)).    Social History   Tobacco Use  Smoking Status Never  Smokeless Tobacco Never    Goals Met:  Independence with exercise equipment Exercise tolerated well No report of concerns or symptoms today Strength training completed today  Goals Unmet:  Not Applicable  Comments: checkout time is 1145   Dr. Kathie Dike is Medical Director for [facility] Pul[facility].

## 2021-03-20 ENCOUNTER — Encounter (HOSPITAL_COMMUNITY)
Admission: RE | Admit: 2021-03-20 | Discharge: 2021-03-20 | Disposition: A | Discharge status: Home / Self Care | Payer: Medicare Other | Source: Ambulatory Visit | Attending: Pulmonary Disease | Admitting: Pulmonary Disease

## 2021-03-20 VITALS — Wt 183.2 lb

## 2021-03-20 DIAGNOSIS — R0609 Other forms of dyspnea: Secondary | ICD-10-CM

## 2021-03-20 DIAGNOSIS — U099 Post covid-19 condition, unspecified: Secondary | ICD-10-CM

## 2021-03-20 NOTE — Progress Notes (Signed)
Daily Session Note  Patient Details  Name: Monica Harrison MRN: [id number] Date of Birth: 10/25/1950 Referring Provider:   Flowsheet Row PULMONARY REHAB OTHER RESP ORIENTATION from 02/08/2021 in [place] [facility]  Referring Provider Dr. Mannam       Encounter Date: 03/20/2021  Check In:  Session Check In - 03/20/21 1045       Check-In   Supervising physician immediately available to respond to emergencies [facility] MD immediately available    Physician(s) Dr. Branch    Location [facility]    Staff Present Dalton Fletcher, MS, ACSM-CEP, Exercise Physiologist;Heather Jachimiak, BS, Exercise Physiologist    Virtual Visit No    Medication changes reported     No    Fall or balance concerns reported    No    Tobacco Cessation No Change    Warm-up and Cool-down Performed as group-led instruction    Resistance Training Performed Yes    VAD Patient? No    PAD/SET Patient? No      Pain Assessment   Currently in Pain? No/denies    Multiple Pain Sites No             Capillary Blood Glucose: No results found for this or any previous visit (from the past 24 hour(s)).    Social History   Tobacco Use  Smoking Status Never  Smokeless Tobacco Never    Goals Met:  Independence with exercise equipment Exercise tolerated well No report of concerns or symptoms today Strength training completed today  Goals Unmet:  Not Applicable  Comments: checkout time is 1145   Dr. Jehanzeb Memon is Medical Director for  Pulmonary Rehab. 

## 2021-03-21 NOTE — Progress Notes (Signed)
Pulmonary Individual Treatment Plan  Patient Details  Name: Monica Harrison MRN: [id number] Date of Birth: 07-16-1950 Referring Provider:   Flowsheet Row PULMONARY REHAB OTHER RESP ORIENTATION from 02/08/2021 in [facility] CARDIAC REHABILITATION  Referring Provider Dr. Isaiah Serge       Initial Encounter Date:  Flowsheet Row PULMONARY REHAB OTHER RESP ORIENTATION from 02/08/2021 in [facility] CARDIAC REHABILITATION  Date 02/08/21       Visit Diagnosis: DOE (dyspnea on exertion)  Post covid-19 condition, unspecified  Patient's Home Medications on Admission:   Current Outpatient Medications:    albuterol (PROVENTIL) (2.5 MG/3ML) 0.083% nebulizer solution, Take 3 mLs (2.5 mg total) by nebulization every 6 (six) hours as needed for wheezing or shortness of breath., Disp: 140 mL, Rfl: 3   albuterol (VENTOLIN HFA) 108 (90 Base) MCG/ACT inhaler, Inhale 2 puffs into the lungs every 6 (six) hours as needed for wheezing or shortness of breath., Disp: 8 g, Rfl: 5   ALPRAZolam (XANAX) 0.25 MG tablet, Take 0.25 mg by mouth at bedtime., Disp: , Rfl:    Ascorbic Acid (VITAMIN C) 1000 MG tablet, Take 2,000 mg by mouth 2 (two) times daily., Disp: , Rfl:    B Complex Vitamins (B COMPLEX 50 PO), Take 1 tablet by mouth daily. , Disp: , Rfl:    benzonatate (TESSALON) 200 MG capsule, Take 1 capsule (200 mg total) by mouth 3 (three) times daily as needed for cough. (Patient not taking: Reported on 03/02/2021), Disp: 30 capsule, Rfl: 0   budesonide (RHINOCORT AQUA) 32 MCG/ACT nasal spray, Place 1 spray into both nostrils daily as needed for rhinitis. (Patient not taking: Reported on 03/02/2021), Disp: , Rfl:    Calcium-Magnesium-Zinc (CAL-MAG-ZINC PO), Take 2 tablets by mouth at bedtime., Disp: , Rfl:    Cholecalciferol (VITAMIN D3) 5000 units CAPS, Take 15,000 Units by mouth at bedtime., Disp: , Rfl:    Coenzyme Q10 (CO Q 10) 100 MG CAPS, Take 100 mg by mouth every evening. , Disp: , Rfl:    Digestive Enzymes  (ENZYME DIGEST) CAPS, Take 1 capsule by mouth 2 (two) times daily with a meal. , Disp: , Rfl:    fluconazole (DIFLUCAN) 150 MG tablet, Take 150 mg by mouth once., Disp: , Rfl:    guaiFENesin-dextromethorphan (ROBITUSSIN DM) 100-10 MG/5ML syrup, Take 10 mLs by mouth every 4 (four) hours as needed for cough., Disp: 118 mL, Rfl: 0   hydrocortisone (CORTEF) 5 MG tablet, Take 5 mg by mouth See admin instructions. TAKE 10MG  IN THE MORNING AND 5 MG IN THE AFTERNOON., Disp: , Rfl:    ibuprofen (ADVIL,MOTRIN) 200 MG tablet, Take 400 mg by mouth every 6 (six) hours as needed for moderate pain., Disp: , Rfl:    Magnesium 400 MG TABS, Take 400 mg by mouth at bedtime., Disp: , Rfl:    Misc Natural Products (IMMUNE FORMULA PO), Take 1 tablet by mouth 4 (four) times a week., Disp: , Rfl:    Multiple Vitamin (MULTIVITAMIN WITH MINERALS) TABS tablet, Take 3 tablets by mouth daily. , Disp: , Rfl:    Omega-3 Fatty Acids (OMEGA 3 PO), Take 2 capsules by mouth 2 (two) times daily., Disp: , Rfl:    Polyethyl Glycol-Propyl Glycol 0.4-0.3 % SOLN, Place 1 drop into both eyes 4 (four) times daily. , Disp: , Rfl:    predniSONE (DELTASONE) 10 MG tablet, prednisone 10 mg tablet (Patient not taking: Reported on 03/02/2021), Disp: , Rfl:    Probiotic Product (PROBIOTIC PO), Take 1  capsule by mouth every evening. , Disp: , Rfl:    thyroid (ARMOUR) 90 MG tablet, Takes 35 mg and 45 mg at bedtime., Disp: , Rfl:    vitamin A 10000 UNIT capsule, Take 10,000 Units by mouth every evening. , Disp: , Rfl:   Past Medical History: Past Medical History:  Diagnosis Date   Adrenal insufficiency (HCC)    Thyroid disease     Tobacco Use: Social History   Tobacco Use  Smoking Status Never  Smokeless Tobacco Never    Labs: Recent Review Flowsheet Data     Labs for ITP Cardiac and Pulmonary Rehab Latest Ref Rng & Units 11/21/2020   PHART 7.350 - 7.450 7.486(H)   PCO2ART 32.0 - 48.0 mmHg 28.1(L)   HCO3 20.0 - 28.0 mmol/L 23.6    ACIDBASEDEF 0.0 - 2.0 mmol/L 2.0   O2SAT % 96.3       Capillary Blood Glucose: Lab Results  Component Value Date   GLUCAP 152 (H) 11/27/2020   GLUCAP 117 (H) 11/27/2020     Pulmonary Assessment Scores:  Pulmonary Assessment Scores     Row Name 02/08/21 0836         ADL UCSD   SOB Score total 8     Rest 0     Walk 2     Stairs 2     Bath 0     Dress 0     Shop 0       CAT Score   CAT Score 7       mMRC Score   mMRC Score 2             UCSD: Self-administered rating of dyspnea associated with activities of daily living (ADLs) 6-point scale (0 = "not at all" to 5 = "maximal or unable to do because of breathlessness")  Scoring Scores range from 0 to 120.  Minimally important difference is 5 units  CAT: CAT can identify the health impairment of COPD patients and is better correlated with disease progression.  CAT has a scoring range of zero to 40. The CAT score is classified into four groups of low (less than 10), medium (10 - 20), high (21-30) and very high (31-40) based on the impact level of disease on health status. A CAT score over 10 suggests significant symptoms.  A worsening CAT score could be explained by an exacerbation, poor medication adherence, poor inhaler technique, or progression of COPD or comorbid conditions.  CAT MCID is 2 points  mMRC: mMRC (Modified Medical Research Council) Dyspnea Scale is used to assess the degree of baseline functional disability in patients of respiratory disease due to dyspnea. No minimal important difference is established. A decrease in score of 1 point or greater is considered a positive change.   Pulmonary Function Assessment:   Exercise Target Goals: Exercise Program Goal: Individual exercise prescription set using results from initial 6 min walk test and THRR while considering  patient's activity barriers and safety.   Exercise Prescription Goal: Initial exercise prescription builds to 30-45 minutes a day of  aerobic activity, 2-3 days per week.  Home exercise guidelines will be given to patient during program as part of exercise prescription that the participant will acknowledge.  Activity Barriers & Risk Stratification:  Activity Barriers & Cardiac Risk Stratification - 02/08/21 0844       Activity Barriers & Cardiac Risk Stratification   Activity Barriers Shortness of Breath;Deconditioning    Cardiac Risk Stratification Low  6 Minute Walk:  6 Minute Walk     Row Name 02/08/21 1016         6 Minute Walk   Phase Initial     Distance 1000 feet     Walk Time 6 minutes     # of Rest Breaks 0     MPH 1.9     METS 2.16     RPE 12     Perceived Dyspnea  11     VO2 Peak 7.55     Symptoms No     Resting HR 84 bpm     Resting BP 124/60     Resting Oxygen Saturation  96 %     Exercise Oxygen Saturation  during 6 min walk 93 %     Max Ex. HR 119 bpm     Max Ex. BP 130/62     2 Minute Post BP 120/60       Interval HR   1 Minute HR 118     2 Minute HR 118     3 Minute HR 112     4 Minute HR 118     5 Minute HR 119     6 Minute HR 115     2 Minute Post HR 92     Interval Heart Rate? Yes       Interval Oxygen   Interval Oxygen? Yes     Baseline Oxygen Saturation % 96 %     1 Minute Oxygen Saturation % 95 %     1 Minute Liters of Oxygen 0 L     2 Minute Oxygen Saturation % 93 %     2 Minute Liters of Oxygen 0 L     3 Minute Oxygen Saturation % 94 %     3 Minute Liters of Oxygen 0 L     4 Minute Oxygen Saturation % 95 %     4 Minute Liters of Oxygen 0 L     5 Minute Oxygen Saturation % 95 %     5 Minute Liters of Oxygen 0 L     6 Minute Oxygen Saturation % 95 %     6 Minute Liters of Oxygen 0 L     2 Minute Post Oxygen Saturation % 98 %     2 Minute Post Liters of Oxygen 0 L              Oxygen Initial Assessment:  Oxygen Initial Assessment - 02/08/21 1022       Initial 6 min Walk   Oxygen Used None      Program Oxygen Prescription   Program  Oxygen Prescription None      Intervention   Short Term Goals To learn and exhibit compliance with exercise, home and travel O2 prescription;To learn and understand importance of monitoring SPO2 with pulse oximeter and demonstrate accurate use of the pulse oximeter.;To learn and understand importance of maintaining oxygen saturations>88%;To learn and demonstrate proper use of respiratory medications    Long  Term Goals Exhibits compliance with exercise, home  and travel O2 prescription;Verbalizes importance of monitoring SPO2 with pulse oximeter and return demonstration;Maintenance of O2 saturations>88%;Compliance with respiratory medication;Demonstrates proper use of MDI's             Oxygen Re-Evaluation:  Oxygen Re-Evaluation     Row Name 02/20/21 1255 03/20/21 1306           Program Oxygen Prescription   Program Oxygen Prescription  None None        Home Oxygen   Home Oxygen Device Home Concentrator;E-Tanks Home Concentrator;E-Tanks      Sleep Oxygen Prescription Continuous Continuous      Liters per minute 2 2      Home Exercise Oxygen Prescription None None      Home Resting Oxygen Prescription None None      Compliance with Home Oxygen Use Yes Yes        Goals/Expected Outcomes   Short Term Goals To learn and exhibit compliance with exercise, home and travel O2 prescription;To learn and understand importance of monitoring SPO2 with pulse oximeter and demonstrate accurate use of the pulse oximeter.;To learn and understand importance of maintaining oxygen saturations>88%;To learn and demonstrate proper use of respiratory medications To learn and exhibit compliance with exercise, home and travel O2 prescription;To learn and understand importance of monitoring SPO2 with pulse oximeter and demonstrate accurate use of the pulse oximeter.;To learn and understand importance of maintaining oxygen saturations>88%;To learn and demonstrate proper use of respiratory medications      Long   Term Goals Exhibits compliance with exercise, home  and travel O2 prescription;Verbalizes importance of monitoring SPO2 with pulse oximeter and return demonstration;Maintenance of O2 saturations>88%;Compliance with respiratory medication;Demonstrates proper use of MDI's Exhibits compliance with exercise, home  and travel O2 prescription;Verbalizes importance of monitoring SPO2 with pulse oximeter and return demonstration;Maintenance of O2 saturations>88%;Compliance with respiratory medication;Demonstrates proper use of MDI's      Goals/Expected Outcomes compliance compliance               Oxygen Discharge (Final Oxygen Re-Evaluation):  Oxygen Re-Evaluation - 03/20/21 1306       Program Oxygen Prescription   Program Oxygen Prescription None      Home Oxygen   Home Oxygen Device Home Concentrator;E-Tanks    Sleep Oxygen Prescription Continuous    Liters per minute 2    Home Exercise Oxygen Prescription None    Home Resting Oxygen Prescription None    Compliance with Home Oxygen Use Yes      Goals/Expected Outcomes   Short Term Goals To learn and exhibit compliance with exercise, home and travel O2 prescription;To learn and understand importance of monitoring SPO2 with pulse oximeter and demonstrate accurate use of the pulse oximeter.;To learn and understand importance of maintaining oxygen saturations>88%;To learn and demonstrate proper use of respiratory medications    Long  Term Goals Exhibits compliance with exercise, home  and travel O2 prescription;Verbalizes importance of monitoring SPO2 with pulse oximeter and return demonstration;Maintenance of O2 saturations>88%;Compliance with respiratory medication;Demonstrates proper use of MDI's    Goals/Expected Outcomes compliance             Initial Exercise Prescription:  Initial Exercise Prescription - 02/08/21 1000       Date of Initial Exercise RX and Referring Provider   Date 02/08/21    Referring Provider Dr. Vaughan Browner     Expected Discharge Date 06/14/21      Treadmill   MPH 1    Grade 0    Minutes 17      NuStep   Level 1    SPM 60    Minutes 22      Prescription Details   Frequency (times per week) 2    Duration Progress to 30 minutes of continuous aerobic without signs/symptoms of physical distress      Intensity   THRR 40-80% of Max Heartrate 60-119    Ratings of Perceived Exertion 11-15  Perceived Dyspnea 0-4      Resistance Training   Training Prescription Yes    Weight 3    Reps 10-15             Perform Capillary Blood Glucose checks as needed.  Exercise Prescription Changes:   Exercise Prescription Changes     Row Name 02/20/21 1200 03/01/21 1246 03/20/21 1200         Response to Exercise   Blood Pressure (Admit) 125/60 116/68 102/60     Blood Pressure (Exercise) 140/65 132/66 170/80     Blood Pressure (Exit) 120/70 125/60 108/64     Heart Rate (Admit) 75 bpm 88 bpm 95 bpm     Heart Rate (Exercise) 109 bpm 107 bpm 111 bpm     Heart Rate (Exit) 85 bpm 95 bpm 96 bpm     Oxygen Saturation (Admit) 96 % 96 % 96 %     Oxygen Saturation (Exercise) 95 % 94 % 95 %     Oxygen Saturation (Exit) 95 % 95 % 96 %     Rating of Perceived Exertion (Exercise) 12 12 12      Perceived Dyspnea (Exercise) 12 12 11      Duration Continue with 30 min of aerobic exercise without signs/symptoms of physical distress. Continue with 30 min of aerobic exercise without signs/symptoms of physical distress. Continue with 30 min of aerobic exercise without signs/symptoms of physical distress.     Intensity THRR unchanged THRR unchanged THRR unchanged       Progression   Progression Continue to progress workloads to maintain intensity without signs/symptoms of physical distress. Continue to progress workloads to maintain intensity without signs/symptoms of physical distress. Continue to progress workloads to maintain intensity without signs/symptoms of physical distress.       Resistance Training    Training Prescription Yes Yes Yes     Weight 1 1 1      Reps 10-15 10-15 10-15     Time 10 Minutes 10 Minutes 10 Minutes       Treadmill   MPH 1 1 1      Grade 0 0 0     Minutes 17 17 17      METs 1.76 1.76 1.76       NuStep   Level 1 1 1      SPM 61 63 56     Minutes 22 22 22      METs 1.6 1.6 1.7              Exercise Comments:   Exercise Goals and Review:   Exercise Goals     Row Name 02/08/21 1020 03/20/21 1301           Exercise Goals   Increase Physical Activity Yes Yes      Intervention Provide advice, education, support and counseling about physical activity/exercise needs.;Develop an individualized exercise prescription for aerobic and resistive training based on initial evaluation findings, risk stratification, comorbidities and participant's personal goals. Provide advice, education, support and counseling about physical activity/exercise needs.;Develop an individualized exercise prescription for aerobic and resistive training based on initial evaluation findings, risk stratification, comorbidities and participant's personal goals.      Expected Outcomes Short Term: Attend rehab on a regular basis to increase amount of physical activity.;Long Term: Add in home exercise to make exercise part of routine and to increase amount of physical activity.;Long Term: Exercising regularly at least 3-5 days a week. Short Term: Attend rehab on a regular basis to increase amount  of physical activity.;Long Term: Add in home exercise to make exercise part of routine and to increase amount of physical activity.;Long Term: Exercising regularly at least 3-5 days a week.      Increase Strength and Stamina Yes Yes      Intervention Provide advice, education, support and counseling about physical activity/exercise needs.;Develop an individualized exercise prescription for aerobic and resistive training based on initial evaluation findings, risk stratification, comorbidities and participant's  personal goals. Provide advice, education, support and counseling about physical activity/exercise needs.;Develop an individualized exercise prescription for aerobic and resistive training based on initial evaluation findings, risk stratification, comorbidities and participant's personal goals.      Expected Outcomes Short Term: Increase workloads from initial exercise prescription for resistance, speed, and METs.;Short Term: Perform resistance training exercises routinely during rehab and add in resistance training at home;Long Term: Improve cardiorespiratory fitness, muscular endurance and strength as measured by increased METs and functional capacity (6MWT) Short Term: Increase workloads from initial exercise prescription for resistance, speed, and METs.;Short Term: Perform resistance training exercises routinely during rehab and add in resistance training at home;Long Term: Improve cardiorespiratory fitness, muscular endurance and strength as measured by increased METs and functional capacity (6MWT)      Able to understand and use rate of perceived exertion (RPE) scale Yes Yes      Intervention Provide education and explanation on how to use RPE scale Provide education and explanation on how to use RPE scale      Expected Outcomes Short Term: Able to use RPE daily in rehab to express subjective intensity level;Long Term:  Able to use RPE to guide intensity level when exercising independently Short Term: Able to use RPE daily in rehab to express subjective intensity level;Long Term:  Able to use RPE to guide intensity level when exercising independently      Able to understand and use Dyspnea scale Yes Yes      Intervention Provide education and explanation on how to use Dyspnea scale Provide education and explanation on how to use Dyspnea scale      Expected Outcomes Short Term: Able to use Dyspnea scale daily in rehab to express subjective sense of shortness of breath during exertion;Long Term: Able to  use Dyspnea scale to guide intensity level when exercising independently Short Term: Able to use Dyspnea scale daily in rehab to express subjective sense of shortness of breath during exertion;Long Term: Able to use Dyspnea scale to guide intensity level when exercising independently      Knowledge and understanding of Target Heart Rate Range (THRR) Yes Yes      Intervention Provide education and explanation of THRR including how the numbers were predicted and where they are located for reference Provide education and explanation of THRR including how the numbers were predicted and where they are located for reference      Expected Outcomes Short Term: Able to state/look up THRR;Long Term: Able to use THRR to govern intensity when exercising independently;Short Term: Able to use daily as guideline for intensity in rehab Short Term: Able to state/look up THRR;Long Term: Able to use THRR to govern intensity when exercising independently;Short Term: Able to use daily as guideline for intensity in rehab      Understanding of Exercise Prescription Yes Yes      Intervention Provide education, explanation, and written materials on patient's individual exercise prescription Provide education, explanation, and written materials on patient's individual exercise prescription      Expected Outcomes Short Term: Able  to explain program exercise prescription;Long Term: Able to explain home exercise prescription to exercise independently Short Term: Able to explain program exercise prescription;Long Term: Able to explain home exercise prescription to exercise independently               Exercise Goals Re-Evaluation :  Exercise Goals Re-Evaluation     Row Name 02/20/21 1251 03/20/21 1301           Exercise Goal Re-Evaluation   Exercise Goals Review Increase Physical Activity;Increase Strength and Stamina;Able to understand and use rate of perceived exertion (RPE) scale;Able to understand and use Dyspnea  scale;Knowledge and understanding of Target Heart Rate Range (THRR);Understanding of Exercise Prescription Increase Physical Activity;Increase Strength and Stamina;Able to understand and use rate of perceived exertion (RPE) scale;Able to understand and use Dyspnea scale;Knowledge and understanding of Target Heart Rate Range (THRR);Understanding of Exercise Prescription      Comments Pt has completed 3 sessions of pulmonary rehab. She is motivated to be in rehab. She is currently exercising at 1.76 METs on the TM. Will continue to montior and progress as able. Pt has completed 9 sessions of pulmonary rehab. She is motivated when she comes into class. Pt could push herself more during class to increase workload and weight but does not want to due to her work involving making arringments from [employer], she does not want to be sore when she goes home. She is currently exercising at 1.7 METs on the stepper. Will continue to monitor and progress as able.      Expected Outcomes Through exercise at rehab and at home, the patient will reach their stated goals. Through exercise at rehab and at home, the patient will reach their stated goals.               Discharge Exercise Prescription (Final Exercise Prescription Changes):  Exercise Prescription Changes - 03/20/21 1200       Response to Exercise   Blood Pressure (Admit) 102/60    Blood Pressure (Exercise) 170/80    Blood Pressure (Exit) 108/64    Heart Rate (Admit) 95 bpm    Heart Rate (Exercise) 111 bpm    Heart Rate (Exit) 96 bpm    Oxygen Saturation (Admit) 96 %    Oxygen Saturation (Exercise) 95 %    Oxygen Saturation (Exit) 96 %    Rating of Perceived Exertion (Exercise) 12    Perceived Dyspnea (Exercise) 11    Duration Continue with 30 min of aerobic exercise without signs/symptoms of physical distress.    Intensity THRR unchanged      Progression   Progression Continue to progress workloads to maintain intensity without  signs/symptoms of physical distress.      Resistance Training   Training Prescription Yes    Weight 1    Reps 10-15    Time 10 Minutes      Treadmill   MPH 1    Grade 0    Minutes 17    METs 1.76      NuStep   Level 1    SPM 56    Minutes 22    METs 1.7             Nutrition:  Target Goals: Understanding of nutrition guidelines, daily intake of sodium 1500mg , cholesterol 200mg , calories 30% from fat and 7% or less from saturated fats, daily to have 5 or more servings of fruits and vegetables.  Biometrics:  Pre Biometrics - 02/08/21 1021  Pre Biometrics   Height 5\' 3"  (1.6 m)    Weight 83.7 kg    Waist Circumference 43.5 inches    Hip Circumference 41 inches    Waist to Hip Ratio 1.06 %    BMI (Calculated) 32.7    Triceps Skinfold 23 mm    % Body Fat 44.1 %    Grip Strength 17.6 kg    Flexibility 6.5 in    Single Leg Stand 7.52 seconds              Nutrition Therapy Plan and Nutrition Goals:  Nutrition Therapy & Goals - 03/15/21 1327       Personal Nutrition Goals   Comments We will provide nutritional education through handouts  and discuss chooses with a heart healthy nutrition and assistance with RD refereal if patient is interested. Will continue to monitor weight and encourage weight loss. Last weight was 83.3kg      Intervention Plan   Intervention Nutrition handout(s) given to patient.    Expected Outcomes Short Term Goal: Understand basic principles of dietary content, such as calories, fat, sodium, cholesterol and nutrients.             Nutrition Assessments:  Nutrition Assessments - 02/08/21 0851       MEDFICTS Scores   Pre Score 31            MEDIFICTS Score Key: ?70 Need to make dietary changes  40-70 Heart Healthy Diet ? 40 Therapeutic Level Cholesterol Diet   Picture Your Plate Scores: [id number] Unhealthy dietary pattern with much room for improvement. 41-50 Dietary pattern unlikely to meet recommendations for  good health and room for improvement. 51-60 More healthful dietary pattern, with some room for improvement.  >60 Healthy dietary pattern, although there may be some specific behaviors that could be improved.    Nutrition Goals Re-Evaluation:   Nutrition Goals Discharge (Final Nutrition Goals Re-Evaluation):   Psychosocial: Target Goals: Acknowledge presence or absence of significant depression and/or stress, maximize coping skills, provide positive support system. Participant is able to verbalize types and ability to use techniques and skills needed for reducing stress and depression.  Initial Review & Psychosocial Screening:  Initial Psych Review & Screening - 02/08/21 0849       Initial Review   Current issues with None Identified      Family Dynamics   Good Support System? Yes    Comments Her husband is her main support system.      Barriers   Psychosocial barriers to participate in program There are no identifiable barriers or psychosocial needs.      Screening Interventions   Interventions Encouraged to exercise    Expected Outcomes Long Term Goal: Stressors or current issues are controlled or eliminated.;Short Term goal: Identification and review with participant of any Quality of Life or Depression concerns found by scoring the questionnaire.;Long Term goal: The participant improves quality of Life and PHQ9 Scores as seen by post scores and/or verbalization of changes             Quality of Life Scores:  Quality of Life - 02/08/21 1023       Quality of Life   Select Quality of Life      Quality of Life Scores   Health/Function Pre 21 %    Socioeconomic Pre 21 %    Psych/Spiritual Pre 21 %    Family Pre 21 %    GLOBAL Pre 21 %  Scores of 19 and below usually indicate a poorer quality of life in these areas.  A difference of  2-3 points is a clinically meaningful difference.  A difference of 2-3 points in the total score of the Quality of Life  Index has been associated with significant improvement in overall quality of life, self-image, physical symptoms, and general health in studies assessing change in quality of life.   PHQ-9: Recent Review Flowsheet Data     [facility]08[facility]21   Decreased Interest 1   Down, Depressed, Hopeless 0   PHQ - 2 Score 1   Altered sleeping 0   Tired, decreased energy 1   Change in appetite 0   Feeling bad or failure about yourself  0   Trouble concentrating 0   Moving slowly or fidgety/restless 0   Suicidal thoughts 0   PHQ-9 Score 2   Difficult doing work/chores Not difficult at all      Interpretation of Total Score  Total Score Depression Severity:  1-4 = Minimal depression, 5-9 = Mild depression, 10-14 = Moderate depression, 15-19 = Moderately severe depression, 20-27 = Severe depression   Psychosocial Evaluation and Intervention:  Psychosocial Evaluation - 02/08/21 0958       Psychosocial Evaluation & Interventions   Interventions Stress management education;Relaxation education;Encouraged to exercise with the program and follow exercise prescription    Comments Pt has no barriers to completing rehab. She has no identifiable psychosocial issues. She does take xanax to help her sleep. She has adrenal gland insufficiency, and the medications that she takes for this causes her to no sleep well. She has been short of breath and easily fatigued since she had COVID in August. This has also caused a lot of her hair to fall out which does cause her some stress. She reports that she copes well and has a good support system from her husband. Her goals while in the program are to decrease her shortness of breath with exertion and to lose about 12 lbs. She reports that she has never exercised before but she is willing to do whatever it takes to feel better again. She is optimistic that this program will help her reach her goals.    Expected Outcomes Pt will continue to not have any  identifiable psychosocial issues.    Continue Psychosocial Services  No Follow up required             Psychosocial Re-Evaluation:  Psychosocial Re-Evaluation     Row Name 02/14/21 1314 03/15/21 1320           Psychosocial Re-Evaluation   Current issues with Current Sleep Concerns Current Sleep Concerns      Comments Patient is new in program.  Her initial QOL score was 21 and her PHQ-9 was 2. She seems to enjoy coming to program and interacts well with class and staff. She demonstrates a very positive outlook and attitude. We will continue to monitor. Patient has completed 8 sessions and continues to have no psychosocial issues identified.  Her initial QOL score was 21 and her PHQ-9 was 2. She seems to enjoy coming to program and interacts well with class and staff. She demonstrates a very positive outlook and attitude. We will continue to monitor. Patient comments she is feeling stronger.      Expected Outcomes Patient will have no psychosocial barriers identified at discharge. Patient will have no psychosocial barriers identified at discharge.      Interventions Relaxation education;Stress management  education;Encouraged to attend Pulmonary Rehabilitation for the exercise Relaxation education;Stress management education;Encouraged to attend Pulmonary Rehabilitation for the exercise      Continue Psychosocial Services  No Follow up required No Follow up required               Psychosocial Discharge (Final Psychosocial Re-Evaluation):  Psychosocial Re-Evaluation - 03/15/21 1320       Psychosocial Re-Evaluation   Current issues with Current Sleep Concerns    Comments Patient has completed 8 sessions and continues to have no psychosocial issues identified.  Her initial QOL score was 21 and her PHQ-9 was 2. She seems to enjoy coming to program and interacts well with class and staff. She demonstrates a very positive outlook and attitude. We will continue to monitor. Patient comments  she is feeling stronger.    Expected Outcomes Patient will have no psychosocial barriers identified at discharge.    Interventions Relaxation education;Stress management education;Encouraged to attend Pulmonary Rehabilitation for the exercise    Continue Psychosocial Services  No Follow up required              Education: Education Goals: Education classes will be provided on a weekly basis, covering required topics. Participant will state understanding/return demonstration of topics presented.  Learning Barriers/Preferences:  Learning Barriers/Preferences - 02/08/21 0854       Learning Barriers/Preferences   Learning Barriers None    Learning Preferences Written Material;Individual Instruction             Education Topics: How Lungs Work and Diseases: - Discuss the anatomy of the lungs and diseases that can affect the lungs, such as COPD.   Exercise: -Discuss the importance of exercise, FITT principles of exercise, normal and abnormal responses to exercise, and how to exercise safely.   Environmental Irritants: -Discuss types of environmental irritants and how to limit exposure to environmental irritants.   Meds/Inhalers and oxygen: - Discuss respiratory medications, definition of an inhaler and oxygen, and the proper way to use an inhaler and oxygen.   Energy Saving Techniques: - Discuss methods to conserve energy and decrease shortness of breath when performing activities of daily living.    Bronchial Hygiene / Breathing Techniques: - Discuss breathing mechanics, pursed-lip breathing technique,  proper posture, effective ways to clear airways, and other functional breathing techniques Flowsheet Row PULMONARY REHAB OTHER RESPIRATORY from 03/15/2021 in [place]  Date 02/15/21  Educator Handout  Instruction Review Code 1- Research scientist (medical): - Provides group verbal and written instruction about the  health risks of elevated stress, cause of high stress, and healthy ways to reduce stress.   Nutrition I: Fats: - Discuss the types of cholesterol, what cholesterol does to the body, and how cholesterol levels can be controlled. Flowsheet Row PULMONARY REHAB OTHER RESPIRATORY from 03/15/2021 in [place]  Date 03/01/21  Educator DF  Instruction Review Code 2- Demonstrated Understanding       Nutrition II: Labels: -Discuss the different components of food labels and how to read food labels.   Respiratory Infections: - Discuss the signs and symptoms of respiratory infections, ways to prevent respiratory infections, and the importance of seeking medical treatment when having a respiratory infection. Flowsheet Row PULMONARY REHAB OTHER RESPIRATORY from 03/15/2021 in [place]  Date 03/15/21  Educator DF  Instruction Review Code 2- Demonstrated Understanding       Stress I: Signs and Symptoms: - Discuss the causes of  stress, how stress may lead to anxiety and depression, and ways to limit stress.   Stress II: Relaxation: -Discuss relaxation techniques to limit stress.   Oxygen for Home/Travel: - Discuss how to prepare for travel when on oxygen and proper ways to transport and store oxygen to ensure safety.   Knowledge Questionnaire Score:  Knowledge Questionnaire Score - 02/08/21 0855       Knowledge Questionnaire Score   Pre Score 18/18             Core Components/Risk Factors/Patient Goals at Admission:  Personal Goals and Risk Factors at Admission - 02/08/21 0858       Core Components/Risk Factors/Patient Goals on Admission    Weight Management Yes;Weight Loss    Intervention Weight Management: Develop a combined nutrition and exercise program designed to reach desired caloric intake, while maintaining appropriate intake of nutrient and fiber, sodium and fats, and appropriate energy expenditure required for the  weight goal.;Weight Management: Provide education and appropriate resources to help participant work on and attain dietary goals.    Expected Outcomes Short Term: Continue to assess and modify interventions until short term weight is achieved;Long Term: Adherence to nutrition and physical activity/exercise program aimed toward attainment of established weight goal;Weight Loss: Understanding of general recommendations for a balanced deficit meal plan, which promotes 1-2 lb weight loss per week and includes a negative energy balance of [phone] kcal/d;Understanding recommendations for meals to include 15-35% energy as protein, 25-35% energy from fat, 35-60% energy from carbohydrates, less than 200mg  of dietary cholesterol, 20-35 gm of total fiber daily;Understanding of distribution of calorie intake throughout the day with the consumption of 4-5 meals/snacks    Improve shortness of breath with ADL's Yes    Intervention Provide education, individualized exercise plan and daily activity instruction to help decrease symptoms of SOB with activities of daily living.    Expected Outcomes Short Term: Improve cardiorespiratory fitness to achieve a reduction of symptoms when performing ADLs;Long Term: Be able to perform more ADLs without symptoms or delay the onset of symptoms             Core Components/Risk Factors/Patient Goals Review:   Goals and Risk Factor Review     Row Name 02/14/21 1331 03/15/21 1331           Core Components/Risk Factors/Patient Goals Review   Personal Goals Review Improve shortness of breath with ADL's;Weight Management/Obesity;Develop more efficient breathing techniques such as purse lipped breathing and diaphragmatic breathing and practicing self-pacing with activity. Improve shortness of breath with ADL's;Weight Management/Obesity;Develop more efficient breathing techniques such as purse lipped breathing and diaphragmatic breathing and practicing self-pacing with activity.       Review Patien referred to PR with DOE & post COVID by Dr. Vaughan Browner.  She is new to the program completing the 1st session. Her perseonal goals for the program are to get stronger, decreased DOE , loss weight and feel better again.  We will continue to monitor her progress as she works toward meeting these goals. Patient has completed 8 sessoins. She is doing well in the program with progression and consistent attendance.  Her personal goals for the program are to get stronger, decreased DOE , loss weight and feel better again.  We will continue to monitor her progress as she works toward meeting these goals.  Continues to demonstrate a positive outlook . Tolerated exercise without any resp distress O2 Sat 95%- 97% no oxygen needed.      Expected Outcomes Patient  will complete the program meeting both program and personal goals. Patient will complete the program meeting both program and personal goals.               Core Components/Risk Factors/Patient Goals at Discharge (Final Review):   Goals and Risk Factor Review - 03/15/21 1331       Core Components/Risk Factors/Patient Goals Review   Personal Goals Review Improve shortness of breath with ADL's;Weight Management/Obesity;Develop more efficient breathing techniques such as purse lipped breathing and diaphragmatic breathing and practicing self-pacing with activity.    Review Patient has completed 8 sessoins. She is doing well in the program with progression and consistent attendance.  Her personal goals for the program are to get stronger, decreased DOE , loss weight and feel better again.  We will continue to monitor her progress as she works toward meeting these goals.  Continues to demonstrate a positive outlook . Tolerated exercise without any resp distress O2 Sat 95%- 97% no oxygen needed.    Expected Outcomes Patient will complete the program meeting both program and personal goals.             ITP Comments:   Comments: ITP  REVIEW Pt is making expected progress toward pulmonary rehab goals after completing 10 sessions. Recommend continued exercise, life style modification, education, and utilization of breathing techniques to increase stamina and strength and decrease shortness of breath with exertion.

## 2021-03-22 ENCOUNTER — Encounter (HOSPITAL_COMMUNITY)
Admission: RE | Admit: 2021-03-22 | Discharge: 2021-03-22 | Disposition: A | Discharge status: Home / Self Care | Payer: Medicare Other | Source: Ambulatory Visit | Attending: Pulmonary Disease | Admitting: Pulmonary Disease

## 2021-03-22 DIAGNOSIS — R0609 Other forms of dyspnea: Secondary | ICD-10-CM | POA: Diagnosis not present

## 2021-03-22 DIAGNOSIS — U099 Post covid-19 condition, unspecified: Secondary | ICD-10-CM

## 2021-03-22 NOTE — Progress Notes (Signed)
Daily Session Note  Patient Details  Name: Monica Harrison MRN: [id number] Date of Birth: 1950/05/30 Referring Provider:   Flowsheet Row PULMONARY REHAB OTHER RESP ORIENTATION from 02/08/2021 in [place]  Referring Provider Dr. Vaughan Browner       Encounter Date: 03/22/2021  Check In:  Session Check In - 03/22/21 1045       Check-In   Supervising physician immediately available to respond to emergencies [facility] MD immediately available    Physician(s) Dr. Gasper Sells    Location [facility]    Staff Present Hoy Register, MS, ACSM-CEP, Exercise Physiologist;Heather Zigmund Daniel, Exercise Physiologist    Virtual Visit No    Medication changes reported     No    Fall or balance concerns reported    No    Tobacco Cessation No Change    Warm-up and Cool-down Performed as group-led instruction    Resistance Training Performed Yes    VAD Patient? No    PAD/SET Patient? No      Pain Assessment   Currently in Pain? No/denies    Multiple Pain Sites No             Capillary Blood Glucose: No results found for this or any previous visit (from the past 24 hour(s)).    Social History   Tobacco Use  Smoking Status Never  Smokeless Tobacco Never    Goals Met:  Independence with exercise equipment Exercise tolerated well No report of concerns or symptoms today Strength training completed today  Goals Unmet:  Not Applicable  Comments: checkout time is 1145   Dr. Kathie Dike is Medical Director for [facility].

## 2021-03-22 NOTE — Progress Notes (Signed)
I have reviewed a Home Exercise Prescription with Ottie Glazier . Diane is not currently exercising at home.  The patient was advised to walk 3 days a week for 30-45 minutes.  Aurea and I discussed how to progress their exercise prescription.  The patient stated that their goals were build back strength.  The patient stated that they understand the exercise prescription.  We reviewed exercise guidelines, target heart rate during exercise, RPE Scale, weather conditions, NTG use, endpoints for exercise, warmup and cool down.  Patient is encouraged to come to me with any questions. I will continue to follow up with the patient to assist them with progression and safety.

## 2021-03-27 ENCOUNTER — Encounter (HOSPITAL_COMMUNITY): Payer: Medicare Other

## 2021-03-29 ENCOUNTER — Encounter (HOSPITAL_COMMUNITY): Payer: Medicare Other

## 2021-04-01 ENCOUNTER — Encounter (HOSPITAL_COMMUNITY)
Admission: EM | Disposition: A | Discharge status: Home / Self Care | Payer: Self-pay | Source: Home / Self Care | Attending: Cardiology

## 2021-04-01 ENCOUNTER — Inpatient Hospital Stay (HOSPITAL_COMMUNITY): Payer: Medicare Other

## 2021-04-01 ENCOUNTER — Inpatient Hospital Stay (HOSPITAL_COMMUNITY)
Admission: EM | Admit: 2021-04-01 | Discharge: 2021-04-03 | DRG: 247 | Disposition: A | Discharge status: Home / Self Care | Payer: Medicare Other | Attending: Cardiology | Admitting: Cardiology

## 2021-04-01 ENCOUNTER — Encounter (HOSPITAL_COMMUNITY): Payer: Self-pay | Admitting: Emergency Medicine

## 2021-04-01 ENCOUNTER — Other Ambulatory Visit: Payer: Self-pay

## 2021-04-01 ENCOUNTER — Emergency Department (HOSPITAL_COMMUNITY): Payer: Medicare Other

## 2021-04-01 DIAGNOSIS — E079 Disorder of thyroid, unspecified: Secondary | ICD-10-CM | POA: Diagnosis present

## 2021-04-01 DIAGNOSIS — E2740 Unspecified adrenocortical insufficiency: Secondary | ICD-10-CM | POA: Diagnosis present

## 2021-04-01 DIAGNOSIS — E118 Type 2 diabetes mellitus with unspecified complications: Secondary | ICD-10-CM

## 2021-04-01 DIAGNOSIS — Z8616 Personal history of COVID-19: Secondary | ICD-10-CM | POA: Diagnosis not present

## 2021-04-01 DIAGNOSIS — Z79899 Other long term (current) drug therapy: Secondary | ICD-10-CM

## 2021-04-01 DIAGNOSIS — R001 Bradycardia, unspecified: Secondary | ICD-10-CM

## 2021-04-01 DIAGNOSIS — Z881 Allergy status to other antibiotic agents status: Secondary | ICD-10-CM

## 2021-04-01 DIAGNOSIS — E785 Hyperlipidemia, unspecified: Secondary | ICD-10-CM | POA: Diagnosis present

## 2021-04-01 DIAGNOSIS — I213 ST elevation (STEMI) myocardial infarction of unspecified site: Secondary | ICD-10-CM | POA: Diagnosis present

## 2021-04-01 DIAGNOSIS — I2510 Atherosclerotic heart disease of native coronary artery without angina pectoris: Secondary | ICD-10-CM

## 2021-04-01 DIAGNOSIS — I2111 ST elevation (STEMI) myocardial infarction involving right coronary artery: Secondary | ICD-10-CM | POA: Diagnosis not present

## 2021-04-01 DIAGNOSIS — E739 Lactose intolerance, unspecified: Secondary | ICD-10-CM | POA: Diagnosis present

## 2021-04-01 DIAGNOSIS — I959 Hypotension, unspecified: Secondary | ICD-10-CM | POA: Diagnosis present

## 2021-04-01 DIAGNOSIS — I2119 ST elevation (STEMI) myocardial infarction involving other coronary artery of inferior wall: Secondary | ICD-10-CM | POA: Diagnosis present

## 2021-04-01 DIAGNOSIS — R7303 Prediabetes: Secondary | ICD-10-CM | POA: Diagnosis present

## 2021-04-01 DIAGNOSIS — Z955 Presence of coronary angioplasty implant and graft: Secondary | ICD-10-CM | POA: Insufficient documentation

## 2021-04-01 DIAGNOSIS — Z91018 Allergy to other foods: Secondary | ICD-10-CM

## 2021-04-01 DIAGNOSIS — E1169 Type 2 diabetes mellitus with other specified complication: Secondary | ICD-10-CM

## 2021-04-01 HISTORY — PX: LEFT HEART CATH AND CORONARY ANGIOGRAPHY: CATH118249

## 2021-04-01 HISTORY — DX: Hyperlipidemia, unspecified: E78.5

## 2021-04-01 HISTORY — DX: Prediabetes: R73.03

## 2021-04-01 HISTORY — DX: Atherosclerotic heart disease of native coronary artery without angina pectoris: I25.10

## 2021-04-01 HISTORY — PX: CORONARY/GRAFT ACUTE MI REVASCULARIZATION: CATH118305

## 2021-04-01 HISTORY — DX: ST elevation (STEMI) myocardial infarction of unspecified site: I21.3

## 2021-04-01 LAB — MRSA NEXT GEN BY PCR, NASAL: MRSA by PCR Next Gen: NOT DETECTED

## 2021-04-01 LAB — I-STAT CHEM 8, ED
BUN: 22 mg/dL (ref 8–23)
Calcium, Ion: 1.14 mmol/L — ABNORMAL LOW (ref 1.15–1.40)
Chloride: 106 mmol/L (ref 98–111)
Creatinine, Ser: 0.6 mg/dL (ref 0.44–1.00)
Glucose, Bld: 180 mg/dL — ABNORMAL HIGH (ref 70–99)
HCT: 37 % (ref 36.0–46.0)
Hemoglobin: 12.6 g/dL (ref 12.0–15.0)
Potassium: 4.2 mmol/L (ref 3.5–5.1)
Sodium: 139 mmol/L (ref 135–145)
TCO2: 23 mmol/L (ref 22–32)

## 2021-04-01 LAB — LIPID PANEL
Cholesterol: 224 mg/dL — ABNORMAL HIGH (ref 0–200)
HDL: 42 mg/dL (ref >40)
LDL Cholesterol: 151 mg/dL — ABNORMAL HIGH (ref 0–99)
Total CHOL/HDL Ratio: 5.3 RATIO
Triglycerides: 157 mg/dL — ABNORMAL HIGH (ref <150)
VLDL: 31 mg/dL (ref 0–40)

## 2021-04-01 LAB — CBC WITH DIFFERENTIAL/PLATELET
Abs Immature Granulocytes: 0.05 10*3/uL (ref 0.00–0.07)
Basophils Absolute: 0.1 10*3/uL (ref 0.0–0.1)
Basophils Relative: 1 %
Eosinophils Absolute: 0.2 10*3/uL (ref 0.0–0.5)
Eosinophils Relative: 2 %
HCT: 37.3 % (ref 36.0–46.0)
Hemoglobin: 12.6 g/dL (ref 12.0–15.0)
Immature Granulocytes: 1 %
Lymphocytes Relative: 35 %
Lymphs Abs: 3.5 10*3/uL (ref 0.7–4.0)
MCH: 30.2 pg (ref 26.0–34.0)
MCHC: 33.8 g/dL (ref 30.0–36.0)
MCV: 89.4 fL (ref 80.0–100.0)
Monocytes Absolute: 1.1 10*3/uL — ABNORMAL HIGH (ref 0.1–1.0)
Monocytes Relative: 11 %
Neutro Abs: 5 10*3/uL (ref 1.7–7.7)
Neutrophils Relative %: 50 %
Platelets: 278 10*3/uL (ref 150–400)
RBC: 4.17 MIL/uL (ref 3.87–5.11)
RDW: 12.8 % (ref 11.5–15.5)
WBC: 9.9 10*3/uL (ref 4.0–10.5)
nRBC: 0 % (ref 0.0–0.2)

## 2021-04-01 LAB — COMPREHENSIVE METABOLIC PANEL
ALT: 29 U/L (ref 0–44)
AST: 37 U/L (ref 15–41)
Albumin: 3.5 g/dL (ref 3.5–5.0)
Alkaline Phosphatase: 47 U/L (ref 38–126)
Anion gap: 9 (ref 5–15)
BUN: 17 mg/dL (ref 8–23)
CO2: 23 mmol/L (ref 22–32)
Calcium: 9.4 mg/dL (ref 8.9–10.3)
Chloride: 106 mmol/L (ref 98–111)
Creatinine, Ser: 0.69 mg/dL (ref 0.44–1.00)
GFR, Estimated: >60 mL/min (ref >60)
Glucose, Bld: 180 mg/dL — ABNORMAL HIGH (ref 70–99)
Potassium: 4.2 mmol/L (ref 3.5–5.1)
Sodium: 138 mmol/L (ref 135–145)
Total Bilirubin: 0.6 mg/dL (ref 0.3–1.2)
Total Protein: 6.7 g/dL (ref 6.5–8.1)

## 2021-04-01 LAB — HEMOGLOBIN A1C
Hgb A1c MFr Bld: 6.7 % — ABNORMAL HIGH (ref 4.8–5.6)
Mean Plasma Glucose: 145.59 mg/dL

## 2021-04-01 LAB — APTT: aPTT: 31 seconds (ref 24–36)

## 2021-04-01 LAB — PROTIME-INR
INR: 1 (ref 0.8–1.2)
Prothrombin Time: 13 seconds (ref 11.4–15.2)

## 2021-04-01 LAB — RESP PANEL BY RT-PCR (FLU A&B, COVID) ARPGX2
Influenza A by PCR: NEGATIVE
Influenza B by PCR: NEGATIVE
SARS Coronavirus 2 by RT PCR: NEGATIVE

## 2021-04-01 LAB — TROPONIN I (HIGH SENSITIVITY)
Troponin I (High Sensitivity): 5 ng/L (ref <18)
Troponin I (High Sensitivity): 289 ng/L (ref <18)

## 2021-04-01 LAB — POCT ACTIVATED CLOTTING TIME: Activated Clotting Time: 299 seconds

## 2021-04-01 SURGERY — CORONARY/GRAFT ACUTE MI REVASCULARIZATION
Anesthesia: LOCAL

## 2021-04-01 MED ORDER — ENZYME DIGEST PO CAPS: 1.0000 | ORAL_CAPSULE | Freq: Two times a day (BID) | ORAL | Status: DC

## 2021-04-01 MED ORDER — HEPARIN (PORCINE) IN NACL 2000-0.9 UNIT/L-% IV SOLN: INTRAVENOUS | Status: DC | PRN

## 2021-04-01 MED ORDER — SODIUM CHLORIDE 0.9% FLUSH
3.0000 mL | Freq: Two times a day (BID) | INTRAVENOUS | Status: DC
  Administered 2021-04-01 – 2021-04-03 (×4): 3 mL via INTRAVENOUS

## 2021-04-01 MED ORDER — ALPRAZOLAM 0.25 MG PO TABS
0.2500 mg | ORAL_TABLET | Freq: Every day | ORAL | Status: DC
  Administered 2021-04-01 – 2021-04-02 (×2): 0.25 mg via ORAL
  Filled 2021-04-01 (×2): qty 1

## 2021-04-01 MED ORDER — POLYVINYL ALCOHOL 1.4 % OP SOLN
1.0000 [drp] | Freq: Four times a day (QID) | OPHTHALMIC | Status: DC
  Administered 2021-04-01 – 2021-04-03 (×4): 1 [drp] via OPHTHALMIC
  Filled 2021-04-01 (×2): qty 15

## 2021-04-01 MED ORDER — ACETAMINOPHEN 325 MG PO TABS
650.0000 mg | ORAL_TABLET | ORAL | Status: DC | PRN
  Administered 2021-04-01 – 2021-04-02 (×2): 650 mg via ORAL
  Filled 2021-04-01 (×2): qty 2

## 2021-04-01 MED ORDER — CANGRELOR BOLUS VIA INFUSION
INTRAVENOUS | Status: DC | PRN
  Administered 2021-04-01: 12:00:00 2493 ug via INTRAVENOUS

## 2021-04-01 MED ORDER — NOREPINEPHRINE 4 MG/250ML-% IV SOLN
2.0000 ug/min | INTRAVENOUS | Status: DC
  Administered 2021-04-01: 12:00:00 10 ug/min via INTRAVENOUS

## 2021-04-01 MED ORDER — HEPARIN SODIUM (PORCINE) 1000 UNIT/ML IJ SOLN
INTRAMUSCULAR | Status: DC | PRN
  Administered 2021-04-01: 4500 [IU] via INTRAVENOUS

## 2021-04-01 MED ORDER — BENZONATATE 100 MG PO CAPS: 200.0000 mg | ORAL_CAPSULE | Freq: Three times a day (TID) | ORAL | Status: DC | PRN

## 2021-04-01 MED ORDER — VERAPAMIL HCL 2.5 MG/ML IV SOLN
INTRAVENOUS | Status: DC | PRN
  Administered 2021-04-01: 12:00:00 10 mL via INTRA_ARTERIAL

## 2021-04-01 MED ORDER — MORPHINE SULFATE (PF) 2 MG/ML IV SOLN: 2.0000 mg | INTRAVENOUS | Status: DC | PRN

## 2021-04-01 MED ORDER — ATROPINE SULFATE 1 MG/10ML IJ SOSY
PREFILLED_SYRINGE | INTRAMUSCULAR | Status: DC | PRN
  Administered 2021-04-01 (×2): .5 mg via INTRAVENOUS

## 2021-04-01 MED ORDER — LIDOCAINE 5 % EX PTCH
1.0000 | MEDICATED_PATCH | CUTANEOUS | Status: DC
  Administered 2021-04-02 – 2021-04-03 (×2): 1 via TRANSDERMAL
  Filled 2021-04-01 (×2): qty 1

## 2021-04-01 MED ORDER — HYDRALAZINE HCL 20 MG/ML IJ SOLN: 10.0000 mg | INTRAMUSCULAR | Status: AC | PRN

## 2021-04-01 MED ORDER — HEPARIN (PORCINE) IN NACL 1000-0.9 UT/500ML-% IV SOLN
INTRAVENOUS | Status: DC | PRN
  Administered 2021-04-01 (×2): 500 mL

## 2021-04-01 MED ORDER — ASCORBIC ACID 500 MG PO TABS
2000.0000 mg | ORAL_TABLET | Freq: Two times a day (BID) | ORAL | Status: DC
  Administered 2021-04-01 – 2021-04-03 (×4): 2000 mg via ORAL
  Filled 2021-04-01 (×4): qty 4

## 2021-04-01 MED ORDER — SODIUM CHLORIDE 0.9% FLUSH: 3.0000 mL | INTRAVENOUS | Status: DC | PRN

## 2021-04-01 MED ORDER — LIDOCAINE HCL (PF) 1 % IJ SOLN
INTRAMUSCULAR | Status: DC | PRN
  Administered 2021-04-01: 2 mL

## 2021-04-01 MED ORDER — ASPIRIN 81 MG PO CHEW
81.0000 mg | CHEWABLE_TABLET | Freq: Every day | ORAL | Status: DC
  Administered 2021-04-02 – 2021-04-03 (×2): 81 mg via ORAL
  Filled 2021-04-01 (×2): qty 1

## 2021-04-01 MED ORDER — CANGRELOR TETRASODIUM 50 MG IV SOLR
INTRAVENOUS | Status: AC
  Filled 2021-04-01: qty 50

## 2021-04-01 MED ORDER — MIDAZOLAM HCL 2 MG/2ML IJ SOLN
INTRAMUSCULAR | Status: DC | PRN
  Administered 2021-04-01: 1 mg via INTRAVENOUS

## 2021-04-01 MED ORDER — SODIUM CHLORIDE 0.9 % IV SOLN
INTRAVENOUS | Status: AC | PRN
  Administered 2021-04-01: 12:00:00 4 ug/kg/min via INTRAVENOUS

## 2021-04-01 MED ORDER — FENTANYL CITRATE (PF) 100 MCG/2ML IJ SOLN
INTRAMUSCULAR | Status: DC | PRN
  Administered 2021-04-01: 25 ug via INTRAVENOUS

## 2021-04-01 MED ORDER — ALBUTEROL SULFATE (2.5 MG/3ML) 0.083% IN NEBU
2.5000 mg | INHALATION_SOLUTION | Freq: Four times a day (QID) | RESPIRATORY_TRACT | Status: DC | PRN
  Administered 2021-04-01 – 2021-04-03 (×2): 2.5 mg via RESPIRATORY_TRACT
  Filled 2021-04-01 (×2): qty 3

## 2021-04-01 MED ORDER — SODIUM CHLORIDE 0.9 % IV SOLN: INTRAVENOUS | Status: AC

## 2021-04-01 MED ORDER — TICAGRELOR 90 MG PO TABS
90.0000 mg | ORAL_TABLET | Freq: Two times a day (BID) | ORAL | Status: DC
  Administered 2021-04-01 – 2021-04-03 (×4): 90 mg via ORAL
  Filled 2021-04-01 (×4): qty 1

## 2021-04-01 MED ORDER — ONDANSETRON HCL 4 MG/2ML IJ SOLN: 4.0000 mg | Freq: Four times a day (QID) | INTRAMUSCULAR | Status: DC | PRN

## 2021-04-01 MED ORDER — FLUTICASONE PROPIONATE 50 MCG/ACT NA SUSP
2.0000 | Freq: Every day | NASAL | Status: DC
  Filled 2021-04-01: qty 16

## 2021-04-01 MED ORDER — SODIUM CHLORIDE 0.9 % IV SOLN: INTRAVENOUS | Status: DC

## 2021-04-01 MED ORDER — CHLORHEXIDINE GLUCONATE CLOTH 2 % EX PADS
6.0000 | MEDICATED_PAD | Freq: Every day | CUTANEOUS | Status: DC
  Administered 2021-04-02: 10:00:00 6 via TOPICAL

## 2021-04-01 MED ORDER — HEPARIN SODIUM (PORCINE) 5000 UNIT/ML IJ SOLN
4000.0000 [IU] | Freq: Once | INTRAMUSCULAR | Status: AC
  Administered 2021-04-01: 11:00:00 4000 [IU] via INTRAVENOUS
  Filled 2021-04-01: qty 1

## 2021-04-01 MED ORDER — ALBUTEROL SULFATE HFA 108 (90 BASE) MCG/ACT IN AERS: 2.0000 | INHALATION_SPRAY | Freq: Four times a day (QID) | RESPIRATORY_TRACT | Status: DC | PRN

## 2021-04-01 MED ORDER — ATROPINE SULFATE 1 MG/10ML IJ SOSY
PREFILLED_SYRINGE | INTRAMUSCULAR | Status: AC
  Filled 2021-04-01: qty 10

## 2021-04-01 MED ORDER — MIDAZOLAM HCL 2 MG/2ML IJ SOLN
INTRAMUSCULAR | Status: AC
  Filled 2021-04-01: qty 2

## 2021-04-01 MED ORDER — OMEGA-3-ACID ETHYL ESTERS 1 G PO CAPS
1.0000 g | ORAL_CAPSULE | Freq: Two times a day (BID) | ORAL | Status: DC
Start: 2021-04-01 — End: 2021-04-03
  Administered 2021-04-01 – 2021-04-03 (×4): 1 g via ORAL
  Filled 2021-04-01 (×4): qty 1

## 2021-04-01 MED ORDER — POLYETHYLENE GLYCOL 3350 17 G PO PACK
17.0000 g | PACK | Freq: Every day | ORAL | Status: DC
Start: 2021-04-01 — End: 2021-04-01

## 2021-04-01 MED ORDER — METOPROLOL TARTRATE 12.5 MG HALF TABLET: 12.5000 mg | ORAL_TABLET | Freq: Two times a day (BID) | ORAL | Status: DC

## 2021-04-01 MED ORDER — NOREPINEPHRINE 4 MG/250ML-% IV SOLN
INTRAVENOUS | Status: AC
  Filled 2021-04-01: qty 250

## 2021-04-01 MED ORDER — SODIUM CHLORIDE 0.9 % IV SOLN: 250.0000 mL | INTRAVENOUS | Status: DC | PRN

## 2021-04-01 MED ORDER — LIDOCAINE HCL (PF) 1 % IJ SOLN
INTRAMUSCULAR | Status: AC
  Filled 2021-04-01: qty 30

## 2021-04-01 MED ORDER — HYDROCORTISONE 5 MG PO TABS: 5.0000 mg | ORAL_TABLET | ORAL | Status: DC

## 2021-04-01 MED ORDER — HYDROCORTISONE 5 MG PO TABS
5.0000 mg | ORAL_TABLET | Freq: Every day | ORAL | Status: DC
  Administered 2021-04-01 – 2021-04-02 (×2): 5 mg via ORAL
  Filled 2021-04-01 (×4): qty 1

## 2021-04-01 MED ORDER — NOREPINEPHRINE BITARTRATE 1 MG/ML IV SOLN
INTRAVENOUS | Status: AC | PRN
  Administered 2021-04-01: 12:00:00 10 ug/min via INTRAVENOUS

## 2021-04-01 MED ORDER — HEPARIN (PORCINE) IN NACL 1000-0.9 UT/500ML-% IV SOLN
INTRAVENOUS | Status: AC
  Filled 2021-04-01: qty 1000

## 2021-04-01 MED ORDER — IOHEXOL 350 MG/ML SOLN
INTRAVENOUS | Status: DC | PRN
  Administered 2021-04-01: 12:00:00 105 mL

## 2021-04-01 MED ORDER — CO Q 10 100 MG PO CAPS: 100.0000 mg | ORAL_CAPSULE | Freq: Every evening | ORAL | Status: DC

## 2021-04-01 MED ORDER — OMEGA 3 1000 MG PO CAPS: ORAL_CAPSULE | Freq: Two times a day (BID) | ORAL | Status: DC

## 2021-04-01 MED ORDER — FENTANYL CITRATE (PF) 100 MCG/2ML IJ SOLN
INTRAMUSCULAR | Status: AC
  Filled 2021-04-01: qty 2

## 2021-04-01 MED ORDER — POLYETHYLENE GLYCOL 3350 17 G PO PACK
17.0000 g | PACK | Freq: Every day | ORAL | Status: DC | PRN
  Administered 2021-04-01: 18:00:00 17 g via ORAL
  Filled 2021-04-01: qty 1

## 2021-04-01 MED ORDER — LABETALOL HCL 5 MG/ML IV SOLN: 10.0000 mg | INTRAVENOUS | Status: AC | PRN

## 2021-04-01 MED ORDER — HYDROCORTISONE 10 MG PO TABS
10.0000 mg | ORAL_TABLET | Freq: Every day | ORAL | Status: DC
  Administered 2021-04-02 – 2021-04-03 (×2): 10 mg via ORAL
  Filled 2021-04-01 (×3): qty 1

## 2021-04-01 MED ORDER — VERAPAMIL HCL 2.5 MG/ML IV SOLN
INTRAVENOUS | Status: AC
  Filled 2021-04-01: qty 2

## 2021-04-01 MED ORDER — HEPARIN SODIUM (PORCINE) 1000 UNIT/ML IJ SOLN
INTRAMUSCULAR | Status: AC
  Filled 2021-04-01: qty 10

## 2021-04-01 MED ORDER — TICAGRELOR 90 MG PO TABS
180.0000 mg | ORAL_TABLET | Freq: Once | ORAL | Status: AC
  Administered 2021-04-01: 13:00:00 180 mg via ORAL
  Filled 2021-04-01: qty 2

## 2021-04-01 SURGICAL SUPPLY — 19 items
BALL SAPPHIRE NC24 3.0X22 (BALLOONS) ×3
BALLN SAPPHIRE 2.0X15 (BALLOONS) ×3
BALLN SAPPHIRE 2.5X15 (BALLOONS) ×3
BALLN SAPPHIRE 2.5X20 (BALLOONS) ×3
CATH LAUNCHER 6FR JR4 (CATHETERS) ×3
CATH OPTITORQUE TIG 4.0 5F (CATHETERS) ×3
DEVICE RAD COMP TR BAND LRG (VASCULAR PRODUCTS) ×3
GLIDESHEATH SLEND SS 6F .021 (SHEATH) ×3
INQWIRE 1.5J .035X260CM (WIRE) ×3
KIT ENCORE 26 ADVANTAGE (KITS) ×3
KIT HEART LEFT (KITS) ×3
PACK CARDIAC CATHETERIZATION (CUSTOM PROCEDURE TRAY) ×3
SHEATH PROBE COVER 6X72 (BAG) ×3
STENT ONYX FRONTIER 2.75X30 (Permanent Stent) ×3 IMPLANT
TRANSDUCER W/STOPCOCK (MISCELLANEOUS) ×3
TUBING CIL FLEX 10 FLL-RA (TUBING) ×3
WIRE ASAHI PROWATER 180CM (WIRE) ×3
WIRE HI TORQ WHISPER MS 190CM (WIRE) ×3
WIRE RUNTHROUGH .014X180CM (WIRE) ×3

## 2021-04-01 NOTE — ED Triage Notes (Signed)
Pt arrives via [facility] as code stemi. Pt stated having cp while at church around 0920, midsternal non-radiating, pt was diaphoretic and c/o nausea. EMS gave 10mg  morphine, 4mg  zofran, 324mg  ASA, and NS. 20g LAC. Pt on 2L O2 at 92%. 123/77, . Pt denies any cardiac hx

## 2021-04-01 NOTE — Brief Op Note (Signed)
° °  BRIEF CARDIAC CATH- PCI NOTE  04/01/2021  12:42 PM  PROCEDURE:  Procedure(s): Coronary/Graft Acute MI Revascularization (N/A) LEFT HEART CATH AND CORONARY ANGIOGRAPHY (N/A)  SURGEON:  Surgeon(s) and Role:    * Leonie Man, MD - Primary  PATIENT:  Monica Harrison  70 y.o. 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 Preserved LVEF Borderline hypotension requiring Levophed infusion.   PROCEDURE PERFORMED Time Out: Verified patient identification, verified procedure, site/side was marked, verified correct patient position, special equipment/implants available, medications/allergies/relevent history reviewed, required imaging and test results available. Performed.  Access:  RIGH Radial Artery: 6 Fr sheath -- Seldinger technique using Micropuncture Kit -- Direct ultrasound guidance used.  Permanent image obtained and placed on chart. -- 10 mL radial cocktail IA; 4500 Units IV Heparin  Left Heart Catheterization: 5&6Fr Catheters advanced or exchanged over a J-wire under direct fluoroscopic guidance into the ascending aorta; TIG 4.0 catheter advanced first.  * LV Hemodynamics (LV Gram): TIG 4.0 Catheter * Left & Right Coronary Artery Cineangiography: TIG 4.0 Catheter   Review of initial angiography revealed: 100 and occluded proximal RCA3 Preparations are made for RCA PCI's -> Patient was loaded with Kengreal as she was sedated, nauseated and had multiple doses of morphine. -->Additional dose of IV heparin to achieve and maintain an ACT between 250 ms.  PCI of the RCA performed: See op note findings -> 6 [place] JR4 catheter, ultimately a whisper wire was finally used advanced across the lesion.  Started with a 2.3 mm balloon  followed by 2.51 x 20 mm balloon.  Onyx Frontier DES 2.75 mm x 30 mm postdilated with 3.0 Parnell balloon to 3.1 mm.  Upon completion of Angiogaphy, the catheter was removed completely out of the body over a wire, without complication.  Radial sheath removed in the Cardiac Catheterization lab with TR Band placed for hemostasis.  14 mL air   MEDICATIONS SQ Lidocaine 3 mL Radial Cocktail: 3 mg Verapmil in 10 mL NS Heparin: 4500 units Kengreal Bolus and infusion IV Levophed started at 10 mg/KG/[place] reduced to 8 prior to moving to the CCU.   EBL:  <20 Ml   BLOOD ADMINISTERED: None  DICTATION: .Note written in EPIC  PLAN OF CARE: Admit to inpatient  -> loaded with Brilinta upon waking up in the CCU.  Then stop Kengreal infusion. Check 2D echocardiogram High-dose high density statin Low-dose beta-blocker  PATIENT DISPOSITION:  PACU - hemodynamically stable.   Delay start of Pharmacological VTE agent (>24hrs) due to surgical blood loss or risk of bleeding: not applicable   Glenetta Hew, MD

## 2021-04-01 NOTE — ED Provider Notes (Signed)
Emergency Department Provider Note   I have reviewed the triage vital signs and the nursing notes.   HISTORY  Chief Complaint Code STEMI   HPI Monica Harrison is a 70 y.o. female with PMH of adrenal insufficiency presents to the emergency department as a code STEMI.  Patient arrives by EMS from [facility].  She states she went to church began having central chest discomfort with some radiation into the shoulder.  She initially thought she might be having an anxiety attack and so took her Xanax at church but when symptoms worsen she ultimately called EMS.  On scene, they identified a STEMI and activated from the field.  Patient received 324 mg of aspirin, 2 sublingual nitroglycerin and a total of 10 mg of morphine in route.  She arrives without active chest pain.  They had a transient episode of hypotension which has resolved with 200 mL of IV fluids. Patient is not anticoagulated. Last PO was 8:30 AM.    Past Medical History:  Diagnosis Date   Adrenal insufficiency (HCC)    Thyroid disease     Patient Active Problem List   Diagnosis Date Noted   Pneumonia due to COVID-19 virus 11/19/2020   Acute hypoxemic respiratory failure (HCC) 11/19/2020   Adrenal insufficiency (HCC) 11/17/2020   COVID-19 virus infection 11/17/2020    History reviewed. No pertinent surgical history.  Allergies Avelox [moxifloxacin hcl in nacl], Biaxin [clarithromycin], Lactose intolerance (gi), Tobramycin, and Yeast-related products  History reviewed. No pertinent family history.  Social History Social History   Tobacco Use   Smoking status: Never   Smokeless tobacco: Never  Substance Use Topics   Alcohol use: No   Drug use: No    Review of Systems  Constitutional: No fever/chills Eyes: No visual changes. ENT: No sore throat. Cardiovascular: Positive chest pain. Respiratory: Denies shortness of breath. Gastrointestinal: No abdominal pain.  No nausea, no vomiting.  No diarrhea.   No constipation. Genitourinary: Negative for dysuria. Musculoskeletal: Negative for back pain. Skin: Negative for rash. Neurological: Negative for headaches, focal weakness or numbness.  10-point ROS otherwise negative.  ____________________________________________   PHYSICAL EXAM:  VITAL SIGNS: ED Triage Vitals  Enc Vitals Group     BP 04/01/21 1052 121/70     Pulse Rate 04/01/21 1052 67     Resp 04/01/21 1052 12     Temp 04/01/21 1052 (!) 96.6 F (35.9 C)     Temp Source 04/01/21 1052 Temporal     SpO2 04/01/21 1048 92 %    Constitutional: Alert and oriented. Well appearing and in no acute distress. Eyes: Conjunctivae are normal.  Head: Atraumatic. Nose: No congestion/rhinnorhea. Mouth/Throat: Mucous membranes are moist. Neck: No stridor.   Cardiovascular: Normal rate, regular rhythm. Good peripheral circulation. Grossly normal heart sounds.   Respiratory: Normal respiratory effort.  No retractions. Lungs CTAB. Gastrointestinal: Soft and nontender. No distention.  Musculoskeletal: No lower extremity tenderness nor edema. No gross deformities of extremities. Neurologic:  Normal speech and language. No gross focal neurologic deficits are appreciated.  Skin:  Skin is warm, dry and intact. No rash noted.  ____________________________________________   LABS (all labs ordered are listed, but only abnormal results are displayed)  Labs Reviewed  HEMOGLOBIN A1C  CBC WITH DIFFERENTIAL/PLATELET  PROTIME-INR  APTT  COMPREHENSIVE METABOLIC PANEL  LIPID PANEL  I-STAT CHEM 8, ED  TROPONIN I (HIGH SENSITIVITY)   ____________________________________________  EKG   EKG Interpretation  Date/Time:  Sunday April 01 2021 10:53:52 EST Ventricular  Rate:  76 PR Interval:  197 QRS Duration: 137 QT Interval:  440 QTC Calculation: 495 R Axis:   60 Text Interpretation: Sinus rhythm Right bundle branch block Inferior infarct, acute (RCA) Lateral leads are also involved  Probable RV involvement, suggest recording right precordial leads >>> Acute MI <<< Confirmed by Alona Bene [id number]) on 04/01/2021 10:56:56 AM        ____________________________________________  RADIOLOGY  CXR reviewed   ____________________________________________   PROCEDURES  Procedure(s) performed:   Procedures  CRITICAL CARE Performed by: Maia Plan Total critical care time: 30 minutes Critical care time was exclusive of separately billable procedures and treating other patients. Critical care was necessary to treat or prevent imminent or life-threatening deterioration. Critical care was time spent personally by me on the following activities: development of treatment plan with patient and/or surrogate as well as nursing, discussions with consultants, evaluation of patient's response to treatment, examination of patient, obtaining history from patient or surrogate, ordering and performing treatments and interventions, ordering and review of laboratory studies, ordering and review of radiographic studies, pulse oximetry and re-evaluation of patient's condition.  Alona Bene, MD Emergency Medicine  ____________________________________________   INITIAL IMPRESSION / ASSESSMENT AND PLAN / ED COURSE  Pertinent labs & imaging results that were available during my care of the patient were reviewed by me and considered in my medical decision making (see chart for details).   Patient arrives to the emergency department as a code STEMI.  She is currently pain-free.  EKG here shows ST elevations inferiorly with reciprocal changes noted as interpreted above.  Will initiate heparin.  Cardiology notified and will be down to evaluate for emergent cath.   COVID and flu negative.  Initial troponin is within normal limits.  Fattening of 1.14.  Patient taken for emergent heart cath given EKG findings.  Chest x-ray reviewed. No obvious infiltrate.   Discussed patient's case with  Cardiology to request admission. Patient and family (if present) updated with plan. Care transferred to Cardiology service.  I reviewed all nursing notes, vitals, pertinent old records, EKGs, labs, imaging (as available).  ____________________________________________  FINAL CLINICAL IMPRESSION(S) / ED DIAGNOSES  Final diagnoses:  ST elevation myocardial infarction (STEMI), unspecified artery (HCC)     MEDICATIONS GIVEN DURING THIS VISIT:  Medications  0.9 %  sodium chloride infusion (has no administration in time range)  heparin injection 4,000 Units (has no administration in time range)    Note:  This document was prepared using Dragon voice recognition software and may include unintentional dictation errors.  Alona Bene, MD, [facility] Emergency Medicine    Evetta Renner, Arlyss Repress, MD 04/03/21 [phone]

## 2021-04-01 NOTE — ED Notes (Signed)
Cardiology at bedside.

## 2021-04-01 NOTE — ED Notes (Addendum)
Pt's silver-colored ring and watch were given to husband along w/ clothing and shoes

## 2021-04-01 NOTE — H&P (Signed)
Cardiology Admission History and Physical:   Patient ID: JAZZELLE ZHANG MRN: [id number]; DOB: 06-15-1950   Admission date: 04/01/2021  PCP:  Tacy Learn, FNP   [facility] Providers Cardiologist:  None        Chief Complaint: Chest pain  Patient Profile:   Monica Harrison is a 70 y.o. female with borderline elevated lipids by previous evaluation who is being seen 04/01/2021 for the evaluation of chest pain-inferior STEMI.  History of Present Illness:   Monica Harrison was in her usual state of health until about 930 this morning she began having substernal chest discomfort rated as 8 out of 10.  When it did not go away, she contacted EMS.  When EMS arrived and on initial evaluation was found to have inferior ST elevations roughly 2 to 3 mm in 2 3 and aVF.  Code STEMI was called.  She was transported emergently to [facility] Emergency Room.  Initial evaluation with COVID swab, lab draw and basic evaluation was performed.  There was a delay in bringing the patient to the Cath Lab at roughly 10 minutes because of completion of the previous emergent case.  By EMS was given 300 mg aspirin and 4 Zofran.  Also between EMS and ER total of 10 mg IV morphine.  4000 and his IV heparin. Upon arrival to the Cath Lab she still having 7-11/2008 chest pain.   Past Medical History:  Diagnosis Date   Adrenal insufficiency (HCC)    Thyroid disease     History reviewed. No pertinent surgical history.   Medications Prior to Admission: Prior to Admission medications   Medication Sig Start Date End Date Taking? Authorizing Provider  albuterol (PROVENTIL) (2.5 MG/3ML) 0.083% nebulizer solution Take 3 mLs (2.5 mg total) by nebulization every 6 (six) hours as needed for wheezing or shortness of breath. 01/03/21   Glenford Bayley, NP  albuterol (VENTOLIN HFA) 108 (90 Base) MCG/ACT inhaler Inhale 2 puffs into the lungs every 6 (six) hours as needed for wheezing or shortness of breath. 11/16/20    Mannam, Colbert Coyer, MD  ALPRAZolam (XANAX) 0.25 MG tablet Take 0.25 mg by mouth at bedtime.    [provider]  Ascorbic Acid (VITAMIN C) 1000 MG tablet Take 2,000 mg by mouth 2 (two) times daily.    [provider]  B Complex Vitamins (B COMPLEX 50 PO) Take 1 tablet by mouth daily.     [provider]  benzonatate (TESSALON) 200 MG capsule Take 1 capsule (200 mg total) by mouth 3 (three) times daily as needed for cough. Patient not taking: Reported on 03/02/2021 11/15/20   Glenford Bayley, NP  budesonide (RHINOCORT AQUA) 32 MCG/ACT nasal spray Place 1 spray into both nostrils daily as needed for rhinitis. Patient not taking: Reported on 03/02/2021    [provider]  Calcium-Magnesium-Zinc (CAL-MAG-ZINC PO) Take 2 tablets by mouth at bedtime.    [provider]  Cholecalciferol (VITAMIN D3) 5000 units CAPS Take 15,000 Units by mouth at bedtime.    [provider]  Coenzyme Q10 (CO Q 10) 100 MG CAPS Take 100 mg by mouth every evening.     [provider]  Digestive Enzymes (ENZYME DIGEST) CAPS Take 1 capsule by mouth 2 (two) times daily with a meal.     [provider]  fluconazole (DIFLUCAN) 150 MG tablet Take 150 mg by mouth once. 02/05/21   [provider]  guaiFENesin-dextromethorphan (ROBITUSSIN DM) 100-10 MG/5ML syrup Take 10 mLs  by mouth every 4 (four) hours as needed for cough. 11/28/20   Sherryll Burger, Pratik D, DO  hydrocortisone (CORTEF) 5 MG tablet Take 5 mg by mouth See admin instructions. TAKE 10MG  IN THE MORNING AND 5 MG IN THE AFTERNOON.    [provider]  ibuprofen (ADVIL,MOTRIN) 200 MG tablet Take 400 mg by mouth every 6 (six) hours as needed for moderate pain.    [provider]  Magnesium 400 MG TABS Take 400 mg by mouth at bedtime.    [provider]  Misc Natural Products (IMMUNE FORMULA PO) Take 1 tablet by mouth 4 (four) times a week.    [provider]  Multiple Vitamin  (MULTIVITAMIN WITH MINERALS) TABS tablet Take 3 tablets by mouth daily.     [provider]  Omega-3 Fatty Acids (OMEGA 3 PO) Take 2 capsules by mouth 2 (two) times daily.    [provider]  Polyethyl Glycol-Propyl Glycol 0.4-0.3 % SOLN Place 1 drop into both eyes 4 (four) times daily.     [provider]  predniSONE (DELTASONE) 10 MG tablet prednisone 10 mg tablet Patient not taking: Reported on 03/02/2021    [provider]  Probiotic Product (PROBIOTIC PO) Take 1 capsule by mouth every evening.     [provider]  thyroid (ARMOUR) 90 MG tablet Takes 35 mg and 45 mg at bedtime.    [provider]  vitamin A 03/04/2021 UNIT capsule Take 10,000 Units by mouth every evening.     [provider]     Allergies:    Allergies  Allergen Reactions   Avelox [Moxifloxacin Hcl In Nacl] Anaphylaxis   Biaxin [Clarithromycin] Anaphylaxis   Lactose Intolerance (Gi) Other (See Comments)    Bloating, gas, congestion   Tobramycin Other (See Comments)    Severe headaches    Yeast-Related Products Swelling    Tongue swelling /candida    Social History:   Social History   Socioeconomic History   Marital status: Married    Spouse name: Not on file   Number of children: Not on file   Years of education: Not on file   Highest education level: Not on file  Occupational History   Not on file  Tobacco Use   Smoking status: Never   Smokeless tobacco: Never  Substance and Sexual Activity   Alcohol use: No   Drug use: No   Sexual activity: Not on file  Other Topics Concern   Not on file  Social History Narrative   Not on file   Social Determinants of Health   Financial Resource Strain: Not on file  Food Insecurity: Not on file  Transportation Needs: Not on file  Physical Activity: Not on file  Stress: Not on file  Social Connections: Not on file  Intimate Partner Violence: Not on file    Family History:   The patient's family  history is not on file.  She she is not aware of any pertinent cardiac history in her family.  ROS:  Please see the history of present illness.   Chest pain associated with shortness of breath and nausea but no emesis.  Otherwise has been doing well with no antecedent symptoms.  All other ROS reviewed and negative.     Physical Exam/Data:   Vitals:   04/01/21 1048 04/01/21 1052 04/01/21 1059 04/01/21 1100  BP:  121/70  125/75  Pulse:  67  78  Resp:  12  15  Temp:  (!) 96.6  F (35.9 C)    TempSrc:  Temporal    SpO2: 92% 93%  95%  Weight:   83.1 kg   Height:   5\' 3"  (1.6 m)    No intake or output data in the 24 hours ending 04/01/21 1123 Last 3 Weights 04/01/2021 03/20/2021 03/02/2021  Weight (lbs) 183 lb 3.2 oz 183 lb 3.2 oz 184 lb 6.4 oz  Weight (kg) 83.1 kg 83.1 kg 83.643 kg     Body mass index is 32.45 kg/m.  General:  Well nourished, well developed, in moderate acute distress HEENT: normal Neck: no notable JVD Vascular: No carotid bruits; Distal pulses 2+ bilaterally   Cardiac:  normal S1, S2; RRR; no murmur, rubs or gallops Lungs:  clear to auscultation bilaterally, no wheezing, rhonchi or rales  Abd: soft, nontender, no hepatomegaly  Ext: no clubbing/cyanosis or edema Musculoskeletal:  No deformities, BUE and BLE strength normal and equal Skin: warm and dry  Neuro:  CNs 2-12 intact, no focal abnormalities noted Psych:  Normal affect    EKG:  The ECG that was done Upon arrival to ER was personally reviewed and demonstrates sinus rhythm, rate 76 bpm.  3 to 4 mm elevations in 2 3 and aVF with roughly 2 mm elevations in V4 through V6.  Relevant CV Studies: None  Laboratory Data:  High Sensitivity Troponin:  No results for input(s): TROPONINIHS in the last 720 hours.    Chemistry Recent Labs  Lab 04/01/21 1101  NA 139  K 4.2  CL 106  GLUCOSE 180*  BUN 22  CREATININE 0.60    No results for input(s): PROT, ALBUMIN, AST, ALT, ALKPHOS, BILITOT in the last 168  hours. Lipids No results for input(s): CHOL, TRIG, HDL, LABVLDL, LDLCALC, CHOLHDL in the last 168 hours. Hematology Recent Labs  Lab 04/01/21 1053 04/01/21 1101  WBC 9.9  --   RBC 4.17  --   HGB 12.6 12.6  HCT 37.3 37.0  MCV 89.4  --   MCH 30.2  --   MCHC 33.8  --   RDW 12.8  --   PLT 278  --    Thyroid No results for input(s): TSH, FREET4 in the last 168 hours. BNPNo results for input(s): BNP, PROBNP in the last 168 hours.  DDimer No results for input(s): DDIMER in the last 168 hours.   Radiology/Studies:  No results found.   Assessment and Plan:   Acute inferolateral STEMI with 8/10 chest pain.. Emergent cardiac catheterization with possible PCI. 2D echo in the morning. Standard CAD monitoring with morning FLP -> high-dose high intensity statin, beta-blocker if tolerated   Risk Assessment/Risk Scores:    TIMI Risk Score for ST  Elevation MI:   The patient's TIMI risk score is 2, which indicates a 2.2% risk of all cause mortality at 30 days.        Severity of Illness: The appropriate patient status for this patient is INPATIENT. Inpatient status is judged to be reasonable and necessary in order to provide the required intensity of service to ensure the patient's safety. The patient's presenting symptoms, physical exam findings, and initial radiographic and laboratory data in the context of their chronic comorbidities is felt to place them at high risk for further clinical deterioration. Furthermore, it is not anticipated that the patient will be medically stable for discharge from the hospital within 2 midnights of admission.   * I certify that at the point of admission it is my clinical judgment that the  patient will require inpatient hospital care spanning beyond 2 midnights from the point of admission due to high intensity of service, high risk for further deterioration and high frequency of surveillance required.*   For questions or updates, please contact [facility] Please consult [email or web address] for contact info under     Signed, Bryan Lemma, MD  04/01/2021 11:23 AM

## 2021-04-02 ENCOUNTER — Inpatient Hospital Stay (HOSPITAL_COMMUNITY): Payer: Medicare Other

## 2021-04-02 ENCOUNTER — Other Ambulatory Visit (HOSPITAL_COMMUNITY): Payer: Self-pay

## 2021-04-02 ENCOUNTER — Encounter (HOSPITAL_COMMUNITY): Payer: Self-pay | Admitting: Cardiology

## 2021-04-02 DIAGNOSIS — I2119 ST elevation (STEMI) myocardial infarction involving other coronary artery of inferior wall: Secondary | ICD-10-CM

## 2021-04-02 HISTORY — PX: TRANSTHORACIC ECHOCARDIOGRAM: SHX275

## 2021-04-02 LAB — BASIC METABOLIC PANEL
Anion gap: 9 (ref 5–15)
BUN: 18 mg/dL (ref 8–23)
CO2: 21 mmol/L — ABNORMAL LOW (ref 22–32)
Calcium: 9.4 mg/dL (ref 8.9–10.3)
Chloride: 105 mmol/L (ref 98–111)
Creatinine, Ser: 0.8 mg/dL (ref 0.44–1.00)
GFR, Estimated: >60 mL/min (ref >60)
Glucose, Bld: 137 mg/dL — ABNORMAL HIGH (ref 70–99)
Potassium: 4.4 mmol/L (ref 3.5–5.1)
Sodium: 135 mmol/L (ref 135–145)

## 2021-04-02 LAB — ECHOCARDIOGRAM COMPLETE
AR max vel: 2.25 cm2
AV Area VTI: 2.24 cm2
AV Area mean vel: 2.13 cm2
AV Mean grad: 3 mmHg
AV Peak grad: 7 mmHg
Ao pk vel: 1.32 m/s
Area-P 1/2: 3.61 cm2
Height: 63 in
MV VTI: 1.54 cm2
S' Lateral: 2.5 cm
Weight: 2931.24 oz

## 2021-04-02 LAB — CBC
HCT: 35.2 % — ABNORMAL LOW (ref 36.0–46.0)
Hemoglobin: 12 g/dL (ref 12.0–15.0)
MCH: 30.3 pg (ref 26.0–34.0)
MCHC: 34.1 g/dL (ref 30.0–36.0)
MCV: 88.9 fL (ref 80.0–100.0)
Platelets: 223 10*3/uL (ref 150–400)
RBC: 3.96 MIL/uL (ref 3.87–5.11)
RDW: 13.1 % (ref 11.5–15.5)
WBC: 12 10*3/uL — ABNORMAL HIGH (ref 4.0–10.5)
nRBC: 0 % (ref 0.0–0.2)

## 2021-04-02 MED ORDER — THYROID 30 MG PO TABS
30.0000 mg | ORAL_TABLET | Freq: Every evening | ORAL | Status: DC
  Administered 2021-04-02: 17:00:00 30 mg via ORAL
  Filled 2021-04-02 (×2): qty 1

## 2021-04-02 MED ORDER — THYROID 30 MG PO TABS
45.0000 mg | ORAL_TABLET | Freq: Every day | ORAL | Status: DC
  Administered 2021-04-02 – 2021-04-03 (×2): 45 mg via ORAL
  Filled 2021-04-02 (×3): qty 2

## 2021-04-02 MED ORDER — ATORVASTATIN CALCIUM 80 MG PO TABS
80.0000 mg | ORAL_TABLET | Freq: Every day | ORAL | Status: DC
  Administered 2021-04-02 – 2021-04-03 (×2): 80 mg via ORAL
  Filled 2021-04-02 (×2): qty 1

## 2021-04-02 NOTE — Progress Notes (Signed)
CARDIAC REHAB PHASE I   PRE:  Rate/Rhythm: 54 SB  BP:  Sitting: 108/70      SaO2: 96 RA  MODE:  Ambulation: 270 ft   POST:  Rate/Rhythm: 68 SR  BP:  Sitting: 118/103    SaO2: 97 RA   Pt ambulated 211ft in hallway standby assist with steady gait. Pt c/o knee pain and mild SOB. Denies CP or dizziness. Pt educated on importance of ASA, Brilinta, statin, and NTG. Pt given stent card, MI Book, and heart healthy diet. Reviewed site care, restrictions, and exercise guidelines. Pt denies further questions or concerns at this time. Will continue to follow.  [id number] Reynold Bowen, RN BSN 04/02/2021 11:34 AM

## 2021-04-02 NOTE — TOC Benefit Eligibility Note (Signed)
Patient Advocate Encounter ° °Insurance verification completed.   ° °The patient is currently admitted and upon discharge could be taking Brilinta 90 mg. ° °The current 30 day co-pay is, $47.00.  ° °The patient is insured through [employer] Part D  ° ° ° °Ansar Skoda, CPhT °Pharmacy Patient Advocate Specialist °[facility] Patient Advocate Team °Direct Number: [phone]  Fax: [phone] ° ° ° ° ° °  °

## 2021-04-02 NOTE — Progress Notes (Signed)
Progress Note  Patient Name: Monica Harrison Date of Encounter: 04/02/2021  [facility] Cardiologist: Dr. Herbie Baltimore  Subjective   No chest pain or shortness of breath today.  Postop day 1 inferior STEMI.  Inpatient Medications    Scheduled Meds:  ALPRAZolam  0.25 mg Oral QHS   vitamin C  2,000 mg Oral BID   aspirin  81 mg Oral Daily   atorvastatin  80 mg Oral Daily   Chlorhexidine Gluconate Cloth  6 each Topical Daily   fluticasone  2 spray Each Nare Daily   hydrocortisone  10 mg Oral Q breakfast   And   hydrocortisone  5 mg Oral Q1400   lidocaine  1 patch Transdermal Q24H   omega-3 acid ethyl esters  1 g Oral BID   polyvinyl alcohol  1 drop Both Eyes QID   sodium chloride flush  3 mL Intravenous Q12H   thyroid  45 mg Oral QAC breakfast   And   thyroid  30 mg Oral QPM   ticagrelor  90 mg Oral BID   Continuous Infusions:  sodium chloride 20 mL/hr at 04/01/21 1101   sodium chloride     norepinephrine (LEVOPHED) Adult infusion Stopped (04/01/21 1331)   PRN Meds: sodium chloride, acetaminophen, albuterol, benzonatate, morphine injection, ondansetron (ZOFRAN) IV, polyethylene glycol, sodium chloride flush   Vital Signs    Vitals:   04/02/21 0400 04/02/21 0500 04/02/21 0600 04/02/21 0700  BP: (!) 107/48 (!) 113/56 (!) 104/58 110/61  Pulse: (!) 42 (!) 44 (!) 40 (!) 49  Resp: 14 14 13 16   Temp:      TempSrc:      SpO2: 96% 95% 97% 97%  Weight:      Height:        Intake/Output Summary (Last 24 hours) at 04/02/2021 0756 Last data filed at 04/02/2021 0600 Gross per 24 hour  Intake 2436.13 ml  Output --  Net 2436.13 ml   Last 3 Weights 04/01/2021 03/20/2021 03/02/2021  Weight (lbs) 183 lb 3.2 oz 183 lb 3.2 oz 184 lb 6.4 oz  Weight (kg) 83.1 kg 83.1 kg 83.643 kg      Telemetry    Sinus rhythm/sinus bradycardia- Personally Reviewed  ECG    Junctional bradycardia at 43 with incomplete right bundle branch block- Personally Reviewed  Physical Exam    GEN: No acute distress.   Neck: No JVD Cardiac: RRR, no murmurs, rubs, or gallops.  Respiratory: Clear to auscultation bilaterally. GI: Soft, nontender, non-distended  MS: No edema; No deformity. Neuro:  Nonfocal  Psych: Normal affect   Labs    High Sensitivity Troponin:   Recent Labs  Lab 04/01/21 1053 04/01/21 1306  TROPONINIHS 5 289*     Chemistry Recent Labs  Lab 04/01/21 1053 04/01/21 1101 04/02/21 0527  NA 138 139 135  K 4.2 4.2 4.4  CL 106 106 105  CO2 23  --  21*  GLUCOSE 180* 180* 137*  BUN 17 22 18   CREATININE 0.69 0.60 0.80  CALCIUM 9.4  --  9.4  PROT 6.7  --   --   ALBUMIN 3.5  --   --   AST 37  --   --   ALT 29  --   --   ALKPHOS 47  --   --   BILITOT 0.6  --   --   GFRNONAA >60  --  >60  ANIONGAP 9  --  9    Lipids  Recent Labs  Lab 04/01/21 1053  CHOL 224*  TRIG 157*  HDL 42  LDLCALC 151*  CHOLHDL 5.3    Hematology Recent Labs  Lab 04/01/21 1053 04/01/21 1101 04/02/21 0035  WBC 9.9  --  12.0*  RBC 4.17  --  3.96  HGB 12.6 12.6 12.0  HCT 37.3 37.0 35.2*  MCV 89.4  --  88.9  MCH 30.2  --  30.3  MCHC 33.8  --  34.1  RDW 12.8  --  13.1  PLT 278  --  223   Thyroid No results for input(s): TSH, FREET4 in the last 168 hours.  BNPNo results for input(s): BNP, PROBNP in the last 168 hours.  DDimer No results for input(s): DDIMER in the last 168 hours.   Radiology    CARDIAC CATHETERIZATION  Result Date: 04/01/2021   Prox RCA to Mid RCA lesion is 100% stenosed.   A drug-eluting stent was successfully placed using a STENT ONYX FRONTIER 2.75X30 --> postdilated to 3.1 mm.   Post intervention, there is a 0% residual stenosis.   ----------------------------------------------   Prox Cx lesion is 10% stenosed.  Mid Cx lesion is 20% stenosed.   ----------------------------------------------   The left ventricular systolic function is normal.  The left ventricular ejection fraction is 55-65% by visual estimate.   LV end diastolic pressure is  normal.   There is no aortic valve stenosis. 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 Preserved LVEF Borderline hypotension requiring Levophed infusion. PLAN OF CARE: Admit to inpatient  -> loaded with Brilinta upon waking up in the CCU.  Then stop Kengreal infusion.  Check 2D echocardiogram  High-dose high density statin  Low-dose beta-blocker --> If hemodynamically stable on day 2, anticipate fast-track discharge Bryan Lemma, MD  DG Chest Portable 1 View  Result Date: 04/01/2021 CLINICAL DATA:  Status post left heart catheterization and coronary angiography. Chest pain. EXAM: PORTABLE CHEST 1 VIEW COMPARISON:  January 03, 2021 FINDINGS: Mild infiltrate in the periphery of the lungs has improved since September 2022 but does persist. The cardiomediastinal silhouette is stable given portable technique. No pneumothorax. Mild edema/pulmonary venous congestion not excluded. No other acute abnormalities. IMPRESSION: 1. Improving peripheral infiltrates compared to September 2022. 2. Mild pulmonary venous congestion/edema not excluded. 3. No other acute abnormalities. Electronically Signed   By: Gerome Sam III M.D.   On: 04/01/2021 13:36    Cardiac Studies   Cardiac catheterization/PCI and drug-eluting stent (04/01/2021)  Conclusion      Prox RCA to Mid RCA lesion is 100% stenosed.   A drug-eluting stent was successfully placed using a STENT ONYX FRONTIER 2.75X30 --> postdilated to 3.1 mm.   Post intervention, there is a 0% residual stenosis.   ----------------------------------------------   Prox Cx lesion is 10% stenosed.  Mid Cx lesion is 20% stenosed.   ----------------------------------------------   The left ventricular systolic function is normal.  The left ventricular ejection fraction is 55-65% by visual estimate.   LV end diastolic pressure  is normal.   There is no aortic valve stenosis.   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 Preserved LVEF Borderline hypotension requiring Levophed infusion.     PLAN OF CARE: Admit to inpatient  -> loaded with  Brilinta upon waking up in the CCU.  Then stop Kengreal infusion.            Check 2D echocardiogram            High-dose high density statin            Low-dose beta-blocker   --> If hemodynamically stable on day 2, anticipate fast-track discharge   Bryan Lemma, MD      SUMMARY Severe single-vessel CAD with 100% thrombotic occlusion of the proximal RCA Successful DES PCI of the proximal RCA using 2.75 mm x 30 mm DES stent postdilated to 3.1 mm. Preserved LVEF Borderline hypotension requiring Levophed infusion.     PLAN OF CARE: Admit to inpatient  -> loaded with Brilinta upon waking up in the CCU.  Then stop Kengreal infusion.            Check 2D echocardiogram            High-dose high density statin            Low-dose beta-blocker   --> If hemodynamically stable on day 2, anticipate fast-track discharge   Bryan Lemma, MD     Recommendations  Clinical Presentation  CHF/Shock Congestive heart failure not present. No shock present. Borderline hypotensive, required Levophed during the procedure both pre and post PCI, but was easily weaned upon arrival to the CVICU.Marland Kitchen   Procedural Details   Wall Motion              Left Heart  Left Ventricle The left ventricular size is normal. The left ventricular systolic function is normal. LV end diastolic pressure is normal. The left ventricular ejection fraction is 55-65% by visual estimate. There are LV function abnormalities due to segmental dysfunction.  Aortic Valve There is no aortic valve stenosis.   Coronary Diagrams   Diagnostic Dominance:  Right    Intervention      Patient Profile     70 y.o. female without prior history of CAD who [place] in church with chest pain.  She was transported urgently to [facility] where she underwent diagnostic coronary angiography by Dr. Bryan Lemma demonstrating an occluded RCA which she successfully stented.  Assessment & Plan    1: Coronary artery disease-postop day 1 inferior STEMI treated with PCI drug-eluting stenting of the RCA.  The remainder of her coronary anatomy was free of disease.  Her EF was preserved.  She is on dual antiplatelet therapy including aspirin and Brilinta.  2: Hyperlipidemia-high-dose statin therapy initiated  3: Sinus bradycardia/junctional bradycardia-patient's heart rate was in the 40s and was occasionally junctional.  This morning she is in sinus rhythm in the 50s.  We will hold off on adding a beta-blocker at this time and will continue to monitor.  Transfer to telemetry, cardiac rehab, consider fast-track discharge tomorrow if remains clinically stable.  For questions or updates, please contact [facility] Please consult [email or web address] for contact info under        Signed, Nanetta Batty, MD  04/02/2021, 7:56 AM

## 2021-04-02 NOTE — Progress Notes (Signed)
°  Echocardiogram 2D Echocardiogram has been performed.  Gerda Diss 04/02/2021, 9:14 AM

## 2021-04-02 NOTE — Progress Notes (Signed)
°  Transition of Care [facility]) Screening Note   Patient Details  Name: Monica Harrison Date of Birth: 05/19/1950   Transition of Care [facility] CM/SW Contact:    Delilah Shan, LCSWA Phone Number: 04/02/2021, 4:44 PM    Transition of Care Department [facility]) has reviewed patient and no TOC needs have been identified at this time. We will continue to monitor patient advancement through interdisciplinary progression rounds. If new patient transition needs arise, please place a TOC consult.

## 2021-04-02 NOTE — Discharge Instructions (Signed)

## 2021-04-02 NOTE — TOC Benefit Eligibility Note (Signed)
Patient Product/process development scientist completed.    The patient is currently admitted and upon discharge could be taking Vascepa 1 g.  The current 30 day co-pay is, $193.51.   The patient is insured through [employer] Part D     Roland Earl, CPhT Pharmacy Patient Advocate Specialist [facility] Patient Advocate Team Direct Number: [phone]  Fax: [phone]

## 2021-04-03 ENCOUNTER — Other Ambulatory Visit: Payer: Self-pay | Admitting: Cardiology

## 2021-04-03 ENCOUNTER — Inpatient Hospital Stay (INDEPENDENT_AMBULATORY_CARE_PROVIDER_SITE_OTHER): Payer: Medicare Other

## 2021-04-03 ENCOUNTER — Encounter (HOSPITAL_COMMUNITY): Payer: Self-pay | Admitting: Cardiology

## 2021-04-03 ENCOUNTER — Other Ambulatory Visit (HOSPITAL_COMMUNITY): Payer: Self-pay

## 2021-04-03 ENCOUNTER — Encounter (HOSPITAL_COMMUNITY): Payer: Medicare Other

## 2021-04-03 ENCOUNTER — Telehealth: Payer: Self-pay | Admitting: Cardiology

## 2021-04-03 DIAGNOSIS — E785 Hyperlipidemia, unspecified: Secondary | ICD-10-CM

## 2021-04-03 DIAGNOSIS — R7303 Prediabetes: Secondary | ICD-10-CM

## 2021-04-03 DIAGNOSIS — R001 Bradycardia, unspecified: Secondary | ICD-10-CM

## 2021-04-03 DIAGNOSIS — E118 Type 2 diabetes mellitus with unspecified complications: Secondary | ICD-10-CM

## 2021-04-03 MED ORDER — ATORVASTATIN CALCIUM 80 MG PO TABS
80.0000 mg | ORAL_TABLET | Freq: Every day | ORAL | 1 refills | Status: DC
  Filled 2021-04-03: qty 30, 30d supply, fill #0

## 2021-04-03 MED ORDER — NITROGLYCERIN 0.4 MG SL SUBL
0.4000 mg | SUBLINGUAL_TABLET | SUBLINGUAL | 2 refills | Status: DC | PRN
  Filled 2021-04-03: qty 25, 7d supply, fill #0

## 2021-04-03 MED ORDER — ASPIRIN 81 MG PO CHEW
81.0000 mg | CHEWABLE_TABLET | Freq: Every day | ORAL | 2 refills | Status: DC
  Filled 2021-04-03: qty 90, 90d supply, fill #0

## 2021-04-03 MED ORDER — TICAGRELOR 90 MG PO TABS
90.0000 mg | ORAL_TABLET | Freq: Two times a day (BID) | ORAL | 2 refills | Status: DC
  Filled 2021-04-03: qty 60, 30d supply, fill #0

## 2021-04-03 NOTE — Progress Notes (Unsigned)
Enrolled patient for a 7 day Zio XT monitor to be mailed to patients home  Dr Harding to read 

## 2021-04-03 NOTE — Progress Notes (Signed)
Progress Note  Patient Name: Monica Harrison Date of Encounter: 04/03/2021  [facility] Cardiologist: Dr. Herbie Baltimore  Subjective   No chest pain or shortness of breath today.  Postop day 2 inferior STEMI.  Inpatient Medications    Scheduled Meds:  ALPRAZolam  0.25 mg Oral QHS   vitamin C  2,000 mg Oral BID   aspirin  81 mg Oral Daily   atorvastatin  80 mg Oral Daily   Chlorhexidine Gluconate Cloth  6 each Topical Daily   fluticasone  2 spray Each Nare Daily   hydrocortisone  10 mg Oral Q breakfast   And   hydrocortisone  5 mg Oral Q1400   lidocaine  1 patch Transdermal Q24H   omega-3 acid ethyl esters  1 g Oral BID   polyvinyl alcohol  1 drop Both Eyes QID   sodium chloride flush  3 mL Intravenous Q12H   thyroid  45 mg Oral QAC breakfast   And   thyroid  30 mg Oral QPM   ticagrelor  90 mg Oral BID   Continuous Infusions:  sodium chloride 20 mL/hr at 04/01/21 1101   sodium chloride     norepinephrine (LEVOPHED) Adult infusion Stopped (04/01/21 1331)   PRN Meds: sodium chloride, acetaminophen, albuterol, benzonatate, morphine injection, ondansetron (ZOFRAN) IV, polyethylene glycol, sodium chloride flush   Vital Signs    Vitals:   04/02/21 1900 04/02/21 2044 04/03/21 0551 04/03/21 1013  BP: (!) 111/55 117/61 (!) 123/52   Pulse: (!) 44 (!) 54 (!) 46   Resp: 16 17 18    Temp:  97.9 F (36.6 C) 98.3 F (36.8 C)   TempSrc:  Oral Oral   SpO2: 93% 96% 98% 100%  Weight:  82.8 kg    Height:  5\' 3"  (1.6 m)     No intake or output data in the 24 hours ending 04/03/21 1101  Last 3 Weights 04/02/2021 04/01/2021 03/20/2021  Weight (lbs) 182 lb 8.7 oz 183 lb 3.2 oz 183 lb 3.2 oz  Weight (kg) 82.8 kg 83.1 kg 83.1 kg      Telemetry    Sinus rhythm/sinus bradycardia- Personally Reviewed  ECG    Junctional bradycardia at 43 with incomplete right bundle branch block- Personally Reviewed  Physical Exam   GEN: No acute distress.   Neck: No JVD Cardiac: RRR, no  murmurs, rubs, or gallops.  Respiratory: Clear to auscultation bilaterally. GI: Soft, nontender, non-distended  MS: No edema; No deformity. Neuro:  Nonfocal  Psych: Normal affect   Labs    High Sensitivity Troponin:   Recent Labs  Lab 04/01/21 1053 04/01/21 1306  TROPONINIHS 5 289*     Chemistry Recent Labs  Lab 04/01/21 1053 04/01/21 1101 04/02/21 0527  NA 138 139 135  K 4.2 4.2 4.4  CL 106 106 105  CO2 23  --  21*  GLUCOSE 180* 180* 137*  BUN 17 22 18   CREATININE 0.69 0.60 0.80  CALCIUM 9.4  --  9.4  PROT 6.7  --   --   ALBUMIN 3.5  --   --   AST 37  --   --   ALT 29  --   --   ALKPHOS 47  --   --   BILITOT 0.6  --   --   GFRNONAA >60  --  >60  ANIONGAP 9  --  9    Lipids  Recent Labs  Lab 04/01/21 1053  CHOL 224*  TRIG 157*  HDL  42  LDLCALC 151*  CHOLHDL 5.3    Hematology Recent Labs  Lab 04/01/21 1053 04/01/21 1101 04/02/21 0035  WBC 9.9  --  12.0*  RBC 4.17  --  3.96  HGB 12.6 12.6 12.0  HCT 37.3 37.0 35.2*  MCV 89.4  --  88.9  MCH 30.2  --  30.3  MCHC 33.8  --  34.1  RDW 12.8  --  13.1  PLT 278  --  223   Thyroid No results for input(s): TSH, FREET4 in the last 168 hours.  BNPNo results for input(s): BNP, PROBNP in the last 168 hours.  DDimer No results for input(s): DDIMER in the last 168 hours.   Radiology    CARDIAC CATHETERIZATION  Result Date: 04/01/2021   Prox RCA to Mid RCA lesion is 100% stenosed.   A drug-eluting stent was successfully placed using a STENT ONYX FRONTIER 2.75X30 --> postdilated to 3.1 mm.   Post intervention, there is a 0% residual stenosis.   ----------------------------------------------   Prox Cx lesion is 10% stenosed.  Mid Cx lesion is 20% stenosed.   ----------------------------------------------   The left ventricular systolic function is normal.  The left ventricular ejection fraction is 55-65% by visual estimate.   LV end diastolic pressure is normal.   There is no aortic valve stenosis. 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 Preserved LVEF Borderline hypotension requiring Levophed infusion. PLAN OF CARE: Admit to inpatient  -> loaded with Brilinta upon waking up in the CCU.  Then stop Kengreal infusion.  Check 2D echocardiogram  High-dose high density statin  Low-dose beta-blocker --> If hemodynamically stable on day 2, anticipate fast-track discharge Bryan Lemma, MD  DG Chest Portable 1 View  Result Date: 04/01/2021 CLINICAL DATA:  Status post left heart catheterization and coronary angiography. Chest pain. EXAM: PORTABLE CHEST 1 VIEW COMPARISON:  January 03, 2021 FINDINGS: Mild infiltrate in the periphery of the lungs has improved since September 2022 but does persist. The cardiomediastinal silhouette is stable given portable technique. No pneumothorax. Mild edema/pulmonary venous congestion not excluded. No other acute abnormalities. IMPRESSION: 1. Improving peripheral infiltrates compared to September 2022. 2. Mild pulmonary venous congestion/edema not excluded. 3. No other acute abnormalities. Electronically Signed   By: Gerome Sam III M.D.   On: 04/01/2021 13:36   ECHOCARDIOGRAM COMPLETE  Result Date: 04/02/2021    ECHOCARDIOGRAM REPORT   Patient Name:   Monica Harrison Date of Exam: 04/02/2021 Medical Rec #:  [id number]        Height:       63.0 in Accession #:    [id number]       Weight:       183.2 lb Date of Birth:  [birth year]         BSA:          1.863 m Patient Age:    70 years         BP:           113/68 mmHg Patient Gender: F                HR:           49 bpm. Exam Location:  Inpatient Procedure: 2D Echo, Cardiac Doppler and Color Doppler Indications:    Myocardial infarct  CAD Native Vessel  History:        Patient has prior history of Echocardiogram examinations, most                 recent 02/16/2021. Acute MI. S/P  PCI.  Sonographer:    Ross Ludwig RDCS (AE) Referring Phys: [id number] DAVID W HARDING IMPRESSIONS  1. Left ventricular ejection fraction, by estimation, is 65 to 70%. The left ventricle has normal function. The left ventricle has no regional wall motion abnormalities. There is moderate asymmetric left ventricular hypertrophy of the basal-septal segment. Left ventricular diastolic parameters are indeterminate.  2. Right ventricular systolic function is mildly reduced. The right ventricular size is normal. Tricuspid regurgitation signal is inadequate for assessing PA pressure.  3. Right atrial size was mildly dilated.  4. The mitral valve is normal in structure. No evidence of mitral valve regurgitation. No evidence of mitral stenosis.  5. The aortic valve is tricuspid. Aortic valve regurgitation is not visualized. No aortic stenosis is present.  6. The inferior vena cava is dilated in size with <50% respiratory variability, suggesting right atrial pressure of 15 mmHg. FINDINGS  Left Ventricle: Left ventricular ejection fraction, by estimation, is 65 to 70%. The left ventricle has normal function. The left ventricle has no regional wall motion abnormalities. The left ventricular internal cavity size was normal in size. There is  moderate asymmetric left ventricular hypertrophy of the basal-septal segment. Left ventricular diastolic parameters are indeterminate. Right Ventricle: The right ventricular size is normal. Right vetricular wall thickness was not well visualized. Right ventricular systolic function is mildly reduced. Tricuspid regurgitation signal is inadequate for assessing PA pressure. Left Atrium: Left atrial size was normal in size. Right Atrium: Right atrial size was mildly dilated. Pericardium: Trivial pericardial effusion is present. Presence of epicardial fat layer. Mitral Valve: The mitral valve is normal in structure. No evidence of mitral valve regurgitation. No evidence of mitral valve stenosis. MV peak  gradient, 4.8 mmHg. The mean mitral valve gradient is 1.0 mmHg. Tricuspid Valve: The tricuspid valve is normal in structure. Tricuspid valve regurgitation is trivial. Aortic Valve: The aortic valve is tricuspid. Aortic valve regurgitation is not visualized. No aortic stenosis is present. Aortic valve mean gradient measures 3.0 mmHg. Aortic valve peak gradient measures 7.0 mmHg. Aortic valve area, by VTI measures 2.24 cm. Pulmonic Valve: The pulmonic valve was not well visualized. Pulmonic valve regurgitation is not visualized. Aorta: The aortic root and ascending aorta are structurally normal, with no evidence of dilitation. Venous: The inferior vena cava is dilated in size with less than 50% respiratory variability, suggesting right atrial pressure of 15 mmHg. IAS/Shunts: The interatrial septum was not well visualized.  LEFT VENTRICLE PLAX 2D LVIDd:         4.10 cm   Diastology LVIDs:         2.50 cm   LV e' medial:    6.92 cm/s LV PW:         1.10 cm   LV E/e' medial:  15.8 LV IVS:        1.50 cm   LV e' lateral:   9.02 cm/s LVOT diam:     2.00 cm   LV E/e' lateral: 12.1 LV SV:         57 LV SV Index:   30 LVOT Area:     3.14 cm  RIGHT VENTRICLE            IVC RV Basal diam:  3.50 cm  IVC diam: 2.20 cm RV S prime:     9.20 cm/s LEFT ATRIUM             Index        RIGHT ATRIUM           Index LA diam:        3.50 cm 1.88 cm/m   RA Area:     17.60 cm LA Vol (A2C):   35.4 ml 19.00 ml/m  RA Volume:   56.10 ml  30.11 ml/m LA Vol (A4C):   28.0 ml 15.03 ml/m LA Biplane Vol: 31.3 ml 16.80 ml/m  AORTIC VALVE AV Area (Vmax):    2.25 cm AV Area (Vmean):   2.13 cm AV Area (VTI):     2.24 cm AV Vmax:           132.00 cm/s AV Vmean:          80.700 cm/s AV VTI:            0.253 m AV Peak Grad:      7.0 mmHg AV Mean Grad:      3.0 mmHg LVOT Vmax:         94.60 cm/s LVOT Vmean:        54.700 cm/s LVOT VTI:          0.180 m LVOT/AV VTI ratio: 0.71  AORTA Ao Root diam: 3.50 cm Ao Asc diam:  3.40 cm MITRAL VALVE MV  Area (PHT): 3.61 cm     SHUNTS MV Area VTI:   1.54 cm     Systemic VTI:  0.18 m MV Peak grad:  4.8 mmHg     Systemic Diam: 2.00 cm MV Mean grad:  1.0 mmHg MV Vmax:       1.10 m/s MV Vmean:      45.2 cm/s MV Decel Time: 210 msec MV E velocity: 109.00 cm/s Epifanio Lesches MD Electronically signed by Epifanio Lesches MD Signature Date/Time: 04/02/2021/10:41:04 AM    Final     Cardiac Studies   Cardiac catheterization/PCI and drug-eluting stent (04/01/2021)  Conclusion      Prox RCA to Mid RCA lesion is 100% stenosed.   A drug-eluting stent was successfully placed using a STENT ONYX FRONTIER 2.75X30 --> postdilated to 3.1 mm.   Post intervention, there is a 0% residual stenosis.   ----------------------------------------------   Prox Cx lesion is 10% stenosed.  Mid Cx lesion is 20% stenosed.   ----------------------------------------------   The left ventricular systolic function is normal.  The left ventricular ejection fraction is 55-65% by visual estimate.   LV end diastolic pressure is normal.   There is no aortic valve stenosis.   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 Preserved LVEF Borderline hypotension requiring Levophed infusion.     PLAN OF CARE: Admit to inpatient  -> loaded with Brilinta upon waking up in the CCU.  Then stop Kengreal infusion.            Check 2D echocardiogram            High-dose high density statin            Low-dose beta-blocker   --> If hemodynamically stable on day 2, anticipate fast-track discharge   Bryan Lemma, MD      SUMMARY Severe single-vessel CAD with 100% thrombotic occlusion of the proximal  RCA Successful DES PCI of the proximal RCA using 2.75 mm x 30 mm DES stent postdilated to 3.1 mm. Preserved LVEF Borderline hypotension requiring Levophed infusion.     PLAN OF CARE:  Admit to inpatient  -> loaded with Brilinta upon waking up in the CCU.  Then stop Kengreal infusion.            Check 2D echocardiogram            High-dose high density statin            Low-dose beta-blocker   --> If hemodynamically stable on day 2, anticipate fast-track discharge   Bryan Lemma, MD     Recommendations  Clinical Presentation  CHF/Shock Congestive heart failure not present. No shock present. Borderline hypotensive, required Levophed during the procedure both pre and post PCI, but was easily weaned upon arrival to the CVICU.Marland Kitchen   Procedural Details   Wall Motion              Left Heart  Left Ventricle The left ventricular size is normal. The left ventricular systolic function is normal. LV end diastolic pressure is normal. The left ventricular ejection fraction is 55-65% by visual estimate. There are LV function abnormalities due to segmental dysfunction.  Aortic Valve There is no aortic valve stenosis.   Coronary Diagrams   Diagnostic Dominance: Right    Intervention     2D echocardiogram (04/01/2021)   IMPRESSIONS     1. Left ventricular ejection fraction, by estimation, is 65 to 70%. The  left ventricle has normal function. The left ventricle has no regional  wall motion abnormalities. There is moderate asymmetric left ventricular  hypertrophy of the basal-septal  segment. Left ventricular diastolic parameters are indeterminate.   2. Right ventricular systolic function is mildly reduced. The right  ventricular size is normal. Tricuspid regurgitation signal is inadequate  for assessing PA pressure.   3. Right atrial size was mildly dilated.   4. The mitral valve is normal in structure. No evidence of mitral valve  regurgitation. No evidence of mitral stenosis.   5. The aortic valve is tricuspid. Aortic valve regurgitation is not  visualized. No aortic stenosis is present.   6. The inferior vena cava is dilated in size with <50%  respiratory  variability, suggesting right atrial pressure of 15 mmHg.    Patient Profile     70 y.o. female without prior history of CAD who [place]2021 [place] with chest pain.  She was transported urgently to [facility] where she underwent diagnostic coronary angiography by Dr. Bryan Lemma demonstrating an occluded RCA which she successfully stented.  Assessment & Plan    1: Coronary artery disease-postop day 2 inferior STEMI treated with PCI drug-eluting stenting of the RCA.  The remainder of her coronary anatomy was free of disease.  Her EF was preserved.  She is on dual antiplatelet therapy including aspirin and Brilinta.  2: Hyperlipidemia-high-dose statin therapy initiated  3: Sinus bradycardia/junctional bradycardia-patient's heart rate was in the 40s and was occasionally junctional.  This morning she is in sinus rhythm in the 50s.  We will hold off on adding a beta-blocker at this time and will continue to monitor.  Plan for discharge home today and ordering a 1 week Zio patch to follow her heart rate and rhythm.  Postop day 2 inferior STEMI treated with PCI and stenting of the RCA by Dr. Herbie Baltimore.  She is clinically stable for discharge home.  She will  need TOC 7 with a 1 week Zio patch after which she can follow-up with Dr. Herbie Baltimore.  For questions or updates, please contact [facility] Please consult [email or web address] for contact info under        Signed, Nanetta Batty, MD  04/03/2021, 11:01 AM

## 2021-04-03 NOTE — Telephone Encounter (Signed)
-----   Message from Arty Baumgartner, NP sent at 04/03/2021 12:17 PM EST ----- Regarding: TOC call Needs TOC call please

## 2021-04-03 NOTE — Discharge Summary (Addendum)
Discharge Summary    Patient ID: Monica Harrison MRN: [id number]; DOB: [birth year]  Admit date: 04/01/2021 Discharge date: 04/03/2021  PCP:  Tacy Learn, FNP   [facility] Providers Cardiologist:  Bryan Lemma, MD     Discharge Diagnoses    Principal Problem:   Acute ST elevation myocardial infarction (STEMI) of inferior wall [facility]) Active Problems:   Acute ST elevation myocardial infarction (STEMI) of inferolateral wall (HCC)   Hyperlipidemia   Prediabetes   Bradycardia  Diagnostic Studies/Procedures    Cath: 04/01/21    Prox RCA to Mid RCA lesion is 100% stenosed.   A drug-eluting stent was successfully placed using a STENT ONYX FRONTIER 2.75X30 --> postdilated to 3.1 mm.   Post intervention, there is a 0% residual stenosis.   ----------------------------------------------   Prox Cx lesion is 10% stenosed.  Mid Cx lesion is 20% stenosed.   ----------------------------------------------   The left ventricular systolic function is normal.  The left ventricular ejection fraction is 55-65% by visual estimate.   LV end diastolic pressure is normal.   There is no aortic valve stenosis.   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 Preserved LVEF Borderline hypotension requiring Levophed infusion.     PLAN OF CARE: Admit to inpatient  -> loaded with Brilinta upon waking up in the CCU.  Then stop Kengreal infusion.            Check 2D echocardiogram            High-dose high density statin            Low-dose beta-blocker   --> If hemodynamically stable on day 2, anticipate fast-track discharge   Bryan Lemma, MD     Diagnostic Dominance: Right Intervention    Echo: 04/02/21  IMPRESSIONS     1. Left ventricular ejection fraction, by estimation, is 65 to 70%. The  left ventricle has normal function. The left  ventricle has no regional  wall motion abnormalities. There is moderate asymmetric left ventricular  hypertrophy of the basal-septal  segment. Left ventricular diastolic parameters are indeterminate.   2. Right ventricular systolic function is mildly reduced. The right  ventricular size is normal. Tricuspid regurgitation signal is inadequate  for assessing PA pressure.   3. Right atrial size was mildly dilated.   4. The mitral valve is normal in structure. No evidence of mitral valve  regurgitation. No evidence of mitral stenosis.   5. The aortic valve is tricuspid. Aortic valve regurgitation is not  visualized. No aortic stenosis is present.   6. The inferior vena cava is dilated in size with <50% respiratory  variability, suggesting right atrial pressure of 15 mmHg.   FINDINGS   Left Ventricle: Left ventricular ejection fraction, by estimation, is 65  to 70%. The left ventricle has normal function. The left ventricle has no  regional wall motion abnormalities. The left ventricular internal cavity  size was normal in size. There is   moderate asymmetric left ventricular hypertrophy of the basal-septal  segment. Left ventricular diastolic parameters are indeterminate.   Right Ventricle: The right ventricular size is normal. Right vetricular  wall thickness was not well visualized. Right ventricular systolic  function is mildly reduced. Tricuspid regurgitation signal is inadequate  for assessing PA pressure.   Left Atrium: Left atrial size was normal in size.   Right Atrium: Right  atrial size was mildly dilated.   Pericardium: Trivial pericardial effusion is present. Presence of  epicardial fat layer.   Mitral Valve: The mitral valve is normal in structure. No evidence of  mitral valve regurgitation. No evidence of mitral valve stenosis. MV peak  gradient, 4.8 mmHg. The mean mitral valve gradient is 1.0 mmHg.   Tricuspid Valve: The tricuspid valve is normal in structure.  Tricuspid  valve regurgitation is trivial.   Aortic Valve: The aortic valve is tricuspid. Aortic valve regurgitation is  not visualized. No aortic stenosis is present. Aortic valve mean gradient  measures 3.0 mmHg. Aortic valve peak gradient measures 7.0 mmHg. Aortic  valve area, by VTI measures 2.24  cm.   Pulmonic Valve: The pulmonic valve was not well visualized. Pulmonic valve  regurgitation is not visualized.   Aorta: The aortic root and ascending aorta are structurally normal, with  no evidence of dilitation.   Venous: The inferior vena cava is dilated in size with less than 50%  respiratory variability, suggesting right atrial pressure of 15 mmHg.   IAS/Shunts: The interatrial septum was not well visualized.  _____________   History of Present Illness     Monica Harrison is a 70 y.o. female with PMH of HLD who presented on 12/18 with chest pain/inferior STEMI.  She reports being in her usual state of health until 9:30 in the morning of admission when she began having substernal chest discomfort that she rated 8 out of 10.  Eventually called EMS.  On EMS arrival with initial evaluation was found to have inferior ST elevation of roughly 2 to 3 mm.  Code STEMI was called.  She was brought to [facility] and taken to the cardiac Cath Lab emergently.  Hospital Course     Inferior STEMI: Underwent cardiac catheterization noted above with PCI/DES x1 to mid RCA.  Recommendations for DAPT with aspirin/Brilinta for at least 1 year.  High-sensitivity troponin peaked at 289.  Evaluated by cardiac rehab and ambulated without recurrent chest pain.  Echocardiogram showed LVEF of 65 to 70% with no regional wall motion abnormality, mildly reduced RV. -- on ASA, Brilinta, statin  Hyperlipidemia: LDL 151 --Started on atorvastatin 80 mg daily --We will need LFTs/FLP in 8 weeks  Sinus bradycardia: Heart rates noted mostly in the 40s with occasional junctional beats.  Beta-blocker is held in the  setting.  We will plan for outpatient cardiac monitor with 1 week Zio patch to follow heart rate/rhythm.  Prediabetes: Hgb A1c 6.7 -- lifestyle modifications -- follow up with PCP  Patient was seen by Dr. Allyson Sabal and deemed stable for discharge home.  Follow-up has been arranged in the office.  Medications sent to [facility] pharmacy.  Educated by [employer].D. prior to discharge.  Did the patient have an acute coronary syndrome (MI, NSTEMI, STEMI, etc) this admission?:  Yes                               AHA/ACC Clinical Performance & Quality Measures: Aspirin prescribed? - Yes ADP Receptor Inhibitor (Plavix/Clopidogrel, Brilinta/Ticagrelor or Effient/Prasugrel) prescribed (includes medically managed patients)? - Yes Beta Blocker prescribed? - No - bradycardia High Intensity Statin (Lipitor 40-80mg  or Crestor 20-40mg ) prescribed? - Yes EF assessed during THIS hospitalization? - Yes For EF <40%, was ACEI/ARB prescribed? - Not Applicable (EF >/= 40%) For EF <40%, Aldosterone Antagonist (Spironolactone or Eplerenone) prescribed? - Not Applicable (EF >/= 40%) Cardiac Rehab Phase II ordered (  including medically managed patients)? - Yes       The patient will be scheduled for a TOC follow up appointment in 10-14 days.  A message has been sent to the [facility] and Scheduling Pool at the office where the patient should be seen for follow up.  _____________  Discharge Vitals Blood pressure (!) 123/52, pulse (!) 46, temperature 98.3 F (36.8 C), temperature source Oral, resp. rate 18, height  (1.6 m), weight 82.8 kg, SpO2 100 %.  Filed Weights   04/01/21 1059 04/02/21 2044  Weight: 83.1 kg 82.8 kg    Labs & Radiologic Studies    CBC Recent Labs    04/01/21 1053 04/01/21 1101 04/02/21 0035  WBC 9.9  --  12.0*  NEUTROABS 5.0  --   --   HGB 12.6 12.6 12.0  HCT 37.3 37.0 35.2*  MCV 89.4  --  88.9  PLT 278  --  223   Basic Metabolic Panel Recent Labs    16/10/96 1053 04/01/21 1101  04/02/21 0527  NA 138 139 135  K 4.2 4.2 4.4  CL 106 106 105  CO2 23  --  21*  GLUCOSE 180* 180* 137*  BUN CREATININE 0.69 0.60 0.80  CALCIUM 9.4  --  9.4   Liver Function Tests Recent Labs    04/01/21 1053  AST 37  ALT 29  ALKPHOS 47  BILITOT 0.6  PROT 6.7  ALBUMIN 3.5   No results for input(s): LIPASE, AMYLASE in the last 72 hours. High Sensitivity Troponin:   Recent Labs  Lab 04/01/21 1053 04/01/21 1306  TROPONINIHS 5 289*    BNP Invalid input(s): POCBNP D-Dimer No results for input(s): DDIMER in the last 72 hours. Hemoglobin A1C Recent Labs    04/01/21 1053  HGBA1C 6.7*   Fasting Lipid Panel Recent Labs    04/01/21 1053  CHOL 224*  HDL 42  LDLCALC 151*  TRIG 157*  CHOLHDL 5.3   Thyroid Function Tests No results for input(s): TSH, T4TOTAL, T3FREE, THYROIDAB in the last 72 hours.  Invalid input(s): FREET3 _____________  CARDIAC CATHETERIZATION  Result Date: 04/01/2021   Prox RCA to Mid RCA lesion is 100% stenosed.   A drug-eluting stent was successfully placed using a STENT ONYX FRONTIER 2.75X30 --> postdilated to 3.1 mm.   Post intervention, there is a 0% residual stenosis.   ----------------------------------------------   Prox Cx lesion is 10% stenosed.  Mid Cx lesion is 20% stenosed.   ----------------------------------------------   The left ventricular systolic function is normal.  The left ventricular ejection fraction is 55-65% by visual estimate.   LV end diastolic pressure is normal.   There is no aortic valve stenosis. 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 Preserved LVEF Borderline hypotension requiring Levophed infusion. PLAN OF CARE: Admit to inpatient  -> loaded with Brilinta upon waking up in the CCU.  Then stop Kengreal infusion.  Check 2D echocardiogram  High-dose high density statin   Low-dose beta-blocker --> If hemodynamically stable on day 2, anticipate fast-track discharge Bryan Lemma, MD  DG Chest Portable 1 View  Result Date: 04/01/2021 CLINICAL DATA:  Status post left heart catheterization and coronary angiography. Chest pain. EXAM: PORTABLE CHEST 1 VIEW COMPARISON:  January 03, 2021 FINDINGS: Mild infiltrate in the periphery of the lungs has improved since September 2022  but does persist. The cardiomediastinal silhouette is stable given portable technique. No pneumothorax. Mild edema/pulmonary venous congestion not excluded. No other acute abnormalities. IMPRESSION: 1. Improving peripheral infiltrates compared to September 2022. 2. Mild pulmonary venous congestion/edema not excluded. 3. No other acute abnormalities. Electronically Signed   By: Gerome Sam III M.D.   On: 04/01/2021 13:36   ECHOCARDIOGRAM COMPLETE  Result Date: 04/02/2021    ECHOCARDIOGRAM REPORT   Patient Name:   Monica Harrison Date of Exam: 04/02/2021 Medical Rec #:  [id number]        Height:       63.0 in Accession #:    [id number]       Weight:       183.2 lb Date of Birth:  [birth year]         BSA:          1.863 m Patient Age:    70 years         BP:           113/68 mmHg Patient Gender: F                HR:           49 bpm. Exam Location:  Inpatient Procedure: 2D Echo, Cardiac Doppler and Color Doppler Indications:    Myocardial infarct                 CAD Native Vessel  History:        Patient has prior history of Echocardiogram examinations, most                 recent 02/16/2021. Acute MI. S/P PCI.  Sonographer:    Ross Ludwig RDCS (AE) Referring Phys: [id number] DAVID W HARDING IMPRESSIONS  1. Left ventricular ejection fraction, by estimation, is 65 to 70%. The left ventricle has normal function. The left ventricle has no regional wall motion abnormalities. There is moderate asymmetric left ventricular hypertrophy of the basal-septal segment. Left ventricular diastolic parameters are indeterminate.  2.  Right ventricular systolic function is mildly reduced. The right ventricular size is normal. Tricuspid regurgitation signal is inadequate for assessing PA pressure.  3. Right atrial size was mildly dilated.  4. The mitral valve is normal in structure. No evidence of mitral valve regurgitation. No evidence of mitral stenosis.  5. The aortic valve is tricuspid. Aortic valve regurgitation is not visualized. No aortic stenosis is present.  6. The inferior vena cava is dilated in size with <50% respiratory variability, suggesting right atrial pressure of 15 mmHg. FINDINGS  Left Ventricle: Left ventricular ejection fraction, by estimation, is 65 to 70%. The left ventricle has normal function. The left ventricle has no regional wall motion abnormalities. The left ventricular internal cavity size was normal in size. There is  moderate asymmetric left ventricular hypertrophy of the basal-septal segment. Left ventricular diastolic parameters are indeterminate. Right Ventricle: The right ventricular size is normal. Right vetricular wall thickness was not well visualized. Right ventricular systolic function is mildly reduced. Tricuspid regurgitation signal is inadequate for assessing PA pressure. Left Atrium: Left atrial size was normal in size. Right Atrium: Right atrial size was mildly dilated. Pericardium: Trivial pericardial effusion is present. Presence of epicardial fat layer. Mitral Valve: The mitral valve is normal in structure. No evidence of mitral valve regurgitation. No evidence of mitral valve stenosis. MV peak gradient, 4.8 mmHg. The mean mitral valve gradient is 1.0 mmHg. Tricuspid Valve: The tricuspid valve is normal in structure. Tricuspid  valve regurgitation is trivial. Aortic Valve: The aortic valve is tricuspid. Aortic valve regurgitation is not visualized. No aortic stenosis is present. Aortic valve mean gradient measures 3.0 mmHg. Aortic valve peak gradient measures 7.0 mmHg. Aortic valve area, by VTI  measures 2.24 cm. Pulmonic Valve: The pulmonic valve was not well visualized. Pulmonic valve regurgitation is not visualized. Aorta: The aortic root and ascending aorta are structurally normal, with no evidence of dilitation. Venous: The inferior vena cava is dilated in size with less than 50% respiratory variability, suggesting right atrial pressure of 15 mmHg. IAS/Shunts: The interatrial septum was not well visualized.  LEFT VENTRICLE PLAX 2D LVIDd:         4.10 cm   Diastology LVIDs:         2.50 cm   LV e' medial:    6.92 cm/s LV PW:         1.10 cm   LV E/e' medial:  15.8 LV IVS:        1.50 cm   LV e' lateral:   9.02 cm/s LVOT diam:     2.00 cm   LV E/e' lateral: 12.1 LV SV:         57 LV SV Index:   30 LVOT Area:     3.14 cm  RIGHT VENTRICLE            IVC RV Basal diam:  3.50 cm    IVC diam: 2.20 cm RV S prime:     9.20 cm/s LEFT ATRIUM             Index        RIGHT ATRIUM           Index LA diam:        3.50 cm 1.88 cm/m   RA Area:     17.60 cm LA Vol (A2C):   35.4 ml 19.00 ml/m  RA Volume:   56.10 ml  30.11 ml/m LA Vol (A4C):   28.0 ml 15.03 ml/m LA Biplane Vol: 31.3 ml 16.80 ml/m  AORTIC VALVE AV Area (Vmax):    2.25 cm AV Area (Vmean):   2.13 cm AV Area (VTI):     2.24 cm AV Vmax:           132.00 cm/s AV Vmean:          80.700 cm/s AV VTI:            0.253 m AV Peak Grad:      7.0 mmHg AV Mean Grad:      3.0 mmHg LVOT Vmax:         94.60 cm/s LVOT Vmean:        54.700 cm/s LVOT VTI:          0.180 m LVOT/AV VTI ratio: 0.71  AORTA Ao Root diam: 3.50 cm Ao Asc diam:  3.40 cm MITRAL VALVE MV Area (PHT): 3.61 cm     SHUNTS MV Area VTI:   1.54 cm     Systemic VTI:  0.18 m MV Peak grad:  4.8 mmHg     Systemic Diam: 2.00 cm MV Mean grad:  1.0 mmHg MV Vmax:       1.10 m/s MV Vmean:      45.2 cm/s MV Decel Time: 210 msec MV E velocity: 109.00 cm/s Epifanio Lesches MD Electronically signed by Epifanio Lesches MD Signature Date/Time: 04/02/2021/10:41:04 AM    Final    Disposition   Pt is  being discharged  home today in good condition.  Follow-up Plans & Appointments     Follow-up Information     Ellsworth Lennox, PA-C Follow up on 04/25/2021.   Specialties: Physician Assistant, Cardiology Why: at 3:30pm for your follow up with Dr. Erich Montane' PA [place].   Office will mail your heart monitor to you Contact information: [address] [place] [address] [phone]                Discharge Instructions     Amb Referral to Cardiac Rehabilitation   Complete by: As directed    Diagnosis:  Coronary Stents STEMI     After initial evaluation and assessments completed: Virtual Based Care may be provided alone or in conjunction with Phase 2 Cardiac Rehab based on patient barriers.: Yes   Call MD for:  difficulty breathing, headache or visual disturbances   Complete by: As directed    Call MD for:  persistant dizziness or light-headedness   Complete by: As directed    Call MD for:  redness, tenderness, or signs of infection (pain, swelling, redness, odor or green/yellow discharge around incision site)   Complete by: As directed    Diet - low sodium heart healthy   Complete by: As directed    Discharge instructions   Complete by: As directed    Radial Site Care Refer to this sheet in the next few weeks. These instructions provide you with information on caring for yourself after your procedure. Your caregiver may also give you more specific instructions. Your treatment has been planned according to current medical practices, but problems sometimes occur. Call your caregiver if you have any problems or questions after your procedure. HOME CARE INSTRUCTIONS You may shower the day after the procedure. Remove the bandage (dressing) and gently wash the site with plain soap and water. Gently pat the site dry.  Do not apply powder or lotion to the site.  Do not submerge the affected site in water for 3 to 5 days.  Inspect the site at least twice daily.  Do not flex  or bend the affected arm for 24 hours.  No lifting over 5 pounds (2.3 kg) for 5 days after your procedure.  Do not drive home if you are discharged the same day of the procedure. Have someone else drive you.  You may drive 24 hours after the procedure unless otherwise instructed by your caregiver.  What to expect: Any bruising will usually fade within 1 to 2 weeks.  Blood that collects in the tissue (hematoma) may be painful to the touch. It should usually decrease in size and tenderness within 1 to 2 weeks.  SEEK IMMEDIATE MEDICAL CARE IF: You have unusual pain at the radial site.  You have redness, warmth, swelling, or pain at the radial site.  You have drainage (other than a small amount of blood on the dressing).  You have chills.  You have a fever or persistent symptoms for more than 72 hours.  You have a fever and your symptoms suddenly get worse.  Your arm becomes pale, cool, tingly, or numb.  You have heavy bleeding from the site. Hold pressure on the site.   PLEASE DO NOT MISS ANY DOSES OF YOUR BRILINTA!!!!! Also keep a log of you blood pressures and bring back to your follow up appt. Please call the office with any questions.   Patients taking blood thinners should generally stay away from medicines like ibuprofen, Advil, Motrin, naproxen, and Aleve due to  risk of stomach bleeding. You may take Tylenol as directed or talk to your primary doctor about alternatives.   PLEASE ENSURE THAT YOU DO NOT RUN OUT OF YOUR BRILINTA. This medication is very important to remain on for at least one year. IF you have issues obtaining this medication due to cost please CALL the office 3-5 business days prior to running out in order to prevent missing doses of this medication.   Increase activity slowly   Complete by: As directed        Discharge Medications   Allergies as of 04/03/2021       Reactions   Avelox [moxifloxacin Hcl In Nacl] Anaphylaxis   Biaxin [clarithromycin] Anaphylaxis    Lactose Intolerance (gi) Other (See Comments)   Bloating, gas, congestion   Tobramycin Other (See Comments)   Severe headaches    Yeast-related Products Swelling   Tongue swelling /candida        Medication List     STOP taking these medications    benzonatate 200 MG capsule Commonly known as: TESSALON       TAKE these medications    albuterol 108 (90 Base) MCG/ACT inhaler Commonly known as: VENTOLIN HFA Inhale 2 puffs into the lungs every 6 (six) hours as needed for wheezing or shortness of breath.   albuterol (2.5 MG/3ML) 0.083% nebulizer solution Commonly known as: PROVENTIL Take 3 mLs (2.5 mg total) by nebulization every 6 (six) hours as needed for wheezing or shortness of breath.   ALPRAZolam 0.25 MG tablet Commonly known as: XANAX Take 0.25 mg by mouth at bedtime.   aspirin 81 MG chewable tablet Chew 1 tablet (81 mg total) by mouth daily. Start taking on: April 04, 2021   atorvastatin 80 MG tablet Commonly known as: LIPITOR Take 1 tablet (80 mg total) by mouth daily. Start taking on: April 04, 2021   B COMPLEX 50 PO Take 1 tablet by mouth daily.   budesonide 32 MCG/ACT nasal spray Commonly known as: RHINOCORT AQUA Place 1 spray into both nostrils daily as needed for rhinitis.   CAL-MAG-ZINC PO Take 2 tablets by mouth at bedtime.   Co Q 10 100 MG Caps Take 100 mg by mouth every evening.   Enzyme Digest Caps Take 1 capsule by mouth 2 (two) times daily with a meal.   fluconazole 150 MG tablet Commonly known as: DIFLUCAN Take 150 mg by mouth once.   guaiFENesin-dextromethorphan 100-10 MG/5ML syrup Commonly known as: ROBITUSSIN DM Take 10 mLs by mouth every 4 (four) hours as needed for cough.   hydrocortisone 5 MG tablet Commonly known as: CORTEF Take 5 mg by mouth See admin instructions. TAKE 10MG  IN THE MORNING AND 5 MG IN THE AFTERNOON.   ibuprofen 200 MG tablet Commonly known as: ADVIL Take 400 mg by mouth every 6 (six) hours as  needed for moderate pain.   Magnesium 400 MG Tabs Take 400 mg by mouth at bedtime.   multivitamin with minerals Tabs tablet Take 3 tablets by mouth daily. Pure-Oasis   nitroGLYCERIN 0.4 MG SL tablet Commonly known as: Nitrostat Place 1 tablet (0.4 mg total) under the tongue every 5 (five) minutes as needed.   nystatin 500000 units Tabs tablet Commonly known as: MYCOSTATIN Take 500,000 Units by mouth daily as needed. yeast   nystatin cream Commonly known as: MYCOSTATIN Apply 1 application topically 2 (two) times daily as needed for rash.   OMEGA 3 PO Take 2 capsules by mouth daily.   Polyethyl Glycol-Propyl  Glycol 0.4-0.3 % Soln Place 1 drop into both eyes 4 (four) times daily.   polyethylene glycol 17 g packet Commonly known as: MIRALAX / GLYCOLAX Take 17 g by mouth daily as needed for mild constipation.   PROBIOTIC PO Take 1 capsule by mouth every evening.   thyroid 30 MG tablet Commonly known as: ARMOUR Take 30-45 mg by mouth See admin instructions. Taking 45 in the AM and 30 mg in the afternoon.   ticagrelor 90 MG Tabs tablet Commonly known as: BRILINTA Take 1 tablet (90 mg total) by mouth 2 (two) times daily.   triamcinolone cream 0.1 % Commonly known as: KENALOG Apply 1 application topically 3 (three) times daily as needed. Skin irritation   vitamin A 30865 UNIT capsule Take 10,000 Units by mouth every evening.   vitamin C 1000 MG tablet Take 2,000 mg by mouth 2 (two) times daily.   Vitamin D3 125 MCG (5000 UT) Caps Take 5,000 Units by mouth at bedtime.          Outstanding Labs/Studies   FLP/LFTs in 8 weeks  Zio (7 day)  Duration of Discharge Encounter   Greater than 30 minutes including physician time.  Signed, Laverda Page, NP 04/03/2021, 12:18 PM   Agree with note by Laverda Page NP-C  Ms. Bethanie Dicker was admitted with an inferior STEMI.  She underwent PCI and stenting of an occluded RCA by Dr. Herbie Baltimore 2 days ago.  Her left system was  free of disease.  LV function was well-preserved.  She is stable for discharge home this morning.  She has had no recurrent symptoms.  Her radial puncture site is well-healed.  She will see Dr. Herbie Baltimore back in follow-up.  Runell Gess, M.D., FACP, [facility], [facility] [address] [place], [place]  [address]  [phone] 04/03/2021 2:05 PM

## 2021-04-03 NOTE — Telephone Encounter (Signed)
Patient discharged from hospital today   [facility] appointment 04/25/21

## 2021-04-03 NOTE — Progress Notes (Signed)
CARDIAC REHAB PHASE I   PRE:  Rate/Rhythm: 48 SB  BP:  Sitting: 121/62      SaO2: 95 RA  MODE:  Ambulation: 350 ft   POST:  Rate/Rhythm: 69 SR  BP:  Sitting: 126/68    SaO2: 97 RA   Pt ambulated 381ft in hallway independently with slow, steady gait. PT with some SOB, coached through purse lipped breathing. Pt returned to bed. Reinforced MI education with pt and husband. Questions/concerns addressed. Will refer to [facility] [place].  [id number] Reynold Bowen, RN BSN 04/03/2021 11:20 AM

## 2021-04-03 NOTE — Progress Notes (Signed)
°   04/02/21 1130  Clinical Encounter Type  Visited With Patient;Family  Visit Type Initial;Critical Care  Referral From Nurse;Patient  Spiritual Encounters  Spiritual Needs Prayer;Emotional  Stress Factors  Patient Stress Factors Loss;Major life changes   CH visited pt. per [facility] consult for prayer; pt. sitting up in recliner at bedside.  She shared that she began experiencing chest pain while at church yesterday and that when this pain worsened EMTs in her congregation attended to her until EMS arrived to bring her to [facility] where she was told she was suffering a heart attack.  Pt. had a stent placed in cath lab.  Pt. is grateful that she was able to get to the hospital relatively quickly.  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  Pt. lost her father 29yrs ago and shared that the Christmas season has been difficult for her ever since, as her father's birthday was Christmas day.  Pt. lives w/her husband and has children who live nearby.  She requested prayer at end of visit.  Chaplains remain available for further support as needed.   Elpidio Anis, Chaplain Pager: [phone]

## 2021-04-04 NOTE — Progress Notes (Addendum)
Discharge Progress Report  Patient Details  Name: Monica Harrison MRN: [id number] Date of Birth: September 22, 1950 Referring Provider:   [place]ne from 02/08/2021 in [place]  Referring Provider Dr. Vaughan Browner        Number of Visits: 11  Reason for Discharge:  Early Exit:  Pt had a STEMI and a coronary artery stent placed. She has been referred to the cardiac rehab program.   Smoking History:  Social History   Tobacco Use  Smoking Status Never  Smokeless Tobacco Never    Diagnosis:  DOE (dyspnea on exertion)  Post covid-19 condition, unspecified  ADL UCSD:  Pulmonary Assessment Scores     Row Name 02/08/21 0836         ADL UCSD   SOB Score total 8     Rest 0     Walk 2     Stairs 2     Bath 0     Dress 0     Shop 0       CAT Score   CAT Score 7       mMRC Score   mMRC Score 2              Initial Exercise Prescription:  Initial Exercise Prescription - 02/08/21 1000       Date of Initial Exercise RX and Referring Provider   Date 02/08/21    Referring Provider Dr. Vaughan Browner    Expected Discharge Date 06/14/21      Treadmill   MPH 1    Grade 0    Minutes 17      NuStep   Level 1    SPM 60    Minutes 22      Prescription Details   Frequency (times per week) 2    Duration Progress to 30 minutes of continuous aerobic without signs/symptoms of physical distress      Intensity   THRR 40-80% of Max Heartrate 60-119    Ratings of Perceived Exertion 11-15    Perceived Dyspnea 0-4      Resistance Training   Training Prescription Yes    Weight 3    Reps 10-15             Discharge Exercise Prescription (Final Exercise Prescription Changes):  Exercise Prescription Changes - 03/22/21 1100       Response to Exercise   Blood Pressure (Admit) 102/60    Blood Pressure (Exercise) 170/80    Blood Pressure (Exit) 108/64    Heart Rate (Admit) 95 bpm    Heart Rate (Exercise) 111  bpm    Heart Rate (Exit) 96 bpm    Oxygen Saturation (Admit) 96 %    Oxygen Saturation (Exercise) 95 %    Oxygen Saturation (Exit) 96 %    Rating of Perceived Exertion (Exercise) 12    Perceived Dyspnea (Exercise) 11    Duration Continue with 30 min of aerobic exercise without signs/symptoms of physical distress.    Intensity THRR unchanged      Progression   Progression Continue to progress workloads to maintain intensity without signs/symptoms of physical distress.      Resistance Training   Training Prescription Yes    Weight 1    Reps 10-15    Time 10 Minutes      Treadmill   MPH 1    Grade 0    Minutes 17    METs 1.76  NuStep   Level 1    SPM 56    Minutes 22    METs 1.7      Home Exercise Plan   Plans to continue exercise at Home (comment)    Frequency Add 3 additional days to program exercise sessions.    Initial Home Exercises Provided 03/22/21             Functional Capacity:  6 Minute Walk     Row Name 02/08/21 1016         6 Minute Walk   Phase Initial     Distance 1000 feet     Walk Time 6 minutes     # of Rest Breaks 0     MPH 1.9     METS 2.16     RPE 12     Perceived Dyspnea  11     VO2 Peak 7.55     Symptoms No     Resting HR 84 bpm     Resting BP 124/60     Resting Oxygen Saturation  96 %     Exercise Oxygen Saturation  during 6 min walk 93 %     Max Ex. HR 119 bpm     Max Ex. BP 130/62     2 Minute Post BP 120/60       Interval HR   1 Minute HR 118     2 Minute HR 118     3 Minute HR 112     4 Minute HR 118     5 Minute HR 119     6 Minute HR 115     2 Minute Post HR 92     Interval Heart Rate? Yes       Interval Oxygen   Interval Oxygen? Yes     Baseline Oxygen Saturation % 96 %     1 Minute Oxygen Saturation % 95 %     1 Minute Liters of Oxygen 0 L     2 Minute Oxygen Saturation % 93 %     2 Minute Liters of Oxygen 0 L     3 Minute Oxygen Saturation % 94 %     3 Minute Liters of Oxygen 0 L     4 Minute  Oxygen Saturation % 95 %     4 Minute Liters of Oxygen 0 L     5 Minute Oxygen Saturation % 95 %     5 Minute Liters of Oxygen 0 L     6 Minute Oxygen Saturation % 95 %     6 Minute Liters of Oxygen 0 L     2 Minute Post Oxygen Saturation % 98 %     2 Minute Post Liters of Oxygen 0 L              Psychological, QOL, Others - Outcomes: PHQ 2/9: Depression screen PHQ 2/9 02/08/2021  Decreased Interest 1  Down, Depressed, Hopeless 0  PHQ - 2 Score 1  Altered sleeping 0  Tired, decreased energy 1  Change in appetite 0  Feeling bad or failure about yourself  0  Trouble concentrating 0  Moving slowly or fidgety/restless 0  Suicidal thoughts 0  PHQ-9 Score 2  Difficult doing work/chores Not difficult at all    Quality of Life:  Quality of Life - 02/08/21 1023       Quality of Life   Select Quality of Life      Quality  of Life Scores   Health/Function Pre 21 %    Socioeconomic Pre 21 %    Psych/Spiritual Pre 21 %    Family Pre 21 %    GLOBAL Pre 21 %             Personal Goals: Goals established at orientation with interventions provided to work toward goal.  Personal Goals and Risk Factors at Admission - 02/08/21 0858       Core Components/Risk Factors/Patient Goals on Admission    Weight Management Yes;Weight Loss    Intervention Weight Management: Develop a combined nutrition and exercise program designed to reach desired caloric intake, while maintaining appropriate intake of nutrient and fiber, sodium and fats, and appropriate energy expenditure required for the weight goal.;Weight Management: Provide education and appropriate resources to help participant work on and attain dietary goals.    Expected Outcomes Short Term: Continue to assess and modify interventions until short term weight is achieved;Long Term: Adherence to nutrition and physical activity/exercise program aimed toward attainment of established weight goal;Weight Loss: Understanding of general  recommendations for a balanced deficit meal plan, which promotes 1-2 lb weight loss per week and includes a negative energy balance of [phone] kcal/d;Understanding recommendations for meals to include 15-35% energy as protein, 25-35% energy from fat, 35-60% energy from carbohydrates, less than 200mg  of dietary cholesterol, 20-35 gm of total fiber daily;Understanding of distribution of calorie intake throughout the day with the consumption of 4-5 meals/snacks    Improve shortness of breath with ADL's Yes    Intervention Provide education, individualized exercise plan and daily activity instruction to help decrease symptoms of SOB with activities of daily living.    Expected Outcomes Short Term: Improve cardiorespiratory fitness to achieve a reduction of symptoms when performing ADLs;Long Term: Be able to perform more ADLs without symptoms or delay the onset of symptoms              Personal Goals Discharge:  Goals and Risk Factor Review     Row Name 02/14/21 1331 03/15/21 1331           Core Components/Risk Factors/Patient Goals Review   Personal Goals Review Improve shortness of breath with ADL's;Weight Management/Obesity;Develop more efficient breathing techniques such as purse lipped breathing and diaphragmatic breathing and practicing self-pacing with activity. Improve shortness of breath with ADL's;Weight Management/Obesity;Develop more efficient breathing techniques such as purse lipped breathing and diaphragmatic breathing and practicing self-pacing with activity.      Review Patien referred to PR with DOE & post COVID by Dr. Vaughan Browner.  She is new to the program completing the 1st session. Her perseonal goals for the program are to get stronger, decreased DOE , loss weight and feel better again.  We will continue to monitor her progress as she works toward meeting these goals. Patient has completed 8 sessoins. She is doing well in the program with progression and consistent attendance.  Her  personal goals for the program are to get stronger, decreased DOE , loss weight and feel better again.  We will continue to monitor her progress as she works toward meeting these goals.  Continues to demonstrate a positive outlook . Tolerated exercise without any resp distress O2 Sat 95%- 97% no oxygen needed.      Expected Outcomes Patient will complete the program meeting both program and personal goals. Patient will complete the program meeting both program and personal goals.  Exercise Goals and Review:  Exercise Goals     Row Name 02/08/21 1020 03/20/21 1301           Exercise Goals   Increase Physical Activity Yes Yes      Intervention Provide advice, education, support and counseling about physical activity/exercise needs.;Develop an individualized exercise prescription for aerobic and resistive training based on initial evaluation findings, risk stratification, comorbidities and participant's personal goals. Provide advice, education, support and counseling about physical activity/exercise needs.;Develop an individualized exercise prescription for aerobic and resistive training based on initial evaluation findings, risk stratification, comorbidities and participant's personal goals.      Expected Outcomes Short Term: Attend rehab on a regular basis to increase amount of physical activity.;Long Term: Add in home exercise to make exercise part of routine and to increase amount of physical activity.;Long Term: Exercising regularly at least 3-5 days a week. Short Term: Attend rehab on a regular basis to increase amount of physical activity.;Long Term: Add in home exercise to make exercise part of routine and to increase amount of physical activity.;Long Term: Exercising regularly at least 3-5 days a week.      Increase Strength and Stamina Yes Yes      Intervention Provide advice, education, support and counseling about physical activity/exercise needs.;Develop an  individualized exercise prescription for aerobic and resistive training based on initial evaluation findings, risk stratification, comorbidities and participant's personal goals. Provide advice, education, support and counseling about physical activity/exercise needs.;Develop an individualized exercise prescription for aerobic and resistive training based on initial evaluation findings, risk stratification, comorbidities and participant's personal goals.      Expected Outcomes Short Term: Increase workloads from initial exercise prescription for resistance, speed, and METs.;Short Term: Perform resistance training exercises routinely during rehab and add in resistance training at home;Long Term: Improve cardiorespiratory fitness, muscular endurance and strength as measured by increased METs and functional capacity ( ) Short Term: Increase workloads from initial exercise prescription for resistance, speed, and METs.;Short Term: Perform resistance training exercises routinely during rehab and add in resistance training at home;Long Term: Improve cardiorespiratory fitness, muscular endurance and strength as measured by increased METs and functional capacity ( )      Able to understand and use rate of perceived exertion (RPE) scale Yes Yes      Intervention Provide education and explanation on how to use RPE scale Provide education and explanation on how to use RPE scale      Expected Outcomes Short Term: Able to use RPE daily in rehab to express subjective intensity level;Long Term:  Able to use RPE to guide intensity level when exercising independently Short Term: Able to use RPE daily in rehab to express subjective intensity level;Long Term:  Able to use RPE to guide intensity level when exercising independently      Able to understand and use Dyspnea scale Yes Yes      Intervention Provide education and explanation on how to use Dyspnea scale Provide education and explanation on how to use Dyspnea scale       Expected Outcomes Short Term: Able to use Dyspnea scale daily in rehab to express subjective sense of shortness of breath during exertion;Long Term: Able to use Dyspnea scale to guide intensity level when exercising independently Short Term: Able to use Dyspnea scale daily in rehab to express subjective sense of shortness of breath during exertion;Long Term: Able to use Dyspnea scale to guide intensity level when exercising independently      Knowledge and understanding of Target Heart  Rate Range (THRR) Yes Yes      Intervention Provide education and explanation of THRR including how the numbers were predicted and where they are located for reference Provide education and explanation of THRR including how the numbers were predicted and where they are located for reference      Expected Outcomes Short Term: Able to state/look up THRR;Long Term: Able to use THRR to govern intensity when exercising independently;Short Term: Able to use daily as guideline for intensity in rehab Short Term: Able to state/look up THRR;Long Term: Able to use THRR to govern intensity when exercising independently;Short Term: Able to use daily as guideline for intensity in rehab      Understanding of Exercise Prescription Yes Yes      Intervention Provide education, explanation, and written materials on patient's individual exercise prescription Provide education, explanation, and written materials on patient's individual exercise prescription      Expected Outcomes Short Term: Able to explain program exercise prescription;Long Term: Able to explain home exercise prescription to exercise independently Short Term: Able to explain program exercise prescription;Long Term: Able to explain home exercise prescription to exercise independently               Exercise Goals Re-Evaluation:  Exercise Goals Re-Evaluation     Row Name 02/20/21 1251 03/20/21 1301           Exercise Goal Re-Evaluation   Exercise Goals Review  Increase Physical Activity;Increase Strength and Stamina;Able to understand and use rate of perceived exertion (RPE) scale;Able to understand and use Dyspnea scale;Knowledge and understanding of Target Heart Rate Range (THRR);Understanding of Exercise Prescription Increase Physical Activity;Increase Strength and Stamina;Able to understand and use rate of perceived exertion (RPE) scale;Able to understand and use Dyspnea scale;Knowledge and understanding of Target Heart Rate Range (THRR);Understanding of Exercise Prescription      Comments Pt has completed 3 sessions of pulmonary rehab. She is motivated to be in rehab. She is currently exercising at 1.76 METs on the TM. Will continue to montior and progress as able. Pt has completed 9 sessions of pulmonary rehab. She is motivated when she comes into class. Pt could push herself more during class to increase workload and weight but does not want to due to her work involving making arringments from [employer], she does not want to be sore when she goes home. She is currently exercising at 1.7 METs on the stepper. Will continue to monitor and progress as able.      Expected Outcomes Through exercise at rehab and at home, the patient will reach their stated goals. Through exercise at rehab and at home, the patient will reach their stated goals.               Nutrition & Weight - Outcomes:  Pre Biometrics - 02/08/21 1021       Pre Biometrics   Height 5\' 3"  (1.6 m)    Weight 184 lb 8.4 oz (83.7 kg)    Waist Circumference 43.5 inches    Hip Circumference 41 inches    Waist to Hip Ratio 1.06 %    BMI (Calculated) 32.7    Triceps Skinfold 23 mm    % Body Fat 44.1 %    Grip Strength 17.6 kg    Flexibility 6.5 in    Single Leg Stand 7.52 seconds              Nutrition:  Nutrition Therapy & Goals - 03/15/21 1327  Personal Nutrition Goals   Comments We will provide nutritional education through handouts  and discuss chooses with a  heart healthy nutrition and assistance with RD refereal if patient is interested. Will continue to monitor weight and encourage weight loss. Last weight was 83.3kg      Intervention Plan   Intervention Nutrition handout(s) given to patient.    Expected Outcomes Short Term Goal: Understand basic principles of dietary content, such as calories, fat, sodium, cholesterol and nutrients.             Nutrition Discharge:  Nutrition Assessments - 02/08/21 0851       MEDFICTS Scores   Pre Score 31             Education Questionnaire Score:  Knowledge Questionnaire Score - 02/08/21 0855       Knowledge Questionnaire Score   Pre Score 18/18             Goals reviewed with patient; copy given to patient. Pt discharged from pulmonary rehab due to having a STEMI and coronary artery stent placement. We have received a referral for her to participate in cardiac rehab.

## 2021-04-04 NOTE — Telephone Encounter (Signed)
Patient contacted regarding discharge from [facility] on 04/04/21.  Patient understands to follow up with provider B. Strader PA on 04/25/21 at 3:30 pm at [facility]. Patient understands discharge instructions? yes  Patient understands medications and regiment? yes Patient understands to bring all medications to this visit? yes

## 2021-04-05 ENCOUNTER — Encounter (HOSPITAL_COMMUNITY): Payer: Medicare Other

## 2021-04-05 DIAGNOSIS — R001 Bradycardia, unspecified: Secondary | ICD-10-CM | POA: Diagnosis not present

## 2021-04-10 ENCOUNTER — Encounter (HOSPITAL_COMMUNITY): Payer: Medicare Other

## 2021-04-11 DIAGNOSIS — I2510 Atherosclerotic heart disease of native coronary artery without angina pectoris: Secondary | ICD-10-CM | POA: Insufficient documentation

## 2021-04-12 ENCOUNTER — Encounter (HOSPITAL_COMMUNITY): Payer: Medicare Other

## 2021-04-16 ENCOUNTER — Telehealth (HOSPITAL_COMMUNITY): Payer: Self-pay | Admitting: Pharmacist

## 2021-04-16 ENCOUNTER — Other Ambulatory Visit (HOSPITAL_COMMUNITY): Payer: Self-pay

## 2021-04-16 NOTE — Telephone Encounter (Signed)
Pharmacy Transitions of Care Follow-up Telephone Call  Date of discharge: 04/03/21  Discharge Diagnosis: STEMI w/stent  How have you been since you were released from the hospital?  Good, except some bruising remains at cath site.  Medication changes made at discharge:  - START:  Aspirin Low Dose (aspirin)  atorvastatin (LIPITOR)  Brilinta (ticagrelor)  nitroGLYCERIN (Nitrostat)   - STOPPED: benzonatate   Medication changes verified by the patient? Yes    Medication Accessibility:  Home Pharmacy: [place]   Was the patient provided with refills on discharged medications?  Yes  Have all prescriptions been transferred from [facility] to home pharmacy?  yes  Is the patient able to afford medications? Part D plan Notable copays: Brilinta, $47/mo Eligible patient assistance:     Medication Review: (TICAGRELOR (BRILINTA) Ticagrelor 90 mg BID initiated on 04/01/21.  - Educated patient on expected duration of therapy of aspirin with ticagrelor of at least 1 year.  - Discussed importance of taking medication around the same time every day, - Reviewed potential DDIs with patient - Advised patient of medications to avoid (NSAIDs, aspirin maintenance doses>100 mg daily) - Educated that Tylenol (acetaminophen) will be the preferred analgesic to prevent risk of bleeding  - Emphasized importance of monitoring for signs and symptoms of bleeding (abnormal bruising, prolonged bleeding, nose bleeds, bleeding from gums, discolored urine, black tarry stools)  - Educated patient to notify doctor if shortness of breath or abnormal heartbeat occur - Advised patient to alert all providers of antiplatelet therapy prior to starting a new medication or having a procedure    Follow-up Appointments:  PCP Hospital f/u appt confirmed? none  [name] f[facility]u appt confirmed?  Scheduled to see Bernerd Pho, PA, cardio on 04/25/21 @ 3:30.   If their condition worsens, is the pt  aware to call PCP or go to the Emergency Dept.? yes  Final Patient Assessment:  Pt has good understanding of meds and reports compliance.  She is tolerating meds well with no adverse events.

## 2021-04-17 ENCOUNTER — Encounter (HOSPITAL_COMMUNITY): Payer: Medicare Other

## 2021-04-19 ENCOUNTER — Encounter (HOSPITAL_COMMUNITY): Payer: Medicare Other

## 2021-04-20 ENCOUNTER — Encounter (INDEPENDENT_AMBULATORY_CARE_PROVIDER_SITE_OTHER): Payer: Self-pay | Admitting: *Deleted

## 2021-04-24 ENCOUNTER — Encounter (HOSPITAL_COMMUNITY): Payer: Medicare Other

## 2021-04-25 ENCOUNTER — Other Ambulatory Visit: Payer: Self-pay

## 2021-04-25 ENCOUNTER — Ambulatory Visit (INDEPENDENT_AMBULATORY_CARE_PROVIDER_SITE_OTHER): Payer: Medicare Other | Admitting: Student

## 2021-04-25 ENCOUNTER — Encounter: Payer: Self-pay | Admitting: Student

## 2021-04-25 VITALS — BP 106/58 | HR 78 | Ht 63.0 in | Wt 181.0 lb

## 2021-04-25 DIAGNOSIS — R001 Bradycardia, unspecified: Secondary | ICD-10-CM

## 2021-04-25 DIAGNOSIS — I2119 ST elevation (STEMI) myocardial infarction involving other coronary artery of inferior wall: Secondary | ICD-10-CM

## 2021-04-25 DIAGNOSIS — E785 Hyperlipidemia, unspecified: Secondary | ICD-10-CM

## 2021-04-25 MED ORDER — TICAGRELOR 90 MG PO TABS: 90.0000 mg | ORAL_TABLET | Freq: Two times a day (BID) | ORAL | 11 refills | Status: DC

## 2021-04-25 MED ORDER — ATORVASTATIN CALCIUM 40 MG PO TABS: 40.0000 mg | ORAL_TABLET | Freq: Every day | ORAL | 3 refills | Status: DC

## 2021-04-25 NOTE — Patient Instructions (Signed)
Medication Instructions:   Decrease Atorvastatin to 40 mg Daily at night   *If you need a refill on your cardiac medications before your next appointment, please call your pharmacy*   Lab Work: Your physician recommends that you return for lab work in: 2 Months     If you have labs (blood work) drawn today and your tests are completely normal, you will receive your results only by: MyChart Message (if you have MyChart) OR A paper copy in the mail If you have any lab test that is abnormal or we need to change your treatment, we will call you to review the results.   Testing/Procedures: NONE    Follow-Up: At [facility], you and your health needs are our priority.  As part of our continuing mission to provide you with exceptional heart care, we have created designated Provider Care Teams.  These Care Teams include your primary Cardiologist (physician) and Advanced Practice Providers (APPs -  Physician Assistants and Nurse Practitioners) who all work together to provide you with the care you need, when you need it.  We recommend signing up for the patient portal called "MyChart".  Sign up information is provided on this After Visit Summary.  MyChart is used to connect with patients for Virtual Visits (Telemedicine).  Patients are able to view lab/test results, encounter notes, upcoming appointments, etc.  Non-urgent messages can be sent to your provider as well.   To learn more about what you can do with MyChart, go to [email or web address].    Your next appointment:   3 -4 month(s)  The format for your next appointment:   In Person  Provider:   Bryan Lemma, MD     Other Instructions Thank you for choosing [facility]!

## 2021-04-25 NOTE — Progress Notes (Signed)
Cardiology Office Note    Date:  04/26/2021   ID:  AMIJA DODRILL, DOB 14-Mar-1951, MRN [id number]  PCP:  Beola Cord, FNP  Cardiologist: Glenetta Hew, MD    Chief Complaint  Patient presents with   Hospitalization Follow-up    S/p STEMI    History of Present Illness:    Monica Harrison is a 71 y.o. female with past medical history of HLD, hypothyroidism and prediabetes who presents to the office today for hospital follow-up from a recent STEMI.  She was admitted to [facility] on 04/01/2021 after contacting EMS for evaluation of chest pain and initial EKG showing inferior ST elevation. She underwent an emergent cardiac catheterization which showed a 100% stenosis along the proximal to mid RCA which was treated with DES placement. She did have residual 10% proximal LCx stenosis and 20% mid LCx stenosis. Echocardiogram showed preserved EF of 65 to 70% with no regional wall motion abnormalities. RV function was mildly reduced but she did not have any significant valve abnormalities. She was started on ASA and Brilinta along with Atorvastatin 80 mg daily. She did have sinus bradycardia and intermittent junctional bradycardia during admission was maintaining normal sinus rhythm the day of discharge. She was not started on a beta-blocker and a 1 week Zio patch was recommended at discharge.  While her ZIO monitor has not been officially resulted, the preliminary report is available and shows her average heart rate was 48 bpm and the predominant underlying rhythm was sinus rhythm. She did have 2 episodes of SVT with the longest being for 7 beats. Did have intermittent episodes of junctional rhythm as well with 1 pause for 3.1 seconds and this was on 12/23 at 8:17 AM (reviewed with the patient today and she says she is usually asleep at that time).  In talking with the patient and her husband today, she reports initially feeling drained upon returning home and thinks this was likely due to  her statin. Did take 20mg  tablets of Atorvastatin her husband had available and felt much better on this as compared to the 80mg  tablets but has been taking them in the AM. She denies any recurrent chest pain. Has not utilized SL NTG. Does have mild dyspnea with taking Brilinta but this improves with caffeine. No recent orthopnea, PND or pitting edema. No complications regarding her radial cath site.    Past Medical History:  Diagnosis Date   Adrenal insufficiency ([place])    CAD (coronary artery disease)    a. s/p inferior STEMI in 03/2021 with cath showing 100% stenosis along the proximal to mid RCA which was treated with DES placement. Residual 10% proximal LCx stenosis and 20% mid LCx stenosis   Hyperlipidemia    Prediabetes    STEMI (ST elevation myocardial infarction) ([place])    Thyroid disease     Past Surgical History:  Procedure Laterality Date   CORONARY/GRAFT ACUTE MI REVASCULARIZATION N/A 04/01/2021   Procedure: Coronary/Graft Acute MI Revascularization;  Surgeon: Leonie Man, MD;  Location: [place] [facility];  Service: Cardiovascular;  Laterality: N/A;   LEFT HEART CATH AND CORONARY ANGIOGRAPHY N/A 04/01/2021   Procedure: LEFT HEART CATH AND CORONARY ANGIOGRAPHY;  Surgeon: Leonie Man, MD;  Location: [place] [facility];  Service: Cardiovascular;  Laterality: N/A;    Current Medications: Outpatient Medications Prior to Visit  Medication Sig Dispense Refill   acetaminophen (TYLENOL) 325 MG tablet Take 650 mg by mouth every 6 (six) hours as needed.  albuterol (PROVENTIL) (2.5 MG/3ML) 0.083% nebulizer solution Take 3 mLs (2.5 mg total) by nebulization every 6 (six) hours as needed for wheezing or shortness of breath. 140 mL 3   ALPRAZolam (XANAX) 0.25 MG tablet Take 0.25 mg by mouth at bedtime.     Ascorbic Acid (VITAMIN C) 1000 MG tablet Take 2,000 mg by mouth 2 (two) times daily.     aspirin 81 MG chewable tablet Chew 1 tablet (81 mg total) by mouth daily. 90 tablet 2    B Complex Vitamins (B COMPLEX 50 PO) Take 1 tablet by mouth daily.      budesonide (RHINOCORT AQUA) 32 MCG/ACT nasal spray Place 1 spray into both nostrils daily as needed for rhinitis.     Calcium-Magnesium-Zinc (CAL-MAG-ZINC PO) Take 2 tablets by mouth at bedtime.     Cholecalciferol (VITAMIN D3) 5000 units CAPS Take 5,000 Units by mouth at bedtime.     Coenzyme Q10 (CO Q 10) 100 MG CAPS Take 100 mg by mouth every evening.      diclofenac Sodium (VOLTAREN) 1 % GEL Apply 1 application topically 4 (four) times daily.     Digestive Enzymes (ENZYME DIGEST) CAPS Take 1 capsule by mouth 2 (two) times daily with a meal.      fluconazole (DIFLUCAN) 150 MG tablet Take 150 mg by mouth once.     hydrocortisone (CORTEF) 5 MG tablet Take 5 mg by mouth See admin instructions. TAKE 10MG  IN THE MORNING AND 5 MG IN THE AFTERNOON.     Magnesium 400 MG TABS Take 400 mg by mouth at bedtime.     Multiple Vitamin (MULTIVITAMIN WITH MINERALS) TABS tablet Take 3 tablets by mouth daily. Pure-[place]     nitroGLYCERIN (NITROSTAT) 0.4 MG SL tablet Place 1 tablet (0.4 mg total) under the tongue every 5 (five) minutes as needed. 25 tablet 2   nystatin (MYCOSTATIN) 500000 units TABS tablet Take 500,000 Units by mouth daily as needed. yeast     nystatin cream (MYCOSTATIN) Apply 1 application topically 2 (two) times daily as needed for rash.     Omega-3 Fatty Acids (OMEGA 3 PO) Take 2 capsules by mouth daily.     Polyethyl Glycol-Propyl Glycol 0.4-0.3 % SOLN Place 1 drop into both eyes 4 (four) times daily.      polyethylene glycol (MIRALAX / GLYCOLAX) 17 g packet Take 17 g by mouth daily as needed for mild constipation.     Probiotic Product (PROBIOTIC PO) Take 1 capsule by mouth every evening.      thyroid (ARMOUR) 30 MG tablet Take 30-45 mg by mouth See admin instructions. Taking 45 in the AM and 30 mg in the afternoon.     vitamin A 10000 UNIT capsule Take 10,000 Units by mouth every evening.      atorvastatin (LIPITOR) 80  MG tablet Take 1 tablet (80 mg total) by mouth daily. 90 tablet 1   ticagrelor (BRILINTA) 90 MG TABS tablet Take 1 tablet (90 mg total) by mouth 2 (two) times daily. 180 tablet 2   albuterol (VENTOLIN HFA) 108 (90 Base) MCG/ACT inhaler Inhale 2 puffs into the lungs every 6 (six) hours as needed for wheezing or shortness of breath. (Patient not taking: Reported on 04/25/2021) 8 g 5   guaiFENesin-dextromethorphan (ROBITUSSIN DM) 100-10 MG/5ML syrup Take 10 mLs by mouth every 4 (four) hours as needed for cough. (Patient not taking: Reported on 04/25/2021) 118 mL 0   ibuprofen (ADVIL,MOTRIN) 200 MG tablet Take 400 mg by mouth  every 6 (six) hours as needed for moderate pain. (Patient not taking: Reported on 04/25/2021)     triamcinolone cream (KENALOG) 0.1 % Apply 1 application topically 3 (three) times daily as needed. Skin irritation (Patient not taking: Reported on 04/25/2021)     No facility-administered medications prior to visit.     Allergies:   Avelox [moxifloxacin hcl in nacl], Biaxin [clarithromycin], Lactose intolerance (gi), Tobramycin, and Yeast-related products   Social History   Socioeconomic History   Marital status: Married    Spouse name: Not on file   Number of children: Not on file   Years of education: Not on file   Highest education level: Not on file  Occupational History   Not on file  Tobacco Use   Smoking status: Never   Smokeless tobacco: Never  Vaping Use   Vaping Use: Never used  Substance and Sexual Activity   Alcohol use: No   Drug use: No   Sexual activity: Not on file  Other Topics Concern   Not on file  Social History Narrative   Not on file   Social Determinants of Health   Financial Resource Strain: Not on file  Food Insecurity: Not on file  Transportation Needs: Not on file  Physical Activity: Not on file  Stress: Not on file  Social Connections: Not on file     Family History:  The patient's family history is not on file. She reports no known  family history of CAD.   Review of Systems:    Please see the history of present illness.     All other systems reviewed and are otherwise negative except as noted above.   Physical Exam:    VS:  BP (!) 106/58    Pulse 78    Ht 5\' 3"  (1.6 m)    Wt 181 lb (82.1 kg)    SpO2 96%    BMI 32.06 kg/m    General: Well developed, well nourished,female appearing in no acute distress. Head: Normocephalic, atraumatic. Neck: No carotid bruits. JVD not elevated.  Lungs: Respirations regular and unlabored, without wheezes or rales.  Heart: Regular rate and rhythm. No S3 or S4.  No murmur, no rubs, or gallops appreciated. Abdomen: Appears non-distended. No obvious abdominal masses. Msk:  Strength and tone appear normal for age. No obvious joint deformities or effusions. Extremities: No clubbing or cyanosis. No pitting edema.  Distal pedal pulses are 2+ bilaterally. Radial cath site stable without evidence of a hematoma.  Neuro: Alert and oriented X 3. Moves all extremities spontaneously. No focal deficits noted. Psych:  Responds to questions appropriately with a normal affect. Skin: No rashes or lesions noted  Wt Readings from Last 3 Encounters:  04/25/21 181 lb (82.1 kg)  04/02/21 182 lb 8.7 oz (82.8 kg)  03/20/21 183 lb 3.2 oz (83.1 kg)     Studies/Labs Reviewed:   EKG:  EKG is ordered today.  The ekg ordered today demonstrates NSR, HR 78 with inferior infarct pattern and incomplete RBBB.   Recent Labs: 11/25/2020: Magnesium 2.3 04/01/2021: ALT 29 04/02/2021: BUN 18; Creatinine, Ser 0.80; Hemoglobin 12.0; Platelets 223; Potassium 4.4; Sodium 135   Lipid Panel    Component Value Date/Time   CHOL 224 (H) 04/01/2021 1053   TRIG 157 (H) 04/01/2021 1053   HDL 42 04/01/2021 1053   CHOLHDL 5.3 04/01/2021 1053   VLDL 31 04/01/2021 1053   LDLCALC 151 (H) 04/01/2021 1053    Additional studies/ records that were reviewed today  include:   Cardiac Catheterization: 04/01/2021 Prox RCA to Mid  RCA lesion is 100% stenosed.   A drug-eluting stent was successfully placed using a STENT ONYX FRONTIER 2.75X30 --> postdilated to 3.1 mm.   Post intervention, there is a 0% residual stenosis.   ----------------------------------------------   Prox Cx lesion is 10% stenosed.  Mid Cx lesion is 20% stenosed.   ----------------------------------------------   The left ventricular systolic function is normal.  The left ventricular ejection fraction is 55-65% by visual estimate.   LV end diastolic pressure is normal.   There is no aortic valve stenosis.   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 Preserved LVEF Borderline hypotension requiring Levophed infusion.     PLAN OF CARE: Admit to inpatient  -> loaded with Brilinta upon waking up in the CCU.  Then stop Kengreal infusion.            Check 2D echocardiogram            High-dose high density statin            Low-dose beta-blocker   --> If hemodynamically stable on day 2, anticipate fast-track discharge   Echocardiogram: 03/2021 IMPRESSIONS     1. Left ventricular ejection fraction, by estimation, is 65 to 70%. The  left ventricle has normal function. The left ventricle has no regional  wall motion abnormalities. There is moderate asymmetric left ventricular  hypertrophy of the basal-septal  segment. Left ventricular diastolic parameters are indeterminate.   2. Right ventricular systolic function is mildly reduced. The right  ventricular size is normal. Tricuspid regurgitation signal is inadequate  for assessing PA pressure.   3. Right atrial size was mildly dilated.   4. The mitral valve is normal in structure. No evidence of mitral valve  regurgitation. No evidence of mitral stenosis.   5. The aortic valve is tricuspid. Aortic valve regurgitation is not  visualized. No aortic  stenosis is present.   6. The inferior vena cava is dilated in size with <50% respiratory  variability, suggesting right atrial pressure of 15 mmHg.   Event Monitor: 04/2021 Patch Wear Time:  6 days and 23 hours (2022-12-22T15:14:14-0500 to 2022-12-29T14:54:51-0500)   Patient had a min HR of 28 bpm, max HR of 128 bpm, and avg HR of 48 bpm. Predominant underlying rhythm was Sinus Rhythm. 2 Supraventricular Tachycardia runs occurred, the run with the fastest interval lasting 7 beats with a max rate of 128 bpm (avg 111  bpm); the run with the fastest interval was also the longest. 1 Pause occurred lasting 3.1 secs (20 bpm). Junctional Rhythm was present. Isolated SVEs were occasional (2.2%, 10169), SVE Couplets were rare (<1.0%, 76), and SVE Triplets were rare (<1.0%,  10). Isolated VEs were rare (<1.0%), and no VE Couplets or VE Triplets were present.   Assessment:    1. ST elevation myocardial infarction (STEMI) of inferior wall, subsequent episode of care ([place])   2. Hyperlipidemia LDL goal <70   3. Bradycardia      Plan:   In order of problems listed above:  1. CAD - She is s/p inferior STEMI in 12/20022 with DES to the proximal RCA and residual 10% proximal LCx stenosis and 20% mid LCx stenosis with a preserved ejection fraction. - She denies any recurrent chest pain. Does have some dyspnea with taking Brilinta but this improves  with caffeine. If this progresses in severity, could consider switching to Plavix. Will adjust her Atorvastatin as outlined below. No BB due to recent junctional bradycardia.  - She was previously on several natural supplements for Lyme Disease from [facility] including CSA Formula, Cordyceps Dried Mycelium and [facility] but has not utilized these since her event. I tried to check for drug interactions on Up-to-Date but the above supplements were not listed. I will send a note to pharmacy today to see if they can verify no interactions between these and  her new cardiac medications.   2. HLD - FLP during recent admission showed total cholesterol of 224, HDL 42, triglycerides 157 and LDL 151. She was started on Atorvastatin 80mg  daily but reports significant fatigue after taking this. I recommended we reduce her dose to 40mg  daily and that she take this in the evening hours. Repeat FLP and LFT's in 2 months.   3. Bradycardia - She had episodes of junctional bradycardia during admission and the preliminary report of her Zio monitor placed at discharge showed intermittent episodes of junctional rhythm as well with 1 pause for 3.1 seconds and this was on 12/23 at 8:17 AM (reviewed with the patient today and she says she is usually asleep at that time). - She denies any symptoms and she is in NSR today and HR in the 70's to 80's. Will review with Dr. Ellyn Hack to see if he has any additional recommendations. She did have a sleep study a few years ago and did not need a CPAP at that time per her report. Continue to avoid AV nodal blocking agents.     Cardiac Rehabilitation Eligibility Assessment  The patient is ready to start cardiac rehabilitation from a cardiac standpoint. 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      Medication Adjustments/Labs and Tests Ordered: Current medicines are reviewed at length with the patient today.  Concerns regarding medicines are outlined above.  Medication changes, Labs and Tests ordered today are listed in the Patient Instructions below. Patient Instructions  Medication Instructions:   Decrease Atorvastatin to 40 mg Daily at night   *If you need a refill on your cardiac medications before your next appointment, please call your pharmacy*   Lab Work: Your physician recommends that you return for lab work in: 2 Months     If you have labs (blood work) drawn today and your tests are completely normal, you will  receive your results only by: [place] (if you have MyChart) OR A paper copy in the mail If you have any lab test that is abnormal or we need to change your treatment, we will call you to review the results.   Testing/Procedures: NONE    Follow-Up: At [facility], you and your health needs are our priority.  As part of our continuing mission to provide you with exceptional heart care, we have created designated Provider Care Teams.  These Care Teams include your primary Cardiologist (physician) and Advanced Practice Providers (APPs -  Physician Assistants and Nurse Practitioners) who all work together to provide you with the care you need, when you need it.  We recommend signing up for the patient portal called "MyChart".  Sign up information is provided on this After Visit Summary.  MyChart is used to connect with patients for Virtual Visits (Telemedicine).  Patients are able to view lab/test results, encounter notes,  upcoming appointments, etc.  Non-urgent messages can be sent to your provider as well.   To learn more about what you can do with MyChart, go to [email or web address].    Your next appointment:   3 -4 month(s)  The format for your next appointment:   In Person  Provider:   Glenetta Hew, MD     Other Instructions Thank you for choosing [facility]!      Signed, Erma Heritage, PA-C  04/26/2021 9:50 AM    [facility] [address] [place], [place] [address] Phone: [phone] Fax: [phone]

## 2021-04-26 ENCOUNTER — Encounter: Payer: Self-pay | Admitting: Student

## 2021-04-26 ENCOUNTER — Encounter (HOSPITAL_COMMUNITY): Payer: Medicare Other

## 2021-04-26 ENCOUNTER — Telehealth: Payer: Self-pay | Admitting: Student

## 2021-04-26 NOTE — Telephone Encounter (Signed)
I  spoke with patient and relayed B.Strader's message. She is pleased.

## 2021-04-26 NOTE — Telephone Encounter (Signed)
° ° °  Please let the patient know I reviewed her Waldo Laine Essence supplements with our Pharmacist and there are no specific contraindications listed for those and her current cardiac medications. Cordyceps can interact with Warfarin but she is only on ASA and Brilinta and there was no specific mention of interactions with anti-platelet medications.   Signed, Ellsworth Lennox, PA-C 04/26/2021, 4:19 PM Pager: [phone]

## 2021-05-01 ENCOUNTER — Telehealth: Payer: Self-pay | Admitting: Student

## 2021-05-01 ENCOUNTER — Encounter (HOSPITAL_COMMUNITY): Payer: Medicare Other

## 2021-05-01 NOTE — Telephone Encounter (Signed)
° ° °  Please let the patient know her monitor results were reviewed with Dr. Ellyn Hack who reviewed with EP as well. Given that her episodes were typically nocturnal, they did not recommend any further testing at this time. We will continue to follow over time and avoid AV nodal blocking agents which would drop her heart rate.  Signed, Erma Heritage, PA-C 05/01/2021, 4:34 PM

## 2021-05-02 NOTE — Telephone Encounter (Signed)
Pt states that she has been taking Lipitor 40 mg for 4 days and is still feeling "foggy headed". She reports that she is having left hip pain and knee pain. She states that she took 20 mg of Lipitor last night. She did put Viltaren gel and heat on her hip it has helped some. She wants to know if she can just try the 20 mg of lipitor daily.

## 2021-05-02 NOTE — Telephone Encounter (Signed)
° ° °  Yes, she can reduce to 20mg  daily and keep plans for repeat labs in 2 months for reassessment of her cholesterol.   Signed, , PA-C 05/02/2021, 5:03 PM

## 2021-05-03 ENCOUNTER — Encounter (HOSPITAL_COMMUNITY): Payer: Medicare Other

## 2021-05-03 MED ORDER — ATORVASTATIN CALCIUM 20 MG PO TABS: 20.0000 mg | ORAL_TABLET | Freq: Every day | ORAL | 3 refills | Status: DC

## 2021-05-03 NOTE — Addendum Note (Signed)
Addended by: Kerney Elbe on: 05/03/2021 10:21 AM   Modules accepted: Orders

## 2021-05-03 NOTE — Telephone Encounter (Signed)
Pt notified of dose change and order placed.

## 2021-05-08 ENCOUNTER — Encounter (HOSPITAL_COMMUNITY): Payer: Medicare Other

## 2021-05-10 ENCOUNTER — Encounter (HOSPITAL_COMMUNITY): Payer: Medicare Other

## 2021-05-11 ENCOUNTER — Other Ambulatory Visit (HOSPITAL_COMMUNITY): Payer: Self-pay

## 2021-05-15 ENCOUNTER — Encounter (HOSPITAL_COMMUNITY): Payer: Medicare Other

## 2021-05-17 ENCOUNTER — Encounter (HOSPITAL_COMMUNITY): Payer: Medicare Other

## 2021-05-22 ENCOUNTER — Telehealth: Payer: Self-pay | Admitting: Pulmonary Disease

## 2021-05-22 ENCOUNTER — Encounter (HOSPITAL_COMMUNITY): Payer: Medicare Other

## 2021-05-22 NOTE — Telephone Encounter (Signed)
Called and spoke with patient. She verbalized understanding and stated that she would like to cancel the PFT. PFT appt has been cancelled.   Nothing further needed at time of call.

## 2021-05-22 NOTE — Telephone Encounter (Signed)
We can cancel the PFTs if she cannot give a full effort.  Keep the clinic appointment and I can discuss rescheduling with her at that time

## 2021-05-22 NOTE — Telephone Encounter (Signed)
Called and spoke with patient. She stated that she had a heart attack back in December 2022. She has been slowly recovering. She is still having issues with fatigue. She is currently doing cardiac rehab and even that sometimes will wear her out.   She is scheduled to see Dr. Isaiah Serge on 05/28/20 after a PFT. She wonders if she will need to postpone the PFT until she has gotten her strength back. If she does not have the PFT, she wants to keep the appt with Dr. Isaiah Serge.   Dr. Isaiah Serge, can you please advise about the PFT? Thanks!

## 2021-05-24 ENCOUNTER — Encounter (HOSPITAL_COMMUNITY): Payer: Medicare Other

## 2021-05-28 ENCOUNTER — Other Ambulatory Visit: Payer: Self-pay

## 2021-05-28 ENCOUNTER — Ambulatory Visit (INDEPENDENT_AMBULATORY_CARE_PROVIDER_SITE_OTHER): Payer: Medicare Other | Admitting: Pulmonary Disease

## 2021-05-28 ENCOUNTER — Encounter: Payer: Self-pay | Admitting: Pulmonary Disease

## 2021-05-28 VITALS — BP 120/68 | HR 88 | Temp 97.7°F | Ht 63.0 in | Wt 183.8 lb

## 2021-05-28 DIAGNOSIS — J1282 Pneumonia due to coronavirus disease 2019: Secondary | ICD-10-CM

## 2021-05-28 DIAGNOSIS — R0602 Shortness of breath: Secondary | ICD-10-CM

## 2021-05-28 DIAGNOSIS — U071 COVID-19: Secondary | ICD-10-CM | POA: Diagnosis not present

## 2021-05-28 MED ORDER — ALBUTEROL SULFATE HFA 108 (90 BASE) MCG/ACT IN AERS: 2.0000 | INHALATION_SPRAY | Freq: Four times a day (QID) | RESPIRATORY_TRACT | 5 refills | Status: DC | PRN

## 2021-05-28 MED ORDER — ALBUTEROL SULFATE (2.5 MG/3ML) 0.083% IN NEBU: 2.5000 mg | INHALATION_SOLUTION | Freq: Four times a day (QID) | RESPIRATORY_TRACT | 11 refills | Status: DC | PRN

## 2021-05-28 NOTE — Patient Instructions (Signed)
I am glad you are recovering from your recent hospitalization for heart attack We will renew the albuterol inhaler Please check with cardiology when it is safe for you to resume the rehab program We will reschedule the PFTs in 3 months Return to clinic after PFTs

## 2021-05-28 NOTE — Progress Notes (Signed)
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 Audie Box in [place] who has retired.  She has history of recurrent bronchitis, developed MRSA, E. coli pneumonia diagnosed with bronchoscopy in June 2017.  This was treated as an outpatient Has occasional seasonal allergies and is on albuterol which she uses occasionally. Has occasional snoring, especially when she is fatigued.  She was tested with a sleep apnea around 2015 and was told that she had mild obstructive events only when she was lying supine.  Currently not on treatment with CPAP  Hospitalized for COVID-19 in August of 2022.  Required lengthy treatment with IV steroids, remdesivir and baricitinib.  She required high flow nasal cannula which was weaned down and was discharged on oral prednisone taper and 2 L oxygen at night  She is enrolled in pulmonary rehab with some improvement in symptoms.  She still has some hair loss and is wearing a break Had a follow-up CT showing post-COVID changes.  Echocardiogram was also done for evaluation of enlarged PA on CT scan.  There is no evidence of pulmonary hypertension.  Pets: Dog Occupation: Operates a Medical sales representative shop Exposures: No mold, hot tub, Jacuzzi.  No feather pillows or comforter Smoking history: Never smoker Travel history: No significant travel history Relevant family history: No significant family history of lung disease  Interim history: She was hospitalized in December with inferior ST elevation MI.  Underwent cardiac catheterization with stenting of RCA.  She has followed up with cardiology at [facility].   States that breathing is stable  Outpatient Encounter Medications as of 05/28/2021  Medication Sig   acetaminophen (TYLENOL) 325 MG tablet Take 650 mg by mouth every 6 (six) hours as needed.   albuterol (PROVENTIL) (2.5 MG/3ML) 0.083% nebulizer solution Take 3 mLs (2.5 mg total) by nebulization every 6 (six) hours as needed for wheezing or shortness of breath.   ALPRAZolam (XANAX) 0.25 MG tablet Take 0.25 mg by mouth at bedtime.   Ascorbic Acid (VITAMIN C) 1000 MG tablet Take 2,000 mg by mouth 2 (two) times daily.   aspirin 81 MG chewable tablet Chew 1 tablet (81 mg total) by mouth daily.   atorvastatin (LIPITOR) 20 MG tablet Take 1 tablet (20 mg total) by mouth daily.   B Complex Vitamins (B COMPLEX 50 PO) Take 1 tablet by mouth daily.    budesonide (RHINOCORT AQUA) 32 MCG/ACT nasal spray Place 1 spray into both nostrils daily as needed for rhinitis.   Calcium-Magnesium-Zinc (CAL-MAG-ZINC PO) Take 2 tablets by mouth at bedtime.   Cholecalciferol (VITAMIN D3) 5000 units CAPS Take 5,000 Units by mouth at bedtime.   Coenzyme Q10 (CO Q 10) 100 MG CAPS Take 100 mg by mouth every evening.    diclofenac Sodium (VOLTAREN) 1 % GEL Apply 1 application topically 4 (four) times daily.   Digestive Enzymes (ENZYME DIGEST) CAPS Take 1 capsule by mouth 2 (two) times daily with a meal.    fluconazole (DIFLUCAN) 150 MG tablet Take 150 mg by mouth once.   hydrocortisone (CORTEF) 5 MG tablet Take 5 mg by mouth See admin instructions. TAKE 10MG  IN THE MORNING AND 5 MG IN THE AFTERNOON.  Magnesium 400 MG TABS Take 400 mg by mouth at bedtime.   Multiple Vitamin (MULTIVITAMIN WITH MINERALS) TABS tablet Take 3 tablets by mouth daily. Pure-[place]   nitroGLYCERIN (NITROSTAT) 0.4 MG SL tablet Place 1 tablet (0.4 mg total) under the tongue every 5 (five) minutes as needed.   nystatin (MYCOSTATIN) 500000 units TABS tablet Take 500,000 Units by mouth daily as needed. yeast   nystatin cream (MYCOSTATIN) Apply 1 application  topically 2 (two) times daily as needed for rash.   Omega-3 Fatty Acids (OMEGA 3 PO) Take 2 capsules by mouth daily.   Polyethyl Glycol-Propyl Glycol 0.4-0.3 % SOLN Place 1 drop into both eyes 4 (four) times daily.    polyethylene glycol (MIRALAX / GLYCOLAX) 17 g packet Take 17 g by mouth daily as needed for mild constipation.   Probiotic Product (PROBIOTIC PO) Take 1 capsule by mouth every evening.    thyroid (NP THYROID) 30 MG tablet Take 30 mg by mouth daily before breakfast.   ticagrelor (BRILINTA) 90 MG TABS tablet Take 1 tablet (90 mg total) by mouth 2 (two) times daily.   vitamin A 10000 UNIT capsule Take 10,000 Units by mouth every evening.    albuterol (VENTOLIN HFA) 108 (90 Base) MCG/ACT inhaler Inhale 2 puffs into the lungs every 6 (six) hours as needed for wheezing or shortness of breath. (Patient not taking: Reported on 04/25/2021)   guaiFENesin-dextromethorphan (ROBITUSSIN DM) 100-10 MG/5ML syrup Take 10 mLs by mouth every 4 (four) hours as needed for cough. (Patient not taking: Reported on 04/25/2021)   triamcinolone cream (KENALOG) 0.1 % Apply 1 application topically 3 (three) times daily as needed. Skin irritation (Patient not taking: Reported on 05/28/2021)   [DISCONTINUED] ibuprofen (ADVIL,MOTRIN) 200 MG tablet Take 400 mg by mouth every 6 (six) hours as needed for moderate pain. (Patient not taking: Reported on 04/25/2021)   [DISCONTINUED] thyroid (ARMOUR) 30 MG tablet Take 30-45 mg by mouth See admin instructions. Taking 45 in the AM and 30 mg in the afternoon.   No facility-administered encounter medications on file as of 05/28/2021.    Physical Exam: Blood pressure 120/68, pulse 88, temperature 97.7 F (36.5 C), temperature source Oral, height 5\' 3"  (1.6 m), weight 183 lb 12.8 oz (83.4 kg), SpO2 98 %. Gen:      No acute distress HEENT:  EOMI, sclera anicteric Neck:     No masses; no thyromegaly Lungs:    Clear to auscultation bilaterally; normal respiratory effort CV:          Regular rate and rhythm; no murmurs Abd:      + bowel sounds; soft, non-tender; no palpable masses, no distension Ext:    No edema; adequate peripheral perfusion Skin:      Warm and dry; no rash Neuro: alert and oriented x 3 Psych: normal mood and affect   Data Reviewed: Imaging: Chest x-ray 09/03/2015- mild atelectasis at the left base.   High-resolution CT 06/21/2020-no evidence of interstitial lung disease, mild centrilobular nodularity, 20 pulmonary reduce measuring 4 mm High-res CT 02/01/2021-basilar predominant peripheral groundglass, linear densities consistent with COVID-19.  Enlarged pulmonary artery Chest x-ray 04/01/2021-improvement in bilateral airspace disease I reviewed the images personally.   PFTs: 09/26/2015 FVC 3.40 [116%], FEV1 2.64 [125%], F/F 78 Normal spirometry  Cardiac: Echocardiogram 02/16/2021 LVEF 60-65%, mild LVH, grade 1 diastolic dysfunction.  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  This is new from March 2022 and is consistent with post-COVID changes Continue pulmonary rehab PFTs have been deferred since she had a recent hospitalization for heart attack Reschedule PFTs in 3 months  Continue nocturnal supplemental oxygen for now Will check overnight oximetry in 3 months to see if she can come off oxygen  Enlarged pulmonary artery Noted on CT scan.  Echo reviewed with no evidence of pulmonary hypertension  Bronchitis She has a history of asthma, COPD on record but no objective evidence of obstruction on PFTs Occasionally needs to use albuterol inhaler, not on controller medication  Plan/Recommendations: PFTs and follow-up in 3 months  Marshell Garfinkel MD [facility] 05/28/2021, 11:04 AM  CC: Beola Cord, FNP

## 2021-05-29 ENCOUNTER — Encounter (HOSPITAL_COMMUNITY): Payer: Medicare Other

## 2021-05-31 ENCOUNTER — Encounter (HOSPITAL_COMMUNITY): Payer: Medicare Other

## 2021-06-05 ENCOUNTER — Encounter (HOSPITAL_COMMUNITY): Payer: Medicare Other

## 2021-06-07 ENCOUNTER — Encounter (HOSPITAL_COMMUNITY): Payer: Medicare Other

## 2021-06-12 ENCOUNTER — Encounter (HOSPITAL_COMMUNITY): Payer: Medicare Other

## 2021-06-14 ENCOUNTER — Encounter (HOSPITAL_COMMUNITY): Payer: Medicare Other

## 2021-07-11 ENCOUNTER — Other Ambulatory Visit (HOSPITAL_COMMUNITY)
Admission: RE | Admit: 2021-07-11 | Discharge: 2021-07-11 | Disposition: A | Discharge status: Home / Self Care | Payer: Medicare Other | Source: Ambulatory Visit | Attending: Student | Admitting: Student

## 2021-07-11 ENCOUNTER — Telehealth: Payer: Self-pay | Admitting: *Deleted

## 2021-07-11 DIAGNOSIS — E785 Hyperlipidemia, unspecified: Secondary | ICD-10-CM | POA: Insufficient documentation

## 2021-07-11 LAB — LIPID PANEL
Cholesterol: 171 mg/dL (ref 0–200)
HDL: 45 mg/dL (ref >40)
LDL Cholesterol: 104 mg/dL — ABNORMAL HIGH (ref 0–99)
Total CHOL/HDL Ratio: 3.8 RATIO
Triglycerides: 112 mg/dL (ref <150)
VLDL: 22 mg/dL (ref 0–40)

## 2021-07-11 LAB — HEPATIC FUNCTION PANEL
ALT: 33 U/L (ref 0–44)
AST: 32 U/L (ref 15–41)
Albumin: 4.1 g/dL (ref 3.5–5.0)
Alkaline Phosphatase: 70 U/L (ref 38–126)
Bilirubin, Direct: <0.1 mg/dL (ref 0.0–0.2)
Total Bilirubin: 0.5 mg/dL (ref 0.3–1.2)
Total Protein: 7.8 g/dL (ref 6.5–8.1)

## 2021-07-11 MED ORDER — ROSUVASTATIN CALCIUM 40 MG PO TABS: 40.0000 mg | ORAL_TABLET | Freq: Every day | ORAL | 3 refills | Status: DC

## 2021-07-11 NOTE — Telephone Encounter (Signed)
-----   Message from Ellsworth Lennox, [place] sent at 07/11/2021 12:50 PM EDT ----- ?Please let the patient know that her cholesterol has improved but still remains above goal. Her total cholesterol was previously at 224 and is now down to 171. Her LDL was at 151 and is now down to 104. We prefer for this to be less than 70 given her known coronary artery disease. Liver function is within a normal range. Given that she was previously intolerant to higher doses of Atorvastatin, I would recommend that we stop this and try switching to Crestor 40 mg daily. Recheck Lipids and Liver Function again in 3 months. If unable to tolerate this, please let us know as she may need referral to the Lipid Clinic for consideration of other options if intolerant to multiple statins and LDL remains above goal.  ?

## 2021-07-11 NOTE — Telephone Encounter (Signed)
Pt notified and order placed 

## 2021-07-18 ENCOUNTER — Ambulatory Visit (INDEPENDENT_AMBULATORY_CARE_PROVIDER_SITE_OTHER): Payer: Medicare Other | Admitting: Cardiology

## 2021-07-18 ENCOUNTER — Encounter: Payer: Self-pay | Admitting: Cardiology

## 2021-07-18 VITALS — BP 118/68 | HR 68 | Ht 63.0 in | Wt 184.6 lb

## 2021-07-18 DIAGNOSIS — I2510 Atherosclerotic heart disease of native coronary artery without angina pectoris: Secondary | ICD-10-CM | POA: Diagnosis not present

## 2021-07-18 DIAGNOSIS — E785 Hyperlipidemia, unspecified: Secondary | ICD-10-CM

## 2021-07-18 DIAGNOSIS — U071 COVID-19: Secondary | ICD-10-CM

## 2021-07-18 DIAGNOSIS — I2119 ST elevation (STEMI) myocardial infarction involving other coronary artery of inferior wall: Secondary | ICD-10-CM | POA: Diagnosis not present

## 2021-07-18 DIAGNOSIS — J1282 Pneumonia due to coronavirus disease 2019: Secondary | ICD-10-CM

## 2021-07-18 DIAGNOSIS — Z955 Presence of coronary angioplasty implant and graft: Secondary | ICD-10-CM

## 2021-07-18 DIAGNOSIS — I25119 Atherosclerotic heart disease of native coronary artery with unspecified angina pectoris: Secondary | ICD-10-CM | POA: Diagnosis not present

## 2021-07-18 DIAGNOSIS — R001 Bradycardia, unspecified: Secondary | ICD-10-CM

## 2021-07-18 MED ORDER — ROSUVASTATIN CALCIUM 20 MG PO TABS: 20.0000 mg | ORAL_TABLET | Freq: Every day | ORAL | 6 refills | Status: DC

## 2021-07-18 NOTE — Patient Instructions (Signed)
Medication Instructions:  ? ?START rOSUVASTATIN 20 MG - 1/2 TABLET EVERY OTHER DAY  IF TOLERATED FOR 2 WEEKS  THEN INCREASE TO ONE TABLET DAILY   IF NOT   ?*If you need a refill on your cardiac medications before your next appointment, please call your pharmacy* ? ? ?Lab Work: ?CMP LIPID IN 2 MONTHS  ?  ?If you have labs (blood work) drawn today and your tests are completely normal, you will receive your results only by: ?MyChart Message (if you have MyChart) OR ?A paper copy in the mail ?If you have any lab test that is abnormal or we need to change your treatment, we will call you to review the results. ? ? ?Testing/Procedures: ? ? ? ?Follow-Up: ?At [facility], you and your health needs are our priority.  As part of our continuing mission to provide you with exceptional heart care, we have created designated Provider Care Teams.  These Care Teams include your primary Cardiologist (physician) and Advanced Practice Providers (APPs -  Physician Assistants and Nurse Practitioners) who all work together to provide you with the care you need, when you need it. ? ?  ? ?Your next appointment:   ?4 month(s) ? ?The format for your next appointment:   ?In Person ? ?Provider:   ?Glenetta Hew, MD  ? ? ?You have been referred to  [facility]- LIPID  [facility]  IN 2 MONTHS  ? ?Other Instructions  ?

## 2021-07-18 NOTE — Progress Notes (Signed)
? ? ?Primary Care Provider: Zane Herald, MD ?Cardiologist: Glenetta Hew, MD ?Electrophysiologist: None ? ?Clinic Note: ?Chief Complaint  ?Patient presents with  ? Follow-up  ?  Doing well.  Wants to be referred to PT  ? Coronary Artery Disease  ?  No angina  ? Hyperlipidemia  ?  Despite improvement in lipids, not taking rosuvastatin.  ? ?=================================== ? ?ASSESSMENT/PLAN  ? ?Problem List Items Addressed This Visit   ? ?  ? Cardiology Problems  ? Acute ST elevation myocardial infarction (STEMI) of inferolateral wall (HCC) (Chronic)  ?  3-1/2 months out from her MI.  Doing remarkably well.  Her presentation was somewhat unusual, but her daughter Vaughan Basta was pretty quick and she had preserved EF with no regional wall motion abnormality on echo the next day. ? ?She has underlying lung disease which is being monitored by Dr. Vaughan Browner. ?Has therefore been doing pulmonary rehab as opposed to cardiac rehab, but is doing the same thing.  She is now open to continuing rehab, but would like to do physical therapy for core strengthening and conditioning. ? ?She has a physical therapist in [place] that she would like [place] to refer her to.  We will attempt to make this referral. ?  ?  ? Relevant Medications  ? rosuvastatin (CRESTOR) 20 MG tablet  ? Coronary artery disease involving native heart without angina pectoris (Chronic)  ?  No active anginal symptoms now.  Doing well following her PCI.  Relatively close door to balloon time with preserved EF.  Minimal disease elsewhere. ? ?Plan: ?Not on beta-blocker because of bradycardia as noted. ?With some borderline pressures, she actually has not on blood pressure medication-if pressures become higher, would like to place her on ARB (not ACE inhibitor because of pulmonary issues) and future follow-up visits. ?On DAPT for 1 year. ->  After 1 year we will switch to monotherapy with reduced dose to Brilinta or switch to Plavix 75 mg. ?Intolerant of atorvastatin  with significant myopathy. => We will try starting on low-dose rosuvastatin (listed, but not taking) start with 10 mg daily and increase to 20 if tolerated. ?Plan referral to CVRR for ongoing care. ? ?  ?  ? Relevant Medications  ? rosuvastatin (CRESTOR) 20 MG tablet  ? Hyperlipidemia with target LDL less than 70 (Chronic)  ?  Her lipids did improve with diet.  Adjustments and short-term treatment with statin, however probably could be better and can be close to goal if she were actually taking a statin. ? ?I would like to have her actually start taking rosuvastatin 10 mg daily for 2 weeks and then increase to 20 mg if tolerated.  We will recheck lipids in 2 months and then pending those results and how she is doing her statin, anticipate referral to CVRR for lipid management. ?  ?  ? Relevant Medications  ? rosuvastatin (CRESTOR) 20 MG tablet  ?  ? Other  ? Presence of drug coated stent in right coronary artery (Chronic)  ?  Sizable DES stent in the RCA in the setting of STEMI. => Per protocol, 1 year DAPT with aspirin 81 mg and Brilinta 90 mg twice daily.  Preferably uninterrupted. ?For emergent procedures, obviously would have to hold, however for more urgent procedures after June 2023, would be potentially acceptable to hold. ? ?Otherwise, would continue uninterrupted DAPT through December 2023 ?-> As of January 2024, anticipate converting to Thienopyridine monotherapy with either maintenance dose 60 mg twice daily Brilinta or clopidogrel/Plavix  75 mg daily.  At that point, would be okay to hold for 5 to 7 days preop for surgeries or procedures. ?  ?  ? Pneumonia due to COVID-19 virus  ?  Being followed by Dr. Isaiah Serge.  PFTs pending. ?She has been quite dyspneic since this and is hard to tell if she is having any significant dyspnea from her MI, does not seem to be all that change.  Doing well with pulmonary rehab.  Hoping to go through with regular PT ?  ?  ? Bradycardia  ?  Noted on monitor.  Mostly during  sleeping hours.  Thought to be benign when reviewed by EP.  Simply holding off on AV nodal agents. ?  ?  ? ?Other Visit Diagnoses   ? ? ST elevation myocardial infarction (STEMI) of inferior wall, subsequent episode of care [facility])    -  Primary  ? Relevant Medications  ? rosuvastatin (CRESTOR) 20 MG tablet  ? Other Relevant Orders  ? AMB Referral to [facility]  ? Lipid panel  ? Comprehensive metabolic panel  ? Ambulatory referral to Physical Therapy  ? Hyperlipidemia LDL goal <70      ? Relevant Medications  ? rosuvastatin (CRESTOR) 20 MG tablet  ? Other Relevant Orders  ? AMB Referral to [facility] Pharm[facility]  ? Lipid panel  ? Comprehensive metabolic panel  ? Ambulatory referral to Physical Therapy  ? Coronary artery disease involving native coronary artery of native heart with angina pectoris (HCC)      ? Relevant Medications  ? rosuvastatin (CRESTOR) 20 MG tablet  ? Other Relevant Orders  ? Ambulatory referral to Physical Therapy  ? ?  ? ?= Referral Placed to [facility] - PT in [place], [place]. ? ?=================================== ? ?HPI:   ? ?SIRA MIELKE is a 70 y.o. female with a PMH notable for recent INFERIOR STEMI-RCA PCI (04/01/2021) who presents today for 50-month follow-up.  She has been seen here today at the request of Tacy Learn, FNP. ? ?04/01/2021: Inferior STEMI: 100% proximal-mid RCA-DES PCI.  EF 65 to 70%. ?Zio patch placed because of bradycardia, no beta-blocker. ? ?MAETTA BRANDSTETTER was last seen on April 25, 2021 by Randall An, PA for posthospital follow-up after STEMI => was doing well, just did not tolerate high-dose atorvastatin.  She tried taking lower dose of atorvastatin and still had issues with myalgias. => Was written to start rosuvastatin, but never did.  She continued doing Pulmonary Rehab because her COVID 19 infection issues, and has continued doing it since her MI.  Was told that she was doing the same exercise that cardiac rehab is doing. ? ?Recent  Hospitalizations: None since her MI ? ?Reviewed  CV studies:   ? ?The following studies were reviewed today: (if available, images/films reviewed: From Epic Chart or Care Everywhere) ? ?Cath-PCI: 04/02/2021:  Severe single-vessel CAD with 100% thrombotic occlusion of the proximal RCA; ?Successful DES PCI of the proximal RCA using 2.75 mm x 30 mm DES stent postdilated to 3.1 mm. ?Preserved LVEF; ?Borderline hypotension requiring Levophed infusion. ? ?Echo 04/02/2021: (Immediately post Inferior STEMI) hyperdynamic LV, EF 65 to 70%.  No RWMA.  Moderate basal septal hypertrophy.  Indeterminate diastolic pressures (grade 1 diastolic function).  Mildly reduced RV function but unable assess PAP.  RAP estimated at 15 mmHg with mild right atrial dilation.  Normal mitral and aortic valves. ? ?7d-Zio patch monitor (12/22-29/2023): Predominant rhythm: Sinus-rate range 30-82 bpm, average 48 bpm.  One 3.1-second pause  at 8 AM.  Sinus bradycardia with junctional escape beats down to 28 bpm noted between 630 and 7:30 AM.  Junctional rhythm bradycardia noted at 2 AM.  Occasional PACs (2.2%) with 2 short atrial runs fastest and longest was 7 beats at 128 bpm. ?Reviewed with EP.  Because of bradycardia not considered to be significant given the fact that they were during hours of sleep.-Avoid AV nodal agents. ?. ?Interval History:  ? ?Hillary Bow returns here today for roughly 46-month follow-up doing fairly well from a cardiac standpoint.  She has been doing Cardiopulmonary Rehab up in [place], but was told that she is basically stating the cardiac rehab is doing.   ?She is doing fine with no recurrent chest pain or pressure with rest or exertion.  She denies any heart failure symptoms of PND, orthopnea or edema.  Mild intermittent dyspnea that was thought to be related to Brilinta has notably improved.  She is in the process of undergoing pulmonary evaluation with PFTs ordered by Dr. Vaughan Browner. ? ?She never did convert from  atorvastatin to rosuvastatin ordered by Bernerd Pho, [place]. ? ?CV Review of Symptoms (Summary) ?Cardiovascular ROS: positive for - dyspnea on exertion, shortness of breath, and this is pretty much stable since prior to

## 2021-07-20 ENCOUNTER — Emergency Department (HOSPITAL_COMMUNITY): Payer: Medicare Other

## 2021-07-20 ENCOUNTER — Emergency Department (HOSPITAL_COMMUNITY)
Admission: EM | Admit: 2021-07-20 | Discharge: 2021-07-20 | Disposition: A | Discharge status: Home / Self Care | Payer: Medicare Other | Attending: Emergency Medicine | Admitting: Emergency Medicine

## 2021-07-20 ENCOUNTER — Encounter (HOSPITAL_COMMUNITY): Payer: Self-pay | Admitting: *Deleted

## 2021-07-20 DIAGNOSIS — S7011XA Contusion of right thigh, initial encounter: Secondary | ICD-10-CM | POA: Insufficient documentation

## 2021-07-20 DIAGNOSIS — S40021A Contusion of right upper arm, initial encounter: Secondary | ICD-10-CM | POA: Insufficient documentation

## 2021-07-20 DIAGNOSIS — Z7982 Long term (current) use of aspirin: Secondary | ICD-10-CM | POA: Insufficient documentation

## 2021-07-20 DIAGNOSIS — W19XXXA Unspecified fall, initial encounter: Secondary | ICD-10-CM

## 2021-07-20 DIAGNOSIS — I2510 Atherosclerotic heart disease of native coronary artery without angina pectoris: Secondary | ICD-10-CM | POA: Insufficient documentation

## 2021-07-20 DIAGNOSIS — W01198A Fall on same level from slipping, tripping and stumbling with subsequent striking against other object, initial encounter: Secondary | ICD-10-CM | POA: Diagnosis not present

## 2021-07-20 DIAGNOSIS — S79921A Unspecified injury of right thigh, initial encounter: Secondary | ICD-10-CM | POA: Diagnosis present

## 2021-07-20 MED ORDER — ACETAMINOPHEN 325 MG PO TABS
650.0000 mg | ORAL_TABLET | Freq: Once | ORAL | Status: AC
  Administered 2021-07-20: 650 mg via ORAL
  Filled 2021-07-20: qty 2

## 2021-07-20 MED ORDER — TRAMADOL HCL 50 MG PO TABS: 50.0000 mg | ORAL_TABLET | Freq: Four times a day (QID) | ORAL | 0 refills | Status: DC | PRN

## 2021-07-20 NOTE — ED Triage Notes (Signed)
Fell, pain in right hip and right arm ?

## 2021-07-20 NOTE — ED Notes (Signed)
Patient transported to CT 

## 2021-07-20 NOTE — ED Provider Notes (Signed)
?[facility] ?Provider Note ? ? ?CSN: [id number] ?Arrival date & time: 07/20/21  1531 ? ?  ? ?History ? ?Chief Complaint  ?Patient presents with  ? Fall  ? ? ?Monica GlazierRebecca L Harrison is a 71 y.o. female. ? ? ?Fall ?Pertinent negatives include no chest pain, no abdominal pain, no headaches and no shortness of breath.  ? ?  ? ?Monica GlazierRebecca L Harrison is a 71 y.o. female with past medical history that includes adrenal sufficiency, hyperlipidemia, and coronary artery disease.  Anticoagulated on Brilinta who presents to the Emergency Department complaining of right hip pain, swelling and right upper arm pain symptoms secondary to a mechanical fall that occurred just prior to arrival.  She was removing her wet shoes when she slipped falling onto hardwood floor.  Reports immediate swelling of her lateral hip and a "knot" to her upper arm.  Denies head injury, neck pain, headache, dizziness and LOC.  ? ? ? ?Home Medications ?Prior to Admission medications   ?Medication Sig Start Date End Date Taking? Authorizing Provider  ?acetaminophen (TYLENOL) 325 MG tablet Take 650 mg by mouth every 6 (six) hours as needed.    [provider]  ?albuterol (PROVENTIL) (2.5 MG/3ML) 0.083% nebulizer solution Take 3 mLs (2.5 mg total) by nebulization every 6 (six) hours as needed for wheezing or shortness of breath. 05/28/21   Mannam, Colbert CoyerPraveen, MD  ?albuterol (VENTOLIN HFA) 108 (90 Base) MCG/ACT inhaler Inhale 2 puffs into the lungs every 6 (six) hours as needed for wheezing or shortness of breath. 05/28/21   Mannam, Colbert CoyerPraveen, MD  ?ALPRAZolam Prudy Feeler(XANAX) 0.25 MG tablet Take 0.25 mg by mouth at bedtime.    [provider]  ?Ascorbic Acid (VITAMIN C) 1000 MG tablet Take 2,000 mg by mouth 2 (two) times daily.    [provider]  ?aspirin 81 MG chewable tablet Chew 1 tablet (81 mg total) by mouth daily. 04/04/21   Arty Baumgartneroberts, Lindsay B, NP  ?B Complex Vitamins (B COMPLEX 50 PO) Take 1 tablet by mouth daily.     [provider]  ?budesonide (RHINOCORT AQUA) 32 MCG/ACT nasal spray Place 1 spray into both nostrils daily as needed for rhinitis.    [provider]  ?Calcium-Magnesium-Zinc (CAL-MAG-ZINC PO) Take 2 tablets by mouth at bedtime.    [provider]  ?Cholecalciferol (VITAMIN D3) 5000 units CAPS Take 5,000 Units by mouth at bedtime.    [provider]  ?Coenzyme Q10 (CO Q 10) 100 MG CAPS Take 100 mg by mouth every evening.     [provider]  ?diclofenac Sodium (VOLTAREN) 1 % GEL Apply 1 application topically 4 (four) times daily. 04/13/21   [provider]  ?Digestive Enzymes (ENZYME DIGEST) CAPS Take 1 capsule by mouth 2 (two) times daily with a meal.     [provider]  ?fluconazole (DIFLUCAN) 150 MG tablet Take 150 mg by mouth as needed. 02/05/21   [provider]  ?guaiFENesin-dextromethorphan (ROBITUSSIN DM) 100-10 MG/5ML syrup Take 10 mLs by mouth every 4 (four) hours as needed for cough. ?Patient not taking: Reported on 04/25/2021 11/28/20   Maurilio LovelyShah, Pratik D, DO  ?hydrocortisone (CORTEF) 5 MG tablet Take 5 mg by mouth See admin instructions. TAKE 10MG  IN THE MORNING AND 5 MG IN THE AFTERNOON.    [provider]  ?Magnesium 400 MG TABS Take 400 mg by mouth at bedtime.    [provider]  ?Multiple Vitamin (MULTIVITAMIN WITH MINERALS) TABS tablet Take 3 tablets  by mouth daily. Pure-[place]    [provider]  ?nitroGLYCERIN (NITROSTAT) 0.4 MG SL tablet Place 1 tablet (0.4 mg total) under the tongue every 5 (five) minutes as needed. 04/03/21   Arty Baumgartner, NP  ?nystatin (MYCOSTATIN) 500000 units TABS tablet Take 500,000 Units by mouth daily as needed. yeast 03/06/21   [provider]  ?nystatin cream (MYCOSTATIN) Apply 1 application topically 2 (two) times daily as needed for rash. ?Patient not taking: Reported on 07/18/2021 03/06/21   [provider]  ?Omega-3 Fatty Acids (OMEGA 3 PO) Take 2 capsules by  mouth daily.    [provider]  ?OXYGEN Inhale into the lungs. 2L only at night    [provider]  ?Polyethyl Glycol-Propyl Glycol 0.4-0.3 % SOLN Place 1 drop into both eyes 4 (four) times daily.     [provider]  ?polyethylene glycol (MIRALAX / GLYCOLAX) 17 g packet Take 17 g by mouth daily as needed for mild constipation.    [provider]  ?Probiotic Product (PROBIOTIC PO) Take 1 capsule by mouth every evening.     [provider]  ?rosuvastatin (CRESTOR) 20 MG tablet Take 1 tablet (20 mg total) by mouth daily. 07/18/21 10/16/21  Marykay Lex, MD  ?thyroid (ARMOUR) 30 MG tablet Take 30 mg by mouth daily before breakfast.    [provider]  ?ticagrelor (BRILINTA) 90 MG TABS tablet Take 1 tablet (90 mg total) by mouth 2 (two) times daily. 04/25/21   Strader, Lennart Pall, PA-C  ?triamcinolone cream (KENALOG) 0.1 % Apply 1 application topically 3 (three) times daily as needed. Skin irritation ?Patient not taking: Reported on 05/28/2021 03/06/21   [provider]  ?vitamin A 42353 UNIT capsule Take 10,000 Units by mouth every evening.     [provider]  ?   ? ?Allergies    ?Avelox [moxifloxacin hcl in nacl], Biaxin [clarithromycin], Lactose intolerance (gi), Tobramycin, and Yeast-related products   ? ?Review of Systems   ?Review of Systems  ?Constitutional:  Negative for chills and fever.  ?Eyes:  Negative for visual disturbance.  ?Respiratory:  Negative for shortness of breath.   ?Cardiovascular:  Negative for chest pain.  ?Gastrointestinal:  Negative for abdominal pain, nausea and vomiting.  ?Musculoskeletal:  Positive for arthralgias (right hip pain and swelling.  pain right upper arm).  ?Skin:   ?     Bruising upper arm and right hip  ?Neurological:  Negative for dizziness, speech difficulty, weakness, numbness and headaches.  ?All other systems reviewed and are negative. ? ?Physical Exam ?Updated Vital Signs ?BP 136/78   Pulse 77    Temp 97.6 ?F (36.4 ?C) (Oral)   Resp 18   Ht 5\' 3"  (1.6 m)   Wt 83.5 kg   SpO2 96%   BMI 32.59 kg/m?  ?Physical Exam ?Vitals and nursing note reviewed.  ?Constitutional:   ?   General: She is not in acute distress. ?   Appearance: Normal appearance. She is not toxic-appearing.  ?HENT:  ?   Head: Atraumatic.  ?Eyes:  ?   Extraocular Movements: Extraocular movements intact.  ?   Conjunctiva/sclera: Conjunctivae normal.  ?   Pupils: Pupils are equal, round, and reactive to light.  ?Cardiovascular:  ?   Rate and Rhythm: Normal rate and regular rhythm.  ?   Pulses: Normal pulses.  ?Pulmonary:  ?   Effort: Pulmonary effort is normal. No respiratory distress.  ?Chest:  ?   Chest wall: No tenderness.  ?  Abdominal:  ?   Palpations: Abdomen is soft.  ?   Tenderness: There is no abdominal tenderness.  ?Musculoskeletal:     ?   General: Swelling, tenderness and signs of injury present.  ?   Cervical back: Normal range of motion. No tenderness.  ?   Comments: Tenderness to palp of the lateral right hip, 12 cm hematoma of the lateral right thigh.  No open wounds.  4 cm hematoma noted to medial aspect of the right upper arm.  No bony deformity and pt has full ROM of shoulder and elbow without pain  ?Skin: ?   General: Skin is warm.  ?   Capillary Refill: Capillary refill takes less than 2 seconds.  ?   Findings: Bruising present.  ?   Comments: Ecchymosis right upper arm and lateral right thigh  ?Neurological:  ?   General: No focal deficit present.  ?   Mental Status: She is alert.  ?   Sensory: No sensory deficit.  ?   Motor: No weakness.  ? ? ?ED Results / Procedures / Treatments   ?Labs ?(all labs ordered are listed, but only abnormal results are displayed) ?Labs Reviewed - No data to display ? ?EKG ?None ? ?Radiology ?CT Hip Right Wo Contrast ? ?Result Date: 07/20/2021 ?CLINICAL DATA:  Hip trauma, fracture suspected. Casimiro Needle 5 by go to places some places right odontoideum enjoy a little bit for 10 20 minutes RAD E right are  RAD EXAM: CT OF THE RIGHT HIP WITHOUT CONTRAST TECHNIQUE: Multidetector CT imaging of the right hip was performed according to the standard protocol. Multiplanar CT image reconstructions were also generated. RADIATIO

## 2021-07-20 NOTE — Discharge Instructions (Signed)
Your x-rays and CTs today did not show evidence of any broken bones.  I recommend that you apply ice packs on and off to your thigh and arm to help reduce swelling.  You may take the tramadol as directed if needed for pain.  Please use your walker for the next several days to a week.  Follow-up with your primary care provider for recheck, return to the emergency department for any new or worsening symptoms. ?

## 2021-07-22 ENCOUNTER — Encounter: Payer: Self-pay | Admitting: Cardiology

## 2021-07-22 NOTE — Assessment & Plan Note (Signed)
3-1/2 months out from her MI.  Doing remarkably well.  Her presentation was somewhat unusual, but her daughter Bonita Quin was pretty quick and she had preserved EF with no regional wall motion abnormality on echo the next day. ? ?She has underlying lung disease which is being monitored by Dr. Isaiah Serge. ?Has therefore been doing pulmonary rehab as opposed to cardiac rehab, but is doing the same thing.  She is now open to continuing rehab, but would like to do physical therapy for core strengthening and conditioning. ? ?She has a physical therapist in [place] that she would like [place] to refer her to.  We will attempt to make this referral. ?

## 2021-07-22 NOTE — Assessment & Plan Note (Signed)
Being followed by Dr. Isaiah Serge.  PFTs pending. ?She has been quite dyspneic since this and is hard to tell if she is having any significant dyspnea from her MI, does not seem to be all that change.  Doing well with pulmonary rehab.  Hoping to go through with regular PT ?

## 2021-07-22 NOTE — Assessment & Plan Note (Signed)
Her lipids did improve with diet.  Adjustments and short-term treatment with statin, however probably could be better and can be close to goal if she were actually taking a statin. ? ?I would like to have her actually start taking rosuvastatin 10 mg daily for 2 weeks and then increase to 20 mg if tolerated.  We will recheck lipids in 2 months and then pending those results and how she is doing her statin, anticipate referral to [facility] for lipid management. ?

## 2021-07-22 NOTE — Assessment & Plan Note (Signed)
No active anginal symptoms now.  Doing well following her PCI.  Relatively close door to balloon time with preserved EF.  Minimal disease elsewhere. ? ?Plan: ?? Not on beta-blocker because of bradycardia as noted. ?? With some borderline pressures, she actually has not on blood pressure medication-if pressures become higher, would like to place her on ARB (not ACE inhibitor because of pulmonary issues) and future follow-up visits. ?? On DAPT for 1 year. ->  After 1 year we will switch to monotherapy with reduced dose to Brilinta or switch to Plavix 75 mg. ?? Intolerant of atorvastatin with significant myopathy. => We will try starting on low-dose rosuvastatin (listed, but not taking) start with 10 mg daily and increase to 20 if tolerated. ?? Plan referral to [facility] for ongoing care. ? ?

## 2021-07-22 NOTE — Assessment & Plan Note (Signed)
Sizable DES stent in the RCA in the setting of STEMI. => Per protocol, 1 year DAPT with aspirin 81 mg and Brilinta 90 mg twice daily.  Preferably uninterrupted. ?For emergent procedures, obviously would have to hold, however for more urgent procedures after June 2023, would be potentially acceptable to hold. ? ?Otherwise, would continue uninterrupted DAPT through December 2023 ?-> As of January 2024, anticipate converting to Thienopyridine monotherapy with either maintenance dose 60 mg twice daily Brilinta or clopidogrel/Plavix 75 mg daily.  At that point, would be okay to hold for 5 to 7 days preop for surgeries or procedures. ?

## 2021-07-22 NOTE — Assessment & Plan Note (Signed)
Noted on monitor.  Mostly during sleeping hours.  Thought to be benign when reviewed by EP.  Simply holding off on AV nodal agents. ?

## 2021-07-24 ENCOUNTER — Ambulatory Visit (INDEPENDENT_AMBULATORY_CARE_PROVIDER_SITE_OTHER): Payer: Medicare Other | Admitting: Primary Care

## 2021-07-24 ENCOUNTER — Ambulatory Visit (INDEPENDENT_AMBULATORY_CARE_PROVIDER_SITE_OTHER): Payer: Medicare Other

## 2021-07-24 ENCOUNTER — Encounter: Payer: Self-pay | Admitting: Primary Care

## 2021-07-24 VITALS — BP 122/70 | HR 84 | Ht 63.0 in | Wt 181.4 lb

## 2021-07-24 DIAGNOSIS — J42 Unspecified chronic bronchitis: Secondary | ICD-10-CM

## 2021-07-24 DIAGNOSIS — I2510 Atherosclerotic heart disease of native coronary artery without angina pectoris: Secondary | ICD-10-CM

## 2021-07-24 DIAGNOSIS — J40 Bronchitis, not specified as acute or chronic: Secondary | ICD-10-CM | POA: Insufficient documentation

## 2021-07-24 DIAGNOSIS — R059 Cough, unspecified: Secondary | ICD-10-CM

## 2021-07-24 MED ORDER — AMOXICILLIN-POT CLAVULANATE 875-125 MG PO TABS: 1.0000 | ORAL_TABLET | Freq: Two times a day (BID) | ORAL | 0 refills | Status: DC

## 2021-07-24 NOTE — Patient Instructions (Addendum)
Recommendations: ?Continue Augmentin 1 tab twice daily for 1 week ?CXR today ? ?Follow-up: ?If cough/congestion do not improve  ?

## 2021-07-24 NOTE — Assessment & Plan Note (Signed)
-   Following with Dr. Herbie Baltimore with heartcare ?

## 2021-07-24 NOTE — Assessment & Plan Note (Addendum)
-   Patient developed chest congestion with productive cough 2-3 days ago. She has a hx of post covid fibrosis. She had scattered rhonchi to left base on exam. Checking CXR. Sending in Rx Augmentin 1 tab twice x 7 days. Start mucinex 600mg  twice daily as needed for chest congestion, continue albuterol 2 puffs every 4-6 hours as needed for sob/wheezing. FU if cough does not improve/worsens.  ?

## 2021-07-24 NOTE — Progress Notes (Signed)
? ?@Patient  ID: , female    DOB: Jun 18, 1950, 72 y.o.   MRN: [id number] ? ?Chief Complaint  ?Patient presents with  ? Follow-up  ?  Congestion   ? ? ?Referring provider: ?[id number], MD ? ?HPI: ?71 year old female, never smoked.  Past medical history significant for coronary artery disease, pneumonia due to COVID-19, acute hypoxemic respiratory failure, hyperlipidemia. Patient of Dr. 62, last seen on 05/28/21.  ? ?07/24/2021 ?Patient presents today for acute overview due to chest congestion. She fell Friday March 7th. States that it was raining and she slipped on her wood floors and sustained a large hematoma to her right hip. She developed chest congestion on Sunday. Having to clear her throat often, coughing up some sputum. She took left over Augmentin he had on hand x 2 days. She is starting to feel some better. No associated shortness of breath, chest tightness or wheezing.  ?\ ? ?Allergies  ?Allergen Reactions  ? Avelox [Moxifloxacin Hcl In Nacl] Anaphylaxis  ? Biaxin [Clarithromycin] Anaphylaxis  ? Lactose Intolerance (Gi) Other (See Comments)  ?  Bloating, gas, congestion  ? Tobramycin Other (See Comments)  ?  Severe headaches   ? Yeast-Related Products Swelling  ?  Tongue swelling /candida  ? ? ? ?There is no immunization history on file for this patient. ? ?Past Medical History:  ?Diagnosis Date  ? Adrenal insufficiency (HCC)   ? Bronchitis, mucopurulent recurrent (HCC)   ? CAD (coronary artery disease), native coronary artery 04/01/2021  ? a. s/p Inferior STEMI in 03/2021: 100% p-mRCA (DES PCI); Residual 10% prox LCx stenosis and 20% mid LCx stenosis  ? Hyperlipidemia   ? MRSA pneumonia (HCC) 09/2015  ? Associated with a E. coli pneumonia-diagnosed by bronchoscopy.  ? OSA (obstructive sleep apnea) 2015  ? Sleep study showed mild obstructive events only when lying supine => not on CPAP.  ? Pneumonia due to COVID-19 virus 11/2018  ? Hospitalized August 2022 for COVID-19-required lengthy  treatment with IV steroids along with remdesivir and baricitinib.  She eventually discharged home on oral prednisone taper and nightly 2 L Emma oxygen. 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 Prediabetes   ? STEMI (ST elevation myocardial infarction) (HCC)   ? 100% prox-mid RCA - DES PCI (DES 2.75 mm x 30 mm DES stent postdilated to 3.1 mm)  ? Thyroid disease   ? ? ?Tobacco History: ?Social History  ? ?Tobacco Use  ?Smoking Status Never  ?Smokeless Tobacco Never  ? ?Counseling given: Not Answered ? ? ?Outpatient Medications Prior to Visit  ?Medication Sig Dispense Refill  ? acetaminophen (TYLENOL) 325 MG tablet Take 650 mg by mouth every 6 (six) hours as needed.    ? albuterol (PROVENTIL) (2.5 MG/3ML) 0.083% nebulizer solution Take 3 mLs (2.5 mg total) by nebulization every 6 (six) hours as needed for wheezing or shortness of breath. 140 mL 11  ? albuterol (VENTOLIN HFA) 108 (90 Base) MCG/ACT inhaler Inhale 2 puffs into the lungs every 6 (six) hours as needed for wheezing or shortness of breath. 8 g 5  ? ALPRAZolam (XANAX) 0.25 MG tablet Take 0.25 mg by mouth at bedtime.    ? Ascorbic Acid (VITAMIN C) 1000 MG tablet Take 2,000 mg by mouth 2 (two) times daily.    ? aspirin 81 MG chewable tablet Chew 1 tablet (81 mg total) by mouth daily. 90 tablet 2  ? B Complex Vitamins (B COMPLEX 50 PO) Take  1 tablet by mouth daily.     ? budesonide (RHINOCORT AQUA) 32 MCG/ACT nasal spray Place 1 spray into both nostrils daily as needed for rhinitis.    ? Calcium-Magnesium-Zinc (CAL-MAG-ZINC PO) Take 2 tablets by mouth at bedtime.    ? Cholecalciferol (VITAMIN D3) 5000 units CAPS Take 5,000 Units by mouth at bedtime.    ? Coenzyme Q10 (CO Q 10) 100 MG CAPS Take 100 mg by mouth every evening.     ? Digestive Enzymes (ENZYME DIGEST) CAPS Take 1 capsule by mouth 2 (two) times daily with a meal.     ? fluconazole (DIFLUCAN) 150 MG tablet Take 150 mg by mouth as needed.    ?  guaiFENesin-dextromethorphan (ROBITUSSIN DM) 100-10 MG/5ML syrup Take 10 mLs by mouth every 4 (four) hours as needed for cough. 118 mL 0  ? hydrocortisone (CORTEF) 5 MG tablet Take 5 mg by mouth See admin instructions. TAKE 10MG  IN THE MORNING AND 5 MG IN THE AFTERNOON.    ? Magnesium 400 MG TABS Take 400 mg by mouth at bedtime.    ? Multiple Vitamin (MULTIVITAMIN WITH MINERALS) TABS tablet Take 3 tablets by mouth daily. Pure-[place]    ? nitroGLYCERIN (NITROSTAT) 0.4 MG SL tablet Place 1 tablet (0.4 mg total) under the tongue every 5 (five) minutes as needed. 25 tablet 2  ? nystatin (MYCOSTATIN) 500000 units TABS tablet Take 500,000 Units by mouth daily as needed. yeast    ? nystatin cream (MYCOSTATIN) Apply 1 application. topically 2 (two) times daily as needed for rash.    ? Omega-3 Fatty Acids (OMEGA 3 PO) Take 2 capsules by mouth daily.    ? OXYGEN Inhale into the lungs. 2L only at night    ? Polyethyl Glycol-Propyl Glycol 0.4-0.3 % SOLN Place 1 drop into both eyes 4 (four) times daily.     ? polyethylene glycol (MIRALAX / GLYCOLAX) 17 g packet Take 17 g by mouth daily as needed for mild constipation.    ? Probiotic Product (PROBIOTIC PO) Take 1 capsule by mouth every evening.     ? thyroid (ARMOUR) 30 MG tablet Take 30 mg by mouth daily before breakfast.    ? ticagrelor (BRILINTA) 90 MG TABS tablet Take 1 tablet (90 mg total) by mouth 2 (two) times daily. 60 tablet 11  ? vitamin A UNIT capsule Take 10,000 Units by mouth every evening.     ? diclofenac Sodium (VOLTAREN) 1 % GEL Apply 1 application topically 4 (four) times daily. (Patient not taking: Reported on 07/24/2021)    ? rosuvastatin (CRESTOR) 20 MG tablet Take 1 tablet (20 mg total) by mouth daily. (Patient not taking: Reported on 07/24/2021) 30 tablet 6  ? traMADol (ULTRAM) 50 MG tablet Take 1 tablet (50 mg total) by mouth every 6 (six) hours as needed. (Patient not taking: Reported on 07/24/2021) 12 tablet 0  ? triamcinolone cream (KENALOG) 0.1 % Apply  1 application topically 3 (three) times daily as needed. Skin irritation (Patient not taking: Reported on 07/24/2021)    ? ?No facility-administered medications prior to visit.  ? ? ? ? ?Review of Systems ? ?Review of Systems  ?Constitutional: Negative.   ?HENT:  Positive for congestion.   ?Respiratory:  Positive for cough. Negative for chest tightness, shortness of breath and wheezing.   ? ? ?Physical Exam ? ?BP 122/70 (BP Location: Left Arm, Cuff Size: Normal)   Pulse 84   Ht 5\' 3"  (1.6 m)   Wt 181 lb  6.4 oz (82.3 kg)   SpO2 98%   BMI 32.13 kg/m?  ?Physical Exam ?Constitutional:   ?   General: She is not in acute distress. ?   Appearance: Normal appearance. She is not ill-appearing.  ?HENT:  ?   Head: Normocephalic and atraumatic.  ?   Mouth/Throat:  ?   Mouth: Mucous membranes are moist.  ?   Pharynx: Oropharynx is clear.  ?Cardiovascular:  ?   Rate and Rhythm: Normal rate and regular rhythm.  ?Pulmonary:  ?   Effort: Pulmonary effort is normal.  ?   Breath sounds: Normal breath sounds. No rales.  ?   Comments: ? Possible rhonchi left base ?Musculoskeletal:     ?   General: Normal range of motion.  ?Skin: ?   General: Skin is warm and dry.  ?Neurological:  ?   General: No focal deficit present.  ?   Mental Status: She is alert and oriented to person, place, and time. Mental status is at baseline.  ?Psychiatric:     ?   Mood and Affect: Mood normal.     ?   Behavior: Behavior normal.     ?   Thought Content: Thought content normal.     ?   Judgment: Judgment normal.  ?  ? ?Lab Results: ? ?CBC ?   ?Component Value Date/Time  ? WBC 12.0 (H) 04/02/2021 0035  ? RBC 3.96 04/02/2021 0035  ? HGB 12.0 04/02/2021 0035  ? HCT 35.2 (L) 04/02/2021 0035  ? PLT 223 04/02/2021 0035  ? MCV 88.9 04/02/2021 0035  ? MCH 30.3 04/02/2021 0035  ? MCHC 34.1 04/02/2021 0035  ? RDW 13.1 04/02/2021 0035  ? LYMPHSABS 3.5 04/01/2021 1053  ? MONOABS 1.1 (H) 04/01/2021 1053  ? EOSABS 0.2 04/01/2021 1053  ? BASOSABS 0.1 04/01/2021 1053   ? ? ?BMET ?   ?Component Value Date/Time  ? NA 135 04/02/2021 0527  ? K 4.4 04/02/2021 0527  ? CL 105 04/02/2021 0527  ? CO2 21 (L) 04/02/2021 0527  ? GLUCOSE 137 (H) 04/02/2021 0527  ? BUN 18 04/02/2021 052

## 2021-07-24 NOTE — Progress Notes (Signed)
Please let patient know CXR showed no acute process, continued improvement of previous covid infection. Take augmentin as prescribed for bronchitis symptoms

## 2021-07-25 MED ORDER — FLUCONAZOLE 150 MG PO TABS: ORAL_TABLET | ORAL | 0 refills | Status: DC

## 2021-07-25 NOTE — Addendum Note (Signed)
Addended by: Glenford Bayley on: 07/25/2021 08:57 AM ? ? Modules accepted: Orders ? ?

## 2021-08-31 ENCOUNTER — Encounter: Payer: Self-pay | Admitting: Pulmonary Disease

## 2021-08-31 ENCOUNTER — Ambulatory Visit (INDEPENDENT_AMBULATORY_CARE_PROVIDER_SITE_OTHER): Payer: Medicare Other | Admitting: Pulmonary Disease

## 2021-08-31 VITALS — BP 116/68 | HR 81 | Ht 63.0 in | Wt 182.0 lb

## 2021-08-31 DIAGNOSIS — R0602 Shortness of breath: Secondary | ICD-10-CM | POA: Diagnosis not present

## 2021-08-31 DIAGNOSIS — R5383 Other fatigue: Secondary | ICD-10-CM

## 2021-08-31 DIAGNOSIS — I25119 Atherosclerotic heart disease of native coronary artery with unspecified angina pectoris: Secondary | ICD-10-CM | POA: Diagnosis not present

## 2021-08-31 DIAGNOSIS — J40 Bronchitis, not specified as acute or chronic: Secondary | ICD-10-CM

## 2021-08-31 DIAGNOSIS — U071 COVID-19: Secondary | ICD-10-CM | POA: Diagnosis not present

## 2021-08-31 DIAGNOSIS — G479 Sleep disorder, unspecified: Secondary | ICD-10-CM

## 2021-08-31 DIAGNOSIS — J1282 Pneumonia due to coronavirus disease 2019: Secondary | ICD-10-CM

## 2021-08-31 LAB — PULMONARY FUNCTION TEST
DL/VA % pred: 108 %
DL/VA: 4.51 ml/min/mmHg/L
DLCO cor % pred: 92 %
DLCO cor: 17.32 ml/min/mmHg
DLCO unc % pred: 92 %
DLCO unc: 17.32 ml/min/mmHg
FEF 25-75 Post: 1.6 L/sec
FEF 25-75 Pre: 1.14 L/sec
FEF2575-%Change-Post: 39 %
FEF2575-%Pred-Post: 91 %
FEF2575-%Pred-Pre: 65 %
FEV1-%Change-Post: 23 %
FEV1-%Pred-Post: 88 %
FEV1-%Pred-Pre: 71 %
FEV1-Post: 1.86 L
FEV1-Pre: 1.5 L
FEV1FVC-%Change-Post: 12 %
FEV1FVC-%Pred-Pre: 78 %
FEV6-%Change-Post: 10 %
FEV6-%Pred-Post: 103 %
FEV6-%Pred-Pre: 93 %
FEV6-Post: 2.76 L
FEV6-Pre: 2.5 L
FEV6FVC-%Pred-Post: 104 %
FEV6FVC-%Pred-Pre: 104 %
FVC-%Change-Post: 9 %
FVC-%Pred-Post: 98 %
FVC-%Pred-Pre: 89 %
FVC-Post: 2.76 L
FVC-Pre: 2.52 L
Post FEV1/FVC ratio: 67 %
Post FEV6/FVC ratio: 100 %
Pre FEV1/FVC ratio: 60 %
Pre FEV6/FVC Ratio: 100 %
RV % pred: 103 %
RV: 2.26 L
TLC % pred: 103 %
TLC: 5.05 L

## 2021-08-31 LAB — COMPREHENSIVE METABOLIC PANEL
ALT: 29 IU/L (ref 0–32)
AST: 33 IU/L (ref 0–40)
Albumin/Globulin Ratio: 1.7 (ref 1.2–2.2)
Albumin: 4.4 g/dL (ref 3.7–4.7)
Alkaline Phosphatase: 66 IU/L (ref 44–121)
BUN/Creatinine Ratio: 28 (ref 12–28)
BUN: 19 mg/dL (ref 8–27)
Bilirubin Total: 0.3 mg/dL (ref 0.0–1.2)
CO2: 20 mmol/L (ref 20–29)
Calcium: 10 mg/dL (ref 8.7–10.3)
Chloride: 105 mmol/L (ref 96–106)
Creatinine, Ser: 0.68 mg/dL (ref 0.57–1.00)
Globulin, Total: 2.6 g/dL (ref 1.5–4.5)
Glucose: 107 mg/dL — ABNORMAL HIGH (ref 70–99)
Potassium: 4.3 mmol/L (ref 3.5–5.2)
Sodium: 140 mmol/L (ref 134–144)
Total Protein: 7 g/dL (ref 6.0–8.5)
eGFR: 92 mL/min/{1.73_m2} (ref >59)

## 2021-08-31 LAB — LIPID PANEL
Chol/HDL Ratio: 3 ratio (ref 0.0–4.4)
Cholesterol, Total: 152 mg/dL (ref 100–199)
HDL: 51 mg/dL (ref >39)
LDL Chol Calc (NIH): 82 mg/dL (ref 0–99)
Triglycerides: 102 mg/dL (ref 0–149)
VLDL Cholesterol Cal: 19 mg/dL (ref 5–40)

## 2021-08-31 NOTE — Progress Notes (Signed)
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 Audie Box in [place] who has retired.  She has history of recurrent bronchitis, developed MRSA, E. coli pneumonia diagnosed with bronchoscopy in June 2017.  This was treated as an outpatient Has occasional seasonal allergies and is on albuterol which she uses occasionally. Has occasional snoring, especially when she is fatigued.  She was tested with a sleep apnea around 2015 and was told that she had mild obstructive events only when she was lying supine.  Currently not on treatment with CPAP  Hospitalized for COVID-19 in August of 2022.  Required lengthy treatment with IV steroids, remdesivir and baricitinib.  She required high flow nasal cannula which was weaned down and was discharged on oral prednisone taper and 2 L oxygen at night  She is enrolled in pulmonary rehab with some improvement in symptoms.  She still has some hair loss and is wearing a break Had a follow-up CT showing post-COVID changes.  Echocardiogram was also done for evaluation of enlarged PA on CT scan.  There is no evidence of pulmonary hypertension.  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  Underwent cardiac catheterization with stenting of RCA.  She has followed up with cardiology at [facility].  Pets: Dog Occupation: Operates a Medical sales representative shop Exposures: No mold, hot tub, Jacuzzi.  No feather pillows or comforter Smoking history: Never smoker Travel history: No significant travel history Relevant family history: No significant family history of lung disease  Interim  history: States that she is doing well, breathing stable  She recently had a fall after she slipped on wet floor and had a right thigh hematoma.  Outpatient Encounter Medications as of 08/31/2021  Medication Sig   acetaminophen (TYLENOL) 325 MG tablet Take 650 mg by mouth every 6 (six) hours as needed.   albuterol (PROVENTIL) (2.5 MG/3ML) 0.083% nebulizer solution Take 3 mLs (2.5 mg total) by nebulization every 6 (six) hours as needed for wheezing or shortness of breath.   albuterol (VENTOLIN HFA) 108 (90 Base) MCG/ACT inhaler Inhale 2 puffs into the lungs every 6 (six) hours as needed for wheezing or shortness of breath.   ALPRAZolam (XANAX) 0.25 MG tablet Take 0.25 mg by mouth at bedtime.   amoxicillin-clavulanate (AUGMENTIN) 875-125 MG tablet Take 1 tablet by mouth 2 (two) times daily.   Ascorbic Acid (VITAMIN C) 1000 MG tablet Take 2,000 mg by mouth 2 (two) times daily.   aspirin 81 MG chewable tablet Chew 1 tablet (81 mg total) by mouth daily.   B Complex Vitamins (B COMPLEX 50 PO) Take 1 tablet by mouth daily.    budesonide (RHINOCORT AQUA) 32 MCG/ACT nasal spray Place 1 spray into both nostrils daily as needed for rhinitis.   Calcium-Magnesium-Zinc (CAL-MAG-ZINC PO) Take 2 tablets by mouth at bedtime.   Cholecalciferol (VITAMIN D3) 5000 units CAPS Take 5,000 Units by mouth at bedtime.   Coenzyme Q10 (CO Q 10) 100 MG CAPS Take 100 mg by mouth every evening.    Digestive Enzymes (ENZYME DIGEST) CAPS Take 1 capsule by mouth 2 (two) times daily with a meal.    fluconazole (DIFLUCAN)  150 MG tablet Take one tablet as needed for yeast infection: May repeat in 72 hours if needed   hydrocortisone (CORTEF) 5 MG tablet Take 5 mg by mouth See admin instructions. TAKE 10MG  IN THE MORNING AND 5 MG IN THE AFTERNOON.   Magnesium 400 MG TABS Take 400 mg by mouth at bedtime.   Multiple Vitamin (MULTIVITAMIN WITH MINERALS) TABS tablet Take 3 tablets by mouth daily. Pure-[place]   nitroGLYCERIN (NITROSTAT) 0.4  MG SL tablet Place 1 tablet (0.4 mg total) under the tongue every 5 (five) minutes as needed.   nystatin cream (MYCOSTATIN) Apply 1 application. topically 2 (two) times daily as needed for rash.   Omega-3 Fatty Acids (OMEGA 3 PO) Take 2 capsules by mouth daily.   OXYGEN Inhale into the lungs. 2L only at night   Polyethyl Glycol-Propyl Glycol 0.4-0.3 % SOLN Place 1 drop into both eyes 4 (four) times daily.    polyethylene glycol (MIRALAX / GLYCOLAX) 17 g packet Take 17 g by mouth daily as needed for mild constipation.   Probiotic Product (PROBIOTIC PO) Take 1 capsule by mouth every evening.    rosuvastatin (CRESTOR) 20 MG tablet Take 1 tablet (20 mg total) by mouth daily.   thyroid (ARMOUR) 30 MG tablet Take 30 mg by mouth daily before breakfast.   ticagrelor (BRILINTA) 90 MG TABS tablet Take 1 tablet (90 mg total) by mouth 2 (two) times daily.   vitamin A 10000 UNIT capsule Take 10,000 Units by mouth every evening.    [DISCONTINUED] diclofenac Sodium (VOLTAREN) 1 % GEL Apply 1 application topically 4 (four) times daily. (Patient not taking: Reported on 07/24/2021)   [DISCONTINUED] guaiFENesin-dextromethorphan (ROBITUSSIN DM) 100-10 MG/5ML syrup Take 10 mLs by mouth every 4 (four) hours as needed for cough.   [DISCONTINUED] nystatin (MYCOSTATIN) 500000 units TABS tablet Take 500,000 Units by mouth daily as needed. yeast   [DISCONTINUED] traMADol (ULTRAM) 50 MG tablet Take 1 tablet (50 mg total) by mouth every 6 (six) hours as needed. (Patient not taking: Reported on 07/24/2021)   [DISCONTINUED] triamcinolone cream (KENALOG) 0.1 % Apply 1 application topically 3 (three) times daily as needed. Skin irritation (Patient not taking: Reported on 07/24/2021)   No facility-administered encounter medications on file as of 08/31/2021.    Physical Exam: Blood pressure 116/68, pulse 81, height 5\' 3"  (1.6 m), weight 182 lb (82.6 kg), SpO2 97 %. Gen:      No acute distress HEENT:  EOMI, sclera anicteric Neck:      No masses; no thyromegaly Lungs:    Clear to auscultation bilaterally; normal respiratory effort CV:         Regular rate and rhythm; no murmurs Abd:      + bowel sounds; soft, non-tender; no palpable masses, no distension Ext:    No edema; adequate peripheral perfusion Skin:      Warm and dry; no rash Neuro: alert and oriented x 3 Psych: normal mood and affect   Data Reviewed: Imaging: Chest x-ray 09/03/2015- mild atelectasis at the left base.   High-resolution CT 06/21/2020-no evidence of interstitial lung disease, mild centrilobular nodularity, 20 pulmonary reduce measuring 4 mm High-res CT 02/01/2021-basilar predominant peripheral groundglass, linear densities consistent with COVID-19.  Enlarged pulmonary artery Chest x-ray 04/01/2021-improvement in bilateral airspace disease Chest x-ray 07/22/2021-continued improvement of bilateral opacities. I reviewed the images personally.  PFTs: 09/26/2015 FVC 3.40 [116%], FEV1 2.64 [125%], F/F 78 Normal spirometry  08/31/2021 FVC 2.76 [98%], FEV1 1.86 [88%], F/F 67, TLC 5.05 [103%],  DLCO 17.32 [92%] Mild obstructive airway disease with bronchodilator response  Cardiac: Echocardiogram 02/16/2021 LVEF 60-65%, mild LVH, grade 1 diastolic dysfunction.  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  This is new from March 2022 and is consistent with post-COVID changes Continue pulmonary rehab X-ray shows continued improvement in infiltrates.  Order high-res CT in 6 months  Continue nocturnal supplemental oxygen for now  Daytime fatigue Suspect sleep apnea.  Check home sleep study.  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  Echo reviewed with no evidence of pulmonary hypertension  Bronchitis She has a history of asthma, COPD on record but no objective evidence of obstruction on PFTs Occasionally needs to use  albuterol inhaler, not on controller medication  PFTs reviewed with mild obstruction bronchodilator response assist of asthma.  We discussed starting controller medication but since she is doing well she wants to hold off for now.  Continue albuterol as needed  Plan/Recommendations: Home sleep study High-res CT in 6 months  Marshell Garfinkel MD [facility] 08/31/2021, 10:18 AM  CC: Beola Cord, FNP

## 2021-08-31 NOTE — Patient Instructions (Signed)
Full PFT performed today. °

## 2021-08-31 NOTE — Patient Instructions (Signed)
I am glad you are doing better with your breathing any x-rays improving Will order home sleep study for evaluation of sleep apnea and oxygen levels at night Order high-res CT in 6 months and follow-up in clinic after 6 months.

## 2021-08-31 NOTE — Progress Notes (Signed)
Full PFT performed today. °

## 2021-09-12 ENCOUNTER — Telehealth: Payer: Self-pay | Admitting: Pulmonary Disease

## 2021-09-12 NOTE — Telephone Encounter (Signed)
Given her history and symptoms, will treat for bronchitis. Please send prednisone burst 40 mg for 5 days; take in AM with food. Doxycycline 100 mg Twice daily for 5 days. Take with food, wear sunscreen when outside and avoid direct sun exposure while taking to reduce risk of sunburns. Use albuterol inhaler PRN shortness of breath, wheezing or coughing spells. Delsym 2 tsp OTC for cough. Mucinex 600 mg Twice daily for chest congestion. If she does not improve or worsens, needs OV with CXR or seek emergency care. Thanks.

## 2021-09-12 NOTE — Telephone Encounter (Signed)
I would have her come in for CXR, do not want to over use Augmentin if not needed. Make sure she is taking mucinex as directed.

## 2021-09-12 NOTE — Telephone Encounter (Signed)
Primary Pulmonologist: Dr. Isaiah Serge  Last office visit and with whom: Dr. Isaiah Serge 08/31/2021 What do we see them for (pulmonary problems): Pneumonia due to Covid, Bronchitis  Last OV assessment/plan: see below  Was appointment offered to patient (explain)?  Yes patient declined    Reason for call:  Starting Monday patient has been coughing up thick yellow mucus  Patient states she is afraid she is starting to have some chest congestion Has taken robitussin DM  Has not taken flu or covid tests  No fever or wheezing  Is requesting Augmentin   Dr. Isaiah Serge is out of office so routing to DOD  Katie Cobb please advise. Thanks!   Allergies  Allergen Reactions   Avelox [Moxifloxacin Hcl In Nacl] Anaphylaxis   Biaxin [Clarithromycin] Anaphylaxis   Lactose Intolerance (Gi) Other (See Comments)    Bloating, gas, congestion   Tobramycin Other (See Comments)    Severe headaches    Yeast-Related Products Swelling    Tongue swelling /candida     There is no immunization history on file for this patient.  Assessment:  Post COVID-19 Seems to be making good recovery from her recent COVID-19 infection CT reviewed with basal interstitial lung disease.  This is new from March 2022 and is consistent with post-COVID changes Continue pulmonary rehab X-ray shows continued improvement in infiltrates.  Order high-res CT in 6 months   Continue nocturnal supplemental oxygen for now   Daytime fatigue Suspect sleep apnea.  Check home sleep study.   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  Echo reviewed with no evidence of pulmonary hypertension   Bronchitis She has a history of asthma, COPD on record but no objective evidence of obstruction on PFTs Occasionally needs to use albuterol inhaler, not on controller medication   PFTs reviewed with mild obstruction bronchodilator response assist of asthma.  We discussed starting controller medication but since she is doing well she wants to hold off for  now.  Continue albuterol as needed   Plan/Recommendations: Home sleep study High-res CT in 6 months   Chilton Greathouse MD [facility] 08/31/2021, 10:18 AM

## 2021-09-12 NOTE — Telephone Encounter (Signed)
Called and spoke with pt letting her know recs per BW. Pt said she called PCP and made an appt to be seen tomorrow 6/1. Nothing further needed.

## 2021-09-12 NOTE — Telephone Encounter (Signed)
Called patient and she states she cannot take prednisone or doxycycline due to reactions that she has from both medicines and states she only wants to take augmentin but is agreeable to mucinex and delsym

## 2021-09-12 NOTE — Telephone Encounter (Signed)
Would not recommend augmentin at this point given two days of symptoms and no other infectious symptoms. She will need an OV or virtual visit to discuss further.

## 2021-09-12 NOTE — Telephone Encounter (Signed)
ATC patient. Line rang for approx 2 minutes and the received "Call could not be completed at this time message"

## 2021-09-12 NOTE — Telephone Encounter (Signed)
pt states her cough has developed yeaterday afternoon and has gotten worse this morning. and also states she is sob, and has been using her alubuterol and is asking for antibiotic. also told pt we could get her in for an appt w/ an app on Friday but she says ''no''.  [facility] [place]

## 2021-09-12 NOTE — Telephone Encounter (Signed)
Called and spoke with pt letting her know the recs per Katie. Pt wanted to have this sent to [place] to see if she is okay with sending augmentin to the pharmacy for her. Beth, please advise.

## 2021-09-17 ENCOUNTER — Ambulatory Visit (INDEPENDENT_AMBULATORY_CARE_PROVIDER_SITE_OTHER): Payer: Medicare Other | Admitting: Pharmacist Clinician (PhC)/ Clinical Pharmacy Specialist

## 2021-09-17 VITALS — BP 126/78 | HR 71 | Resp 17 | Ht 63.0 in | Wt 188.2 lb

## 2021-09-17 DIAGNOSIS — I25119 Atherosclerotic heart disease of native coronary artery with unspecified angina pectoris: Secondary | ICD-10-CM | POA: Diagnosis not present

## 2021-09-17 DIAGNOSIS — E785 Hyperlipidemia, unspecified: Secondary | ICD-10-CM | POA: Diagnosis not present

## 2021-09-17 NOTE — Progress Notes (Unsigned)
09/17/2021 Monica Harrison February 27, 1951 [id number]   HPI:  Monica Harrison is a 71 y.o. female patient of Dr Herbie Baltimore, who presents today for a lipid clinic evaluation.  See pertinent past medical history below.    She is here today with her husband to discuss options for further cholesterol lowering.  She notes being hospitalized last August for 10 days due to Covid, then had a STEMI in December with 100% blockage of the DES.  She is currently doing PT with a therapist at the [facility] in her area twice weekly.   She was seen by Dr. Herbie Baltimore in April at which time he switched atorvastatin 20 mg to rosuvastatin 20 mg for better LDL lowering.  Unfortunately she had a fall 2 days later and developed hematoma on her right thigh.  She states she ws told not to change any of her medications, and therefore is still on the atorvastatin.    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, DES to proximal RCA (was 100% blocked) DAPT x 1 year  Diabetes A1c 12/22 6.7; most recent lower   OSA Mild obstructive events when supine, no CPAP  Adrenal insufficiency Followed in [place]  Recurrent bronchitis Followed by Dr Isaiah Serge at [facility] pulmonology   Current Medications: atorvastatin 20 mg   Cholesterol Goals:  LDL < 55   Intolerant/previously tried: atorvastatin in higher doses - myalgias  Family history: father died at 58 (C dif); mother died at 23 - ulcer in her stomach; sister healthy; son - 44 healthy/active  (mom and sister with DM in late 37's)  Diet: mix for past few months (too many MD appts); grilled vegetables, salads; not much pork, but mostly chicken, fish  Exercise:  PT twice weekly, walking laps in house on other days  Labs: 5/23: TC 152, TG 102, HDL 51, LDL 82 (on atorvastatin 20 mg)   Current Outpatient Medications  Medication Sig Dispense Refill   acetaminophen (TYLENOL) 325 MG tablet Take 650 mg by mouth every 6 (six) hours as needed.     albuterol (PROVENTIL) (2.5 MG/3ML) 0.083% nebulizer solution Take 3  mLs (2.5 mg total) by nebulization every 6 (six) hours as needed for wheezing or shortness of breath. 140 mL 11   albuterol (VENTOLIN HFA) 108 (90 Base) MCG/ACT inhaler Inhale 2 puffs into the lungs every 6 (six) hours as needed for wheezing or shortness of breath. 8 g 5   ALPRAZolam (XANAX) 0.25 MG tablet Take 0.25 mg by mouth at bedtime.     amoxicillin-clavulanate (AUGMENTIN) 875-125 MG tablet Take 1 tablet by mouth 2 (two) times daily. 14 tablet 0   Ascorbic Acid (VITAMIN C) 1000 MG tablet Take 2,000 mg by mouth 2 (two) times daily.     aspirin 81 MG chewable tablet Chew 1 tablet (81 mg total) by mouth daily. 90 tablet 2   atorvastatin (LIPITOR) 20 MG tablet Take 20 mg by mouth daily.     B Complex Vitamins (B COMPLEX 50 PO) Take 1 tablet by mouth daily.      budesonide (RHINOCORT AQUA) 32 MCG/ACT nasal spray Place 1 spray into both nostrils daily as needed for rhinitis.     Calcium-Magnesium-Zinc (CAL-MAG-ZINC PO) Take 2 tablets by mouth at bedtime.     Cholecalciferol (VITAMIN D3) 5000 units CAPS Take 5,000 Units by mouth at bedtime.     Coenzyme Q10 (CO Q 10) 100 MG CAPS Take 100 mg by mouth every evening.      diclofenac Sodium (VOLTAREN) 1 %  GEL Apply 1 application. topically 4 (four) times daily.     Digestive Enzymes (ENZYME DIGEST) CAPS Take 1 capsule by mouth 2 (two) times daily with a meal.      fluconazole (DIFLUCAN) 150 MG tablet Take one tablet as needed for yeast infection: May repeat in 72 hours if needed 2 tablet 0   hydrocortisone (CORTEF) 5 MG tablet Take 5 mg by mouth See admin instructions. TAKE 10MG  IN THE MORNING AND 5 MG IN THE AFTERNOON.     Magnesium 400 MG TABS Take 400 mg by mouth at bedtime.     Multiple Vitamin (MULTIVITAMIN WITH MINERALS) TABS tablet Take 3 tablets by mouth daily. Pure-[place]     nitroGLYCERIN (NITROSTAT) 0.4 MG SL tablet Place 1 tablet (0.4 mg total) under the tongue every 5 (five) minutes as needed. 25 tablet 2   nystatin cream (MYCOSTATIN) Apply 1  application. topically 2 (two) times daily as needed for rash.     Omega-3 Fatty Acids (OMEGA 3 PO) Take 2 capsules by mouth daily.     OXYGEN Inhale into the lungs. 2L only at night     Polyethyl Glycol-Propyl Glycol 0.4-0.3 % SOLN Place 1 drop into both eyes 4 (four) times daily.      polyethylene glycol (MIRALAX / GLYCOLAX) 17 g packet Take 17 g by mouth daily as needed for mild constipation.     Probiotic Product (PROBIOTIC PO) Take 1 capsule by mouth every evening.      thyroid (ARMOUR) 30 MG tablet Take 30 mg by mouth daily before breakfast.     ticagrelor (BRILINTA) 90 MG TABS tablet Take 1 tablet (90 mg total) by mouth 2 (two) times daily. 60 tablet 11   vitamin A UNIT capsule Take 10,000 Units by mouth every evening.      No current facility-administered medications for this visit.    Allergies  Allergen Reactions   Avelox [Moxifloxacin Hcl In Nacl] Anaphylaxis   Biaxin [Clarithromycin] Anaphylaxis   Lipitor [Atorvastatin]     myalgias   Lactose Intolerance (Gi) Other (See Comments)    Bloating, gas, congestion   Tobramycin Other (See Comments)    Severe headaches    Yeast-Related Products Swelling    Tongue swelling /candida    Past Medical History:  Diagnosis Date   Adrenal insufficiency (HCC)    Bronchitis, mucopurulent recurrent (HCC)    CAD (coronary artery disease), native coronary artery 04/01/2021   a. s/p Inferior STEMI in 03/2021: 100% p-mRCA (DES PCI); Residual 10% prox LCx stenosis and 20% mid LCx stenosis   Hyperlipidemia    MRSA pneumonia (HCC) 09/2015   Associated with a E. coli pneumonia-diagnosed by bronchoscopy.   OSA (obstructive sleep apnea) 2015   Sleep study showed mild obstructive events only when lying supine => not on CPAP.   Pneumonia due to COVID-19 virus 11/2018   Hospitalized August 2022 for COVID-19-required lengthy treatment with IV steroids along with remdesivir and baricitinib.  She eventually discharged home on oral prednisone  taper and nightly 2 L [place] oxygen. => Pulmonary Rehab; f/u CT Chest: basal interstitial lung disease-consistent with post-COVID changes   Prediabetes    STEMI (ST elevation myocardial infarction) (HCC)    100% prox-mid RCA - DES PCI (DES 2.75 mm x 30 mm DES stent postdilated to 3.1 mm)   Thyroid disease     Blood pressure 126/78, pulse 71, resp. rate 17, height 5\' 3"  (1.6 m), weight 188 lb 3.2 oz (85.4 kg), SpO2 97 %.  Hyperlipidemia with target LDL less than 70 Patient with ASCVD and LDL cholesterol not at goal.  She states labs drawn week before from PCP showed LDL at 60.  She has not yet switched from atorvastatin to rosuvastatin 20 mg.  Will have her do that at this time and repeat cholesterol labs in 3 months.  At that time if LDL is still elevated, we can try adding ezetimibe, as she doesn't need much lowering to get to goal.     Phillips HayKristin Tige Meas PharmD CPP [facility] [address] [place], [place] [id number] [phone]

## 2021-09-17 NOTE — Patient Instructions (Signed)
Your Results:             Your most recent labs Goal  Total Cholesterol 152 < 200  Triglycerides 102 < 150  HDL (happy/good cholesterol) 51 > 40  LDL (lousy/bad cholesterol 82 < 55   Medication changes:  Switch from atorvastatin to rosuvastatin (Crestor) 20 mg once daily.  After 2-3 months we will need you to repeat cholesterol labs.      Thank you for choosing [facility]

## 2021-09-17 NOTE — Assessment & Plan Note (Signed)
Patient with ASCVD and LDL cholesterol not at goal.  She states labs drawn week before from PCP showed LDL at 60.  She has not yet switched from atorvastatin to rosuvastatin 20 mg.  Will have her do that at this time and repeat cholesterol labs in 3 months.  At that time if LDL is still elevated, we can try adding ezetimibe, as she doesn't need much lowering to get to goal.

## 2021-09-20 ENCOUNTER — Telehealth: Payer: Self-pay | Admitting: Cardiology

## 2021-09-20 ENCOUNTER — Other Ambulatory Visit: Payer: Self-pay

## 2021-09-20 NOTE — Telephone Encounter (Signed)
Received fax. Printed it off for Dr. Ellyn Hack and Ivin Booty. Neither are in office today.

## 2021-09-20 NOTE — Telephone Encounter (Signed)
Orpha Bur from [facility] states she has faxed a physical therapy initial therapy plan of care for Dr. Herbie Baltimore to sign and has not received it back. She states she has faxed it 3 times now. Phone: [phone], Fax: [phone]

## 2021-09-20 NOTE — Telephone Encounter (Signed)
Spoke with Monica Harrison, she states she has faxed the form 3 times. I asked her to refax it and I will be on he look out for it. Verified she has the correct fax number.

## 2021-10-05 NOTE — Telephone Encounter (Signed)
[facility] called back today for an update on the physical therapy plan of care forms. She is requesting a sign off from Dr. Herbie Baltimore and for the forms to be faxed back to [phone].

## 2021-10-18 NOTE — Telephone Encounter (Signed)
Orpha Bur from [facility] called back today stating that they still have not received the fax for physical therapy plan of care forms. Verified the fax number is [phone]

## 2021-10-18 NOTE — Telephone Encounter (Signed)
Spoke to [place],    She states patient has a monthly plan of care that needs to be signed.she states that May and June plan of care needs to be signed and faxed . She will refax papers now. RN states she will have Dr  to sign when he returns to office. [place] voiced understanding.

## 2021-10-19 NOTE — Telephone Encounter (Signed)
Faxed  physical therapy plan of care  from May 2023  back to [facility]

## 2021-11-07 ENCOUNTER — Telehealth: Payer: Self-pay | Admitting: Pulmonary Disease

## 2021-11-07 NOTE — Telephone Encounter (Signed)
Okay to use nebulizer 4 times a day.  Please have the home test for COVID.  If positive then we can call in Paxlovid

## 2021-11-07 NOTE — Telephone Encounter (Signed)
Patient states took home Covid 19 test and was negative. Patient phone number is [phone].

## 2021-11-07 NOTE — Telephone Encounter (Signed)
I called the patient and she said her spouse took a test  for  Covid  last night and tested positive 11/06/2021. He is having some chills and body aches. He is being treated with Paxlovid at this time by his PCP. They are not sleeping together but she is starting to develop symptoms.  She wants to know if she can use her nebulizer 4 times daily and she is having some chills as well since 11/06/21. She has not takne anything for this. She wants to know if she can get Paxlovid.  Pharmacy is [facility] in [place]. Please advise.

## 2021-11-07 NOTE — Telephone Encounter (Signed)
Called and spoke to pt. Pt states her covid test is negative. Advised pt she could test again in a few days if she develops more symptoms or worsening symptoms and to call back if positive. Pt verbalized understanding and denied any further questions or concerns at this time.

## 2021-11-07 NOTE — Telephone Encounter (Signed)
I called the patient and she voices understanding. She is going to get a Covid test and call the office back.

## 2021-11-12 ENCOUNTER — Telehealth: Payer: Self-pay | Admitting: Pulmonary Disease

## 2021-11-12 MED ORDER — AMOXICILLIN-POT CLAVULANATE 875-125 MG PO TABS: 1.0000 | ORAL_TABLET | Freq: Two times a day (BID) | ORAL | 0 refills | Status: DC

## 2021-11-12 NOTE — Telephone Encounter (Signed)
Patient is calling because she'e been having some congestion. Her husband tested postive for covid last week. She also took a test last Wednesday and it was negative. She hasn't taken another one. She also has some tightness in her chest, no fever, no headaches. Says she's exhausted and wants to know what she should do .

## 2021-11-12 NOTE — Telephone Encounter (Signed)
All Conversations on this Encounter  Monica Milch, MD  [facility] Pool 1 hour ago (1:32 PM)    Augmentin 875 twice daily for 7 days.  Mucinex 600 twice daily for 7 days   I called the patient to let her know as well and she voices understanding. I hve sent in the Augmentin per Dr. Vassie Loll recommendation. Nothing further needed.

## 2021-11-12 NOTE — Telephone Encounter (Signed)
She reports that she is having some nasal congestion and cough that is green sputum. She feels tired and feels that her chest is tight. She reports that had pneumonia a few months ago and had to have Augmentin in the past to get rid of her chest congestion.  She is taking her nebulizer 4 times daily, she is on Brilinta and wants to know how much Mucinex  a day. She is using her nettie pot for her congestion and it helps some. Please advise on how much Mucinex she can take a day.    She is going to get a new Covid test today and will let us know once she has the results. If this is negative she wants to know if she can have Augmentin and if so how much. Please advise?

## 2021-11-12 NOTE — Telephone Encounter (Signed)
She has been on 10 days of Augmentin in the past. She was negative for Covid as of today.  Please advise.

## 2021-11-15 NOTE — Telephone Encounter (Signed)
[facility] is calling back from Pt says she needs the update signed and sent back from recent physical therapy plan of care for both June and July.

## 2021-11-16 NOTE — Telephone Encounter (Signed)
Called office = office closed for the day .    Both  plan of care  for June and July were faxed earlier today to [facility] fax

## 2021-12-07 ENCOUNTER — Ambulatory Visit: Payer: Medicare Other

## 2021-12-07 DIAGNOSIS — G479 Sleep disorder, unspecified: Secondary | ICD-10-CM

## 2021-12-10 ENCOUNTER — Encounter: Payer: Self-pay | Admitting: Cardiology

## 2021-12-10 ENCOUNTER — Ambulatory Visit: Payer: Medicare Other | Attending: Cardiology | Admitting: Cardiology

## 2021-12-10 VITALS — BP 138/70 | HR 62 | Ht 63.0 in | Wt 186.6 lb

## 2021-12-10 DIAGNOSIS — E785 Hyperlipidemia, unspecified: Secondary | ICD-10-CM

## 2021-12-10 DIAGNOSIS — E2740 Unspecified adrenocortical insufficiency: Secondary | ICD-10-CM | POA: Diagnosis not present

## 2021-12-10 DIAGNOSIS — I25119 Atherosclerotic heart disease of native coronary artery with unspecified angina pectoris: Secondary | ICD-10-CM

## 2021-12-10 DIAGNOSIS — I2510 Atherosclerotic heart disease of native coronary artery without angina pectoris: Secondary | ICD-10-CM | POA: Diagnosis not present

## 2021-12-10 DIAGNOSIS — E118 Type 2 diabetes mellitus with unspecified complications: Secondary | ICD-10-CM | POA: Insufficient documentation

## 2021-12-10 DIAGNOSIS — R001 Bradycardia, unspecified: Secondary | ICD-10-CM

## 2021-12-10 DIAGNOSIS — Z955 Presence of coronary angioplasty implant and graft: Secondary | ICD-10-CM

## 2021-12-10 LAB — HEPATIC FUNCTION PANEL
ALT: 43 IU/L — ABNORMAL HIGH (ref 0–32)
AST: 42 IU/L — ABNORMAL HIGH (ref 0–40)
Albumin: 4.9 g/dL — ABNORMAL HIGH (ref 3.8–4.8)
Alkaline Phosphatase: 72 IU/L (ref 44–121)
Bilirubin Total: 0.3 mg/dL (ref 0.0–1.2)
Bilirubin, Direct: <0.1 mg/dL (ref 0.00–0.40)
Total Protein: 8 g/dL (ref 6.0–8.5)

## 2021-12-10 LAB — LIPID PANEL
Chol/HDL Ratio: 3.7 ratio (ref 0.0–4.4)
Cholesterol, Total: 167 mg/dL (ref 100–199)
HDL: 45 mg/dL (ref >39)
LDL Chol Calc (NIH): 98 mg/dL (ref 0–99)
Triglycerides: 133 mg/dL (ref 0–149)
VLDL Cholesterol Cal: 24 mg/dL (ref 5–40)

## 2021-12-10 NOTE — Progress Notes (Signed)
Primary Care Provider: Zane Herald, MD Cardiologist: Glenetta Hew, MD Electrophysiologist: None Pulmonologist: Marshell Garfinkel, MD  Clinic Note: Chief Complaint  Patient presents with   Coronary Artery Disease    No angina   Follow-up    Doing well.  Needs more time with PT   ===================================  ASSESSMENT/PLAN   Problem List Items Addressed This Visit       Cardiology Problems   Coronary artery disease involving native heart without angina pectoris - Primary (Chronic)    No further anginal symptoms since her MI.  Has just had a long time to recover, partly complicated by the fact that she was still recovering from her COVID issues.  Baseline disability from her adrenal insufficiency...  Plan:  Continue DAPT (ASA/Brilinta) uninterrupted until January 2024 For now on atorvastatin, based on lipid panel we will adjust accordingly. Not on beta-blocker because of fatigue issues and bradycardia. Also not on ARB-BP had not required additional therapy. She has ups and downs of her BPs.  Today's blood pressure 138/70 is high for her.  At home she usually is in the 110-120/ 70 mm range.  Due to labile pressures, will hold off on adding ARB.      Relevant Orders   EKG 12-Lead (Completed)   Hyperlipidemia with target LDL less than 70 (Chronic)    Unfortunately, she did not switch over to rosuvastatin either when I ordered it or when it was ordered by [facility].  We now lost the better part of the year without adequate coverage and we do not have recent labs to review.  I have ordered labs for today revealing an LDL of 98.  We will reorder rosuvastatin 20 mg daily, then add combination of Nexletol and ezetimibe after 2 weeks.  Reassess lipids 3 months following the addition.  If still not at goal, would then need to consider PCSK9 inhibitor or inclisiran.      Relevant Orders   EKG 12-Lead (Completed)     Other   Presence of drug coated stent in right coronary  artery (Chronic)    Continue DAPT (ASA/Brilinta) uninterrupted until January 2024  Following 1 year of uninterrupted DAPT (April 15, 2022), DC aspirin & reduced Thienopyridine to either maintenance dose of Brilinta 60 mg twice daily or convert to clopidogrel 75 mg daily As of April 15, 2022, okay to hold antiplatelet agents for procedures or surgeries 5 to 7 days preop.      Relevant Orders   EKG 12-Lead (Completed)   Adrenal insufficiency (HCC) (Chronic)    Prolonged recovery requiring continued rehab.  We we will renew referral to [facility] physical therapy for additional 3 months.      Relevant Orders   EKG 12-Lead (Completed)   Bradycardia (Chronic)    Significant nighttime bradycardia-probably sleep disordered breathing.  Resting heart rate 62.  Avoid beta-blocker      Relevant Orders   EKG 12-Lead (Completed)   ===================================  HPI:    Monica Harrison is a 71 y.o. female with a PMH notable for CAD-inferior STEMI-RCA PCI December 2022, HTN, HLD, Chronic Adrenal Insufficiency, chronic fatigue-sleep disordered breathing who presents today for 22-month follow-up.  04/01/2021: Inferior STEMI: 100% proximal-mid RCA-DES PCI.  EF 65 to 70%. Zio patch placed because of bradycardia, no beta-blocker.  Monica Harrison was last seen on July 18, 2021  => Referral made to [facility] for physical therapy with "Abigail Butts"  Recent Hospitalizations: none  Seen by Dr. Vaughan Browner  from Pulmonary Medicine May 19-> was doing relatively well.  Breathing stable.  Commented on recent fall in April- slipped on a wet floor, had a right thigh hematoma => Home sleep study ordered along with high-res chest CT scan follow-up in November => Per patient, she was told to simply use nighttime oxygen.  (Mild nocturnal hypoxia-not on CPAP)  Most recently seen by Phillips Hay, [facility] on September 17, 2021 for lipid management. Had not yet switched from atorvastatin to  rosuvastatin => plan was lab recheck in 3 months and then consider Zetia. => Unfortunately, the rosuvastatin prescription never went through.  Reviewed  CV studies:    The following studies were reviewed today: (if available, images/films reviewed: From Epic Chart or Care Everywhere) No new studies:  Interval History:   Monica Harrison returns here today for follow-up.  She seems to be doing okay.  She says she had the fall 2 days after I saw her (07/20/2021).  She slipped and fell and had a large bruise.  Since then she has just been having a hard time recovering and getting back to full activity level.  She has been doing physical therapy which has really been helping her.  She is gradually getting stronger and getting less short of breath.  She still does get short of breath with the therapy but it is notably better than it had been.  Her therapist thinks she probably will need at least 3 months more of therapy which would put her 2-3 months beyond the initial referral window prescription that I provided.  (We will happily recommend referral for additional 3 months.)  She has not had any symptoms whatsoever with chest pain or pressure or dyspnea with rest or exertion.  She just has generalized fatigue which seems to predate her MI.  She noted that one of the prodrome symptoms leading up to her MI was profound fatigue.  This is definitely getting better with the rehab.  She also has been having some issues with fatigue as well as dizziness related to her adrenal insufficiency.  She does feel more rested sleeping with oxygen that she had without.  She denies any CHF symptoms of PND, orthopnea or edema.  Remains of CV Review of Symptoms (Summary): positive for - dyspnea on exertion and this is more related to her fatigue and ongoing pulmonary issues than cardiac. negative for - chest pain, edema, irregular heartbeat, orthopnea, palpitations, paroxysmal nocturnal dyspnea, rapid heart rate, or  syncope/near syncope, TIA/amaurosis fugax or claudication  REVIEWED OF SYSTEMS   Pertinent CV symptoms per HPI Continued fatigue and malaise Persistent dyspnea with improving cough Generalized weakness and fatigue/muscle atrophy-doing better with PT; stable/improving exercise intolerance, and Less prominent myalgias.  Presumably stable on atorvastatin Still has nervousness and anxiety.  I have reviewed and (if needed) personally updated the patient's problem list, medications, allergies, past medical and surgical history, social and family history.   PAST MEDICAL HISTORY   Past Medical History:  Diagnosis Date   Adrenal insufficiency (HCC)    Bronchitis, mucopurulent recurrent (HCC)    CAD (coronary artery disease), native coronary artery 04/01/2021   a. s/p Inferior STEMI in 03/2021: 100% p-mRCA (DES PCI); Residual 10% prox LCx stenosis and 20% mid LCx stenosis   Hyperlipidemia    MRSA pneumonia (HCC) 09/2015   Associated with a E. coli pneumonia-diagnosed by bronchoscopy.   OSA (obstructive sleep apnea) 2015   Sleep study showed mild obstructive events only when lying supine => not  on CPAP.   Pneumonia due to COVID-19 virus 11/2018   Hospitalized August 2022 for COVID-19-required lengthy treatment with IV steroids along with remdesivir and baricitinib.  She eventually discharged home on oral prednisone taper and nightly 2 L [place] oxygen. 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  One 3.1-second pause at 8 AM.  Sinus bradycardia with junctional escape beats down to 28 bpm  noted between 630 and 7:30 AM.  Junctional rhythm bradycardia noted at 2 AM.  Occasional PACs (2.2%) with 2 short atrial runs fastest and longest was 7 beats at 128 bpm. = Bradycardia & Pauses reviewed & cleared by EP - avoid AVN agents   CORONARY/GRAFT ACUTE MI REVASCULARIZATION N/A 04/01/2021   Procedure: Coronary/Graft Acute MI Revascularization;  Surgeon: Leonie Man, MD;  Location: [facility];  Service: Cardiovascular;  Successful DES PCI of the proximal RCA using 2.75 mm x 30 mm DES stent postdilated to 3.1 mm.   LEFT HEART CATH AND CORONARY ANGIOGRAPHY N/A 04/01/2021   Procedure: LEFT HEART CATH AND CORONARY ANGIOGRAPHY;  Surgeon: Leonie Man, MD;  Location: [facility];  Service: Cardiovascular; Severe single-vessel CAD with 100% thrombotic occlusion of the prox RCA (-> DES PCI); Preserved LVEF; Borderline hypotension requiring Levophed infusion.   TRANSTHORACIC ECHOCARDIOGRAM  04/02/2021   (Immediately post Inferior STEMI) hyperdynamic LV, EF 65 to 70%.  No RWMA.  Moderate basal septal hypertrophy.  Indeterminate diastolic pressures (grade 1 diastolic function).  Mildly reduced RV function but unable assess PAP.  RAP estimated at 15 mmHg with mild right atrial dilation.  Normal mitral and aortic valves.   Cath-PCI: 04/02/2021:  Severe single-vessel CAD with 100% thrombotic occlusion of the proximal RCA; Successful DES PCI of the proximal RCA using 2.75 mm x 30 mm DES stent postdilated to 3.1 mm. Preserved LVEF; Borderline hypotension requiring Levophed infusion.    There is no immunization history on file for this patient.  MEDICATIONS/ALLERGIES   Current Meds  Medication Sig   acetaminophen (TYLENOL) 325 MG tablet Take 650 mg by mouth every 6 (six) hours as needed.   albuterol (PROVENTIL) (2.5 MG/3ML) 0.083% nebulizer solution Take 3 mLs (2.5 mg total) by nebulization every 6 (six) hours as needed for wheezing or shortness of breath.   albuterol (VENTOLIN  HFA) 108 (90 Base) MCG/ACT inhaler Inhale 2 puffs into the lungs every 6 (six) hours as needed for wheezing or shortness of breath.   ALPRAZolam (XANAX) 0.25 MG tablet Take 0.25 mg by mouth at bedtime.   amoxicillin-clavulanate (AUGMENTIN) 875-125 MG tablet Take 1 tablet by mouth 2 (two) times daily.   Ascorbic Acid (VITAMIN C) 1000 MG tablet Take 2,000 mg by mouth 2 (two) times daily.   aspirin 81 MG chewable tablet Chew 1 tablet (81 mg total) by mouth daily.   atorvastatin (LIPITOR) 20 MG tablet Take 20 mg by mouth daily.   B Complex Vitamins (B COMPLEX 50 PO) Take 1 tablet by mouth daily.    budesonide (RHINOCORT AQUA) 32 MCG/ACT nasal spray Place  1 spray into both nostrils daily as needed for rhinitis.   Calcium-Magnesium-Zinc (CAL-MAG-ZINC PO) Take 2 tablets by mouth at bedtime.   Cholecalciferol (VITAMIN D3) 5000 units CAPS Take 5,000 Units by mouth at bedtime.   Coenzyme Q10 (CO Q 10) 100 MG CAPS Take 100 mg by mouth every evening.    diclofenac Sodium (VOLTAREN) 1 % GEL Apply 1 application. topically 4 (four) times daily.   Digestive Enzymes (ENZYME DIGEST) CAPS Take 1 capsule by mouth 2 (two) times daily with a meal.    fluconazole (DIFLUCAN) 150 MG tablet Take one tablet as needed for yeast infection: May repeat in 72 hours if needed   hydrocortisone (CORTEF) 5 MG tablet Take 5 mg by mouth See admin instructions. TAKE 10MG  IN THE MORNING AND 5 MG IN THE AFTERNOON.   Magnesium 400 MG TABS Take 400 mg by mouth at bedtime.   Multiple Vitamin (MULTIVITAMIN WITH MINERALS) TABS tablet Take 3 tablets by mouth daily. Pure-[place]   nitroGLYCERIN (NITROSTAT) 0.4 MG SL tablet Place 1 tablet (0.4 mg total) under the tongue every 5 (five) minutes as needed.   nystatin cream (MYCOSTATIN) Apply 1 application. topically 2 (two) times daily as needed for rash.   Omega-3 Fatty Acids (OMEGA 3 PO) Take 2 capsules by mouth daily.   OXYGEN Inhale into the lungs. 2L only at night   Polyethyl Glycol-Propyl Glycol  0.4-0.3 % SOLN Place 1 drop into both eyes 4 (four) times daily.    polyethylene glycol (MIRALAX / GLYCOLAX) 17 g packet Take 17 g by mouth daily as needed for mild constipation.   Probiotic Product (PROBIOTIC PO) Take 1 capsule by mouth every evening.    thyroid (ARMOUR) 30 MG tablet Take 30 mg by mouth daily before breakfast.   ticagrelor (BRILINTA) 90 MG TABS tablet Take 1 tablet (90 mg total) by mouth 2 (two) times daily.   vitamin A 10000 UNIT capsule Take 10,000 Units by mouth every evening.     Allergies  Allergen Reactions   Avelox [Moxifloxacin Hcl In Nacl] Anaphylaxis   Biaxin [Clarithromycin] Anaphylaxis   Lipitor [Atorvastatin]     myalgias   Lactose Intolerance (Gi) Other (See Comments)    Bloating, gas, congestion   Tobramycin Other (See Comments)    Severe headaches    Yeast-Related Products Swelling    Tongue swelling /candida    SOCIAL HISTORY/FAMILY HISTORY   Reviewed in Epic:  Pertinent findings:  Social History   Tobacco Use   Smoking status: Never   Smokeless tobacco: Never  Vaping Use   Vaping Use: Never used  Substance Use Topics   Alcohol use: No   Drug use: No   Social History   Social History Narrative   Pets: Dog   Occupation: Operates a Medical sales representative shop   Exposures: No mold, hot tub, Jacuzzi.  No feather pillows or comforter   Smoking history: Never smoker   Travel history: No significant travel history   Relevant family history: No significant family history of lung disease      Has been doing pulmonary rehab since COVID-19 infection in August 2022      Diet: mix for past few months (too many MD appts); grilled vegetables, salads; not much pork, but mostly chicken, fish       Exercise:  PT twice weekly, walking laps in house on other days    OBJCTIVE -PE, EKG, labs   Wt Readings from Last 3 Encounters:  12/10/21 186 lb 9.6 oz (  84.6 kg)  09/17/21 188 lb 3.2 oz (85.4 kg)  08/31/21 182 lb (82.6 kg)    Physical Exam: BP  138/70   Pulse 62   Ht 5\' 3"  (1.6 m)   Wt 186 lb 9.6 oz (84.6 kg)   BMI 33.05 kg/m  Physical Exam Vitals reviewed.  Constitutional:      General: She is not in acute distress.    Appearance: Normal appearance. She is obese. She is not ill-appearing (Well-groomed.  Well-nourished) or toxic-appearing.  HENT:     Head: Normocephalic and atraumatic.  Neck:     Vascular: No carotid bruit or JVD.  Cardiovascular:     Rate and Rhythm: Normal rate and regular rhythm. No extrasystoles are present.    Chest Wall: PMI is not displaced.     Pulses: Normal pulses.     Heart sounds: S1 normal and S2 normal. Heart sounds are distant. No murmur heard.    No gallop.  Pulmonary:     Effort: Pulmonary effort is normal. No respiratory distress.     Breath sounds: No wheezing, rhonchi or rales.     Comments: Mild diffuse interstitial breath sounds Chest:     Chest wall: No tenderness.  Musculoskeletal:        General: No swelling. Normal range of motion.     Cervical back: Normal range of motion and neck supple.  Skin:    General: Skin is warm and dry.  Neurological:     General: No focal deficit present.     Mental Status: She is alert and oriented to person, place, and time.     Gait: Gait normal.  Psychiatric:        Mood and Affect: Mood normal.        Behavior: Behavior normal.        Thought Content: Thought content normal.        Judgment: Judgment normal.     Adult ECG Report  Rate: 62 ;  Rhythm: normal sinus rhythm; low voltage, incomplete RBBB.  Cannot rule out inferior Mikan age-indeterminate.  Narrative Interpretation: Stable  Recent Labs: Lipids ordered today.-Reviewed Lab Results  Component Value Date   CHOL 167 12/10/2021   HDL 45 12/10/2021   LDLCALC 98 12/10/2021   TRIG 133 12/10/2021   CHOLHDL 3.7 12/10/2021   Lab Results  Component Value Date   CREATININE 0.68 08/31/2021   BUN 19 08/31/2021   NA 140 08/31/2021   K 4.3 08/31/2021   CL 105 08/31/2021   CO2  20 08/31/2021      Latest Ref Rng & Units 04/02/2021   12:35 AM 04/01/2021   11:01 AM 04/01/2021   10:53 AM  CBC  WBC 4.0 - 10.5 K/uL 12.0   9.9   Hemoglobin 12.0 - 15.0 g/dL 12.0  12.6  12.6   Hematocrit 36.0 - 46.0 % 35.2  37.0  37.3   Platelets 150 - 400 K/uL 223   278     Lab Results  Component Value Date   HGBA1C 6.7 (H) 04/01/2021   No results found for: "TSH"  ================================================== I spent a total of 30 minutes with the patient spent in direct patient consultation.  Additional time spent with chart review  / charting (studies, outside notes, etc): 16 min Total Time: 46 min  Current medicines are reviewed at length with the patient today.  (+/- concerns) n/a  Notice: This dictation was prepared with Dragon dictation along with smart phrase technology. Any transcriptional errors  that result from this process are unintentional and may not be corrected upon review.  Studies Ordered:   Orders Placed This Encounter  Procedures   EKG 12-Lead   No orders of the defined types were placed in this encounter.   Patient Instructions / Medication Changes & Studies & Tests Ordered   Patient Instructions  Medication Instructions:   Not needed  *If you need a refill on your cardiac medications before your next appointment, please call your pharmacy*   Lab Work:  Liver panel lipid     Testing/Procedures:  Not needed  Follow-Up: At [facility], you and your health needs are our priority.  As part of our continuing mission to provide you with exceptional heart care, we have created designated Provider Care Teams.  These Care Teams include your primary Cardiologist (physician) and Advanced Practice Providers (APPs -  Physician Assistants and Nurse Practitioners) who all work together to provide you with the care you need, when you need it.  We recommend signing up for the patient portal called "MyChart".  Sign up information is provided  on this After Visit Summary.  MyChart is used to connect with patients for Virtual Visits (Telemedicine).  Patients are able to view lab/test results, encounter notes, upcoming appointments, etc.  Non-urgent messages can be sent to your provider as well.   To learn more about what you can do with MyChart, go to [email or web address].    Your next appointment:   4 month(s)  The format for your next appointment:   In Person  Provider:   Bryan Lemma, MD    Other Instructions    Will send a order for 2- 3 month  of physical therapy     Marykay Lex, MD, MS Bryan Lemma, M.D., M.S. Interventional Cardiologist  [facility]  Pager # [phone] Phone # [phone] [address] [place], [place] [address]   Thank you for choosing [facility] [place]!!

## 2021-12-10 NOTE — Patient Instructions (Addendum)
Medication Instructions:   Not needed  *If you need a refill on your cardiac medications before your next appointment, please call your pharmacy*   Lab Work:  Liver panel lipid     Testing/Procedures:  Not needed  Follow-Up: At [facility], you and your health needs are our priority.  As part of our continuing mission to provide you with exceptional heart care, we have created designated Provider Care Teams.  These Care Teams include your primary Cardiologist (physician) and Advanced Practice Providers (APPs -  Physician Assistants and Nurse Practitioners) who all work together to provide you with the care you need, when you need it.  We recommend signing up for the patient portal called "MyChart".  Sign up information is provided on this After Visit Summary.  MyChart is used to connect with patients for Virtual Visits (Telemedicine).  Patients are able to view lab/test results, encounter notes, upcoming appointments, etc.  Non-urgent messages can be sent to your provider as well.   To learn more about what you can do with MyChart, go to [email or web address].    Your next appointment:   4 month(s)  The format for your next appointment:   In Person  Provider:   Bryan Lemma, MD    Other Instructions    Will send a order for 2- 3 month  of physical therapy

## 2021-12-17 ENCOUNTER — Encounter: Payer: Self-pay | Admitting: Cardiology

## 2021-12-17 NOTE — Assessment & Plan Note (Addendum)
No further anginal symptoms since her MI.  Has just had a long time to recover, partly complicated by the fact that she was still recovering from her COVID issues.  Baseline disability from her adrenal insufficiency...  Plan:   Continue DAPT (ASA/Brilinta) uninterrupted until January 2024  For now on atorvastatin, based on lipid panel we will adjust accordingly.  Not on beta-blocker because of fatigue issues and bradycardia.  Also not on ARB-BP had not required additional therapy.  She has ups and downs of her BPs.  Today's blood pressure 138/70 is high for her.  At home she usually is in the 110-120/ 70 mm range.  Due to labile pressures, will hold off on adding ARB.

## 2021-12-17 NOTE — Assessment & Plan Note (Signed)
   Continue DAPT (ASA/Brilinta) uninterrupted until January 2024   Following 1 year of uninterrupted DAPT (April 15, 2022), DC aspirin & reduced Thienopyridine to either maintenance dose of Brilinta 60 mg twice daily or convert to clopidogrel 75 mg daily  As of April 15, 2022, okay to hold antiplatelet agents for procedures or surgeries 5 to 7 days preop.

## 2021-12-17 NOTE — Assessment & Plan Note (Signed)
Significant nighttime bradycardia-probably sleep disordered breathing.  Resting heart rate 62.  Avoid beta-blocker

## 2021-12-17 NOTE — Assessment & Plan Note (Signed)
Unfortunately, she did not switch over to rosuvastatin either when I ordered it or when it was ordered by [facility].  We now lost the better part of the year without adequate coverage and we do not have recent labs to review.  I have ordered labs for today revealing an LDL of 98.  We will reorder rosuvastatin 20 mg daily, then add combination of Nexletol and ezetimibe after 2 weeks.  Reassess lipids 3 months following the addition.  If still not at goal, would then need to consider PCSK9 inhibitor or inclisiran.

## 2021-12-17 NOTE — Assessment & Plan Note (Signed)
Prolonged recovery requiring continued rehab.  We we will renew referral to [facility] physical therapy for additional 3 months.

## 2021-12-18 ENCOUNTER — Other Ambulatory Visit: Payer: Self-pay

## 2021-12-18 ENCOUNTER — Telehealth: Payer: Self-pay | Admitting: Cardiology

## 2021-12-18 DIAGNOSIS — E785 Hyperlipidemia, unspecified: Secondary | ICD-10-CM

## 2021-12-18 MED ORDER — EZETIMIBE 10 MG PO TABS: ORAL_TABLET | ORAL | 12 refills | Status: DC

## 2021-12-18 MED ORDER — NEXLETOL 180 MG PO TABS: 180.0000 mg | ORAL_TABLET | Freq: Every day | ORAL | 12 refills | Status: DC

## 2021-12-18 MED ORDER — ROSUVASTATIN CALCIUM 20 MG PO TABS: 20.0000 mg | ORAL_TABLET | Freq: Every day | ORAL | 12 refills | Status: DC

## 2021-12-18 NOTE — Telephone Encounter (Signed)
Patient called stating she would like a copy of her lab results faxed to Percell Belt at [facility], ph# [phone].  Patient stated she has an appointment there this morning at 9:00am.  Patient stated she currently does not have access to her MyChart.

## 2021-12-18 NOTE — Telephone Encounter (Signed)
Gave patient lab results (lipids and liver) per Dr. Herbie Baltimore. Medication orders explained to patient and sent to her preferred pharmacy. Lab work orders explained (lipids, liver panel) and mailed to her address. She will get these completed the week before her f/u appointment. Patient requested lab results faxed to her PCP. Verified fax number [phone]. Patient also needed help with MyChart. She does not have an email address and could not access it at this time.

## 2021-12-20 ENCOUNTER — Telehealth: Payer: Self-pay | Admitting: Cardiology

## 2021-12-20 NOTE — Telephone Encounter (Signed)
Since she only had 1 dose, recommend she retry again tomorrow to see if she has the same side effects

## 2021-12-20 NOTE — Telephone Encounter (Addendum)
Patient stated the fax sent to [facility] was not received, only the cover sheet went thorugh. Refaxed lipids and liver results. Patient stated she took Crestor last night and had dizziness, leg and hip cramps and joint aches. Recommended she not take any more of the Crestor and that I would send message to Dr. Herbie Baltimore and PharmD. She stated the symptoms are a bit less this morning, but she feels "yucky."

## 2021-12-20 NOTE — Telephone Encounter (Signed)
Pt c/o medication issue:  1. Name of Medication: rosuvastatin (CRESTOR) 20 MG tablet  2. How are you currently taking this medication (dosage and times per day)? Take 1 tablet (20 mg total) by mouth daily.  3. Are you having a reaction (difficulty breathing--STAT)? no  4. What is your medication issue? After taking this medication patient felt dizzy, had cramps in her calves and body aching. Please advise

## 2021-12-20 NOTE — Telephone Encounter (Signed)
LMTCB

## 2021-12-20 NOTE — Telephone Encounter (Addendum)
Patient stated she will not take any more Crestor. She is worried about her kidneys and the way she reacts to drugs. She is willing to take atorvastatin 20mg  daily again. She is changing her food to a Mediterranean diet and trying to exercise more. Please advise on statin.

## 2021-12-21 ENCOUNTER — Other Ambulatory Visit: Payer: Self-pay

## 2021-12-21 ENCOUNTER — Ambulatory Visit: Payer: Medicare Other

## 2021-12-21 DIAGNOSIS — G4710 Hypersomnia, unspecified: Secondary | ICD-10-CM | POA: Diagnosis not present

## 2021-12-21 MED ORDER — ATORVASTATIN CALCIUM 40 MG PO TABS: 40.0000 mg | ORAL_TABLET | Freq: Every day | ORAL | 3 refills | Status: DC

## 2021-12-21 NOTE — Telephone Encounter (Signed)
Patient stated she did not take a statin last night and feels better today. She is willing to take atorvastatin 40mg  and will contact if muscle cramps and pain occur. She has enough of the medication at home. I will update this on her med list.

## 2021-12-24 ENCOUNTER — Telehealth: Payer: Self-pay | Admitting: Cardiology

## 2021-12-24 NOTE — Telephone Encounter (Addendum)
Called to speak with Monica Harrison. Spoke with another stuff member. She states they faxed over Plan of Care for Dr. Herbie Baltimore to sign and fax back. It's dated 11/26/21. She wants to know has the fax been received. Will get message to Dr. Herbie Baltimore and his nurse for review.

## 2021-12-24 NOTE — Telephone Encounter (Signed)
I concur with Mr. Monica Harrison -unlikely to have side effect after taking 1 pill.  In addition, statins do not affect the kidneys.  However if she is willing to try to get higher dose of atorvastatin, we can see how that works.  But I suspect that we will probably need to consider moving forward with advanced treatment such as PCSK9 inhibitors or inclisiran.  I think in this case inclisiran may be a better option to ensure adherence.  Bryan Lemma, MD? [facility] f/u after relook labs.

## 2021-12-24 NOTE — Telephone Encounter (Signed)
Spoke with Monica Harrison December, she states she does not have the plan of Care.  Called Monica Harrison back, gave her a different fax number. She states she will fax it over now. Monica Harrison December made aware.

## 2021-12-24 NOTE — Telephone Encounter (Signed)
New Message:     Florentina Addison is calling to see if you received the orders that she faxed, first on 11-26-21 and again on 12-14-21? She needs them fax back to her asap please Please fax to [phone]

## 2021-12-25 ENCOUNTER — Telehealth: Payer: Self-pay

## 2021-12-25 DIAGNOSIS — E785 Hyperlipidemia, unspecified: Secondary | ICD-10-CM

## 2021-12-25 NOTE — Telephone Encounter (Signed)
Recommend referral to lipid clinic 

## 2021-12-25 NOTE — Telephone Encounter (Signed)
Faxed plan of care to  [facility]

## 2021-12-25 NOTE — Telephone Encounter (Signed)
Patient stated atorvastatin 40mg  made her very sleepy "drugged", calf and toes cramped, thighs sore. She went back on 20mg  yesterday. She is on a lipid-lowering diet or vegetables, gluten-free oatmeal, and plenty of fluids. She does not want to take injections because she reacts very bad to them. Affects BP, heart races, passes out. She wants an order for therapy [facility] (needs to be signed).  Patient wants to know if she should take atorvastatin 40mg  maybe twice a week. Call patient on cell phone [phone].

## 2021-12-25 NOTE — Telephone Encounter (Signed)
See other telephone notes - patient has referred to Lipid clinic for her options for cholesterol  management

## 2021-12-25 NOTE — Telephone Encounter (Signed)
Patient informed of referral to lipid clinic. She preferred Dr. Rennis Golden.

## 2021-12-25 NOTE — Telephone Encounter (Signed)
Order faxed to [facility] on 12/25/21

## 2021-12-25 NOTE — Addendum Note (Signed)
Addended by: Velora Mediate F on: 12/25/2021 03:07 PM   Modules accepted: Orders

## 2022-01-07 ENCOUNTER — Telehealth: Payer: Self-pay | Admitting: Pulmonary Disease

## 2022-01-07 DIAGNOSIS — G4710 Hypersomnia, unspecified: Secondary | ICD-10-CM | POA: Diagnosis not present

## 2022-01-07 NOTE — Telephone Encounter (Signed)
Call patient  Sleep study result  Date of study: 12/23/2021  Impression: Negative study for significant obstructive sleep apnea No significant oxygen desaturations  Recommendation:  Clinical follow-up of symptoms if pretest probability of significant sleep apnea is low  An in lab study may be considered if significant concern for sleep apnea remains  Encourage weight loss efforts  Regular exercises as tolerated  Follow-up as previously scheduled

## 2022-01-08 NOTE — Telephone Encounter (Signed)
Please let patient know that Sleep study does not show any sleep apnea or low oxygen levels at night.

## 2022-01-08 NOTE — Telephone Encounter (Signed)
I left a message for the patient to call back her call back for her results.

## 2022-01-09 NOTE — Telephone Encounter (Signed)
Called and spoke with patient.  Sleep study results given. Understanding stated. Patient has HRCT scheduled 02/21/22 and scheduled follow up with Dr. Vaughan Browner 02/25/22 to review results.

## 2022-01-17 ENCOUNTER — Telehealth: Payer: Self-pay | Admitting: Cardiology

## 2022-01-17 NOTE — Telephone Encounter (Signed)
Calling to get patient physical therapy plan act care. Please advise

## 2022-01-17 NOTE — Telephone Encounter (Signed)
[employer]. She is not in the office at present time. Will call later today.

## 2022-01-22 NOTE — Telephone Encounter (Signed)
Faxed - signed 12/2021  plan of care to [facility]

## 2022-01-23 NOTE — Telephone Encounter (Signed)
Spoke  to [place] - she received the documentation

## 2022-02-21 ENCOUNTER — Ambulatory Visit (HOSPITAL_COMMUNITY)
Admission: RE | Admit: 2022-02-21 | Discharge: 2022-02-21 | Disposition: A | Discharge status: Home / Self Care | Payer: Medicare Other | Source: Ambulatory Visit | Attending: Pulmonary Disease | Admitting: Pulmonary Disease

## 2022-02-21 DIAGNOSIS — J1282 Pneumonia due to coronavirus disease 2019: Secondary | ICD-10-CM | POA: Diagnosis present

## 2022-02-21 DIAGNOSIS — U071 COVID-19: Secondary | ICD-10-CM | POA: Diagnosis present

## 2022-02-21 DIAGNOSIS — J40 Bronchitis, not specified as acute or chronic: Secondary | ICD-10-CM | POA: Insufficient documentation

## 2022-02-25 ENCOUNTER — Encounter: Payer: Self-pay | Admitting: Pulmonary Disease

## 2022-02-25 ENCOUNTER — Ambulatory Visit (INDEPENDENT_AMBULATORY_CARE_PROVIDER_SITE_OTHER): Payer: Medicare Other | Admitting: Pulmonary Disease

## 2022-02-25 VITALS — BP 122/70 | HR 67 | Ht 63.0 in | Wt 187.4 lb

## 2022-02-25 DIAGNOSIS — J849 Interstitial pulmonary disease, unspecified: Secondary | ICD-10-CM

## 2022-02-25 DIAGNOSIS — U071 COVID-19: Secondary | ICD-10-CM | POA: Diagnosis not present

## 2022-02-25 DIAGNOSIS — J1282 Pneumonia due to coronavirus disease 2019: Secondary | ICD-10-CM

## 2022-02-25 DIAGNOSIS — I25119 Atherosclerotic heart disease of native coronary artery with unspecified angina pectoris: Secondary | ICD-10-CM

## 2022-02-25 NOTE — Progress Notes (Signed)
Monica Harrison    [id number]    December 26, 1950  Primary Care Physician:Grabowski, Baxter Hire, FNP  Referring Physician: Jones Bales, MD [address] [place] [address],  [place] [address]  Chief complaint: Follow-up for bronchitis, post COVID-19  HPI: 71 y.o.  with history of allergies, bronchitis, adrenal insufficiency [treated in [place]], Lyme's disease, angioedema  She has history of recurrent bronchitis, developed MRSA, E. coli pneumonia diagnosed with bronchoscopy in June 2017.  This was treated as an outpatient Has occasional seasonal allergies and is on albuterol which she uses occasionally. Has occasional snoring, especially when she is fatigued.  She was tested with a sleep apnea around 2015 and was told that she had mild obstructive events only when she was lying supine.  Currently not on treatment with CPAP  Hospitalized for COVID-19 in August of 2022.  Required lengthy treatment with IV steroids, remdesivir and baricitinib.  She required high flow nasal cannula which was weaned down and was discharged on oral prednisone taper and 2 L oxygen at night  She is enrolled in pulmonary rehab with some improvement in symptoms.  She still has some hair loss and is wearing a break Had a follow-up CT showing post-COVID changes.  Echocardiogram was also done for evaluation of enlarged PA on CT scan.  There is no evidence of pulmonary hypertension.  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  Underwent cardiac catheterization with stenting of RCA.  She has followed up with cardiology at [facility].  Pets: Dog Occupation: Operates a [employer] Exposures: No mold, hot tub, Jacuzzi.  No feather pillows or comforter Smoking history: Never smoker Travel history: No significant travel history Relevant family history: No significant family history of lung disease  Interim history: States that she is doing well, breathing  stable Continues with cardiac, pulmonary rehab.  Breathing is stable  Sleep study earlier this year did not show desats or sleep apnea.  Outpatient Encounter Medications as of 02/25/2022  Medication Sig   albuterol (PROVENTIL) (2.5 MG/3ML) 0.083% nebulizer solution Take 3 mLs (2.5 mg total) by nebulization every 6 (six) hours as needed for wheezing or shortness of breath.   albuterol (VENTOLIN HFA) 108 (90 Base) MCG/ACT inhaler Inhale 2 puffs into the lungs every 6 (six) hours as needed for wheezing or shortness of breath.   acetaminophen (TYLENOL) 325 MG tablet Take 650 mg by mouth every 6 (six) hours as needed.   ALPRAZolam (XANAX) 0.25 MG tablet Take 0.25 mg by mouth at bedtime.   amoxicillin-clavulanate (AUGMENTIN) 875-125 MG tablet Take 1 tablet by mouth 2 (two) times daily. (Patient not taking: Reported on 02/25/2022)   Ascorbic Acid (VITAMIN C) 1000 MG tablet Take 2,000 mg by mouth 2 (two) times daily.   aspirin 81 MG chewable tablet Chew 1 tablet (81 mg total) by mouth daily.   atorvastatin (LIPITOR) 40 MG tablet Take 1 tablet (40 mg total) by mouth daily.   B Complex Vitamins (B COMPLEX 50 PO) Take 1 tablet by mouth daily.    Bempedoic Acid (NEXLETOL) 180 MG TABS Take 180 mg by mouth daily. Start taking 2 weeks after taking rosuvastatin.   budesonide (RHINOCORT AQUA) 32 MCG/ACT nasal spray Place 1 spray into both nostrils daily as needed for rhinitis. (Patient not taking: Reported on 02/25/2022)   Calcium-Magnesium-Zinc (CAL-MAG-ZINC PO) Take 2 tablets by mouth at bedtime.   Cholecalciferol (VITAMIN D3) 5000 units CAPS Take 5,000 Units  by mouth at bedtime.   Coenzyme Q10 (CO Q 10) 100 MG CAPS Take 100 mg by mouth every evening.    diclofenac Sodium (VOLTAREN) 1 % GEL Apply 1 application. topically 4 (four) times daily.   Digestive Enzymes (ENZYME DIGEST) CAPS Take 1 capsule by mouth 2 (two) times daily with a meal.    ezetimibe (ZETIA) 10 MG tablet Start this medication 2 weeks after  taking rosuvastatin. Take one tablet (10mg ) daily.   fluconazole (DIFLUCAN) 150 MG tablet Take one tablet as needed for yeast infection: May repeat in 72 hours if needed   hydrocortisone (CORTEF) 5 MG tablet Take 5 mg by mouth See admin instructions. TAKE 10MG  IN THE MORNING AND 5 MG IN THE AFTERNOON.   Magnesium 400 MG TABS Take 400 mg by mouth at bedtime.   Multiple Vitamin (MULTIVITAMIN WITH MINERALS) TABS tablet Take 3 tablets by mouth daily. Pure-[place]   nitroGLYCERIN (NITROSTAT) 0.4 MG SL tablet Place 1 tablet (0.4 mg total) under the tongue every 5 (five) minutes as needed.   nystatin cream (MYCOSTATIN) Apply 1 application. topically 2 (two) times daily as needed for rash.   Omega-3 Fatty Acids (OMEGA 3 PO) Take 2 capsules by mouth daily.   OXYGEN Inhale into the lungs. 2L only at night   Polyethyl Glycol-Propyl Glycol 0.4-0.3 % SOLN Place 1 drop into both eyes 4 (four) times daily.    polyethylene glycol (MIRALAX / GLYCOLAX) 17 g packet Take 17 g by mouth daily as needed for mild constipation.   Probiotic Product (PROBIOTIC PO) Take 1 capsule by mouth every evening.    thyroid (ARMOUR) 30 MG tablet Take 30 mg by mouth daily before breakfast.   ticagrelor (BRILINTA) 90 MG TABS tablet Take 1 tablet (90 mg total) by mouth 2 (two) times daily.   vitamin A UNIT capsule Take 10,000 Units by mouth every evening.    No facility-administered encounter medications on file as of 02/25/2022.    Physical Exam: Blood pressure 122/70, pulse 67, height 5\' 3"  (1.6 m), weight 187 lb 6.4 oz (85 kg), SpO2 97 %. Gen:      No acute distress HEENT:  EOMI, sclera anicteric Neck:     No masses; no thyromegaly Lungs:    Clear to auscultation bilaterally; normal respiratory effort CV:         Regular rate and rhythm; no murmurs Abd:      + bowel sounds; soft, non-tender; no palpable masses, no distension Ext:    No edema; adequate peripheral perfusion Skin:      Warm and dry; no rash Neuro: alert and  oriented x 3 Psych: normal mood and affect   Data Reviewed: Imaging: Chest x-ray 09/03/2015- mild atelectasis at the left base.   High-resolution CT 06/21/2020-no evidence of interstitial lung disease, mild centrilobular nodularity, 20 pulmonary reduce measuring 4 mm High-res CT 02/01/2021-basilar predominant peripheral groundglass, linear densities consistent with COVID-19.  Enlarged pulmonary artery Chest x-ray 04/01/2021-improvement in bilateral airspace disease Chest x-ray 07/22/2021-continued improvement of bilateral opacities. Hydration CT 02/21/2022-improvement in inflammatory changes.  There are residual mild groundglass, subpleural reticulation in the peripheries. I reviewed the images personally.  PFTs: 09/26/2015 FVC 3.40 [116%], FEV1 2.64 [125%], F/F 78 Normal spirometry  08/31/2021 FVC 2.76 [98%], FEV1 1.86 [88%], F/F 67, TLC 5.05 [103%], DLCO 17.32 [92%] Mild obstructive airway disease with bronchodilator response  Cardiac: Echocardiogram 02/16/2021 LVEF 60-65%, mild LVH, grade 1 diastolic dysfunction.  Normal PA systolic pressure  Sleep: Home sleep study  12/23/2021-no evidence of sleep apnea, no desats at night.  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  This is new from March 2022 and is consistent with post-COVID changes Continue pulmonary rehab  Off oxygen during daytime.  She can also stop oxygen at night as written sleep study does not show nocturnal desats.  Daytime fatigue No evidence of sleep apnea on home sleep study.  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  Echo reviewed with no evidence of pulmonary hypertension  Bronchitis She has a history of asthma, COPD on record but no objective evidence of obstruction on PFTs Occasionally needs to use albuterol inhaler, not on controller medication  PFTs reviewed with mild obstruction bronchodilator response assist of  asthma.  We discussed starting controller medication but since she is doing well she wants to hold off for now.  Continue albuterol as needed  Plan/Recommendations: High-resolution CT in 12 months  Follow-up in clinic in 6 months.  Chilton Greathouse MD [facility] 02/25/2022, 10:25 AM  CC: Jones Bales, MD

## 2022-02-25 NOTE — Patient Instructions (Signed)
There is stable with your breathing I had reviewed his CT which shows very minimal residual scarring from COVID-19 Will order follow-up high-res CT in 1 year Return to clinic in 6 months.

## 2022-03-14 ENCOUNTER — Telehealth: Payer: Self-pay | Admitting: *Deleted

## 2022-03-14 NOTE — Telephone Encounter (Signed)
Patient walked in requesting Brilinta samples.    Samples placed at front desk for pickup  Patient also states she dropped off patient assistance paperwork today.

## 2022-04-10 ENCOUNTER — Telehealth: Payer: Self-pay | Admitting: Pulmonary Disease

## 2022-04-10 MED ORDER — AMOXICILLIN-POT CLAVULANATE 875-125 MG PO TABS: 1.0000 | ORAL_TABLET | Freq: Two times a day (BID) | ORAL | 0 refills | Status: AC

## 2022-04-10 MED ORDER — BENZONATATE 200 MG PO CAPS: 200.0000 mg | ORAL_CAPSULE | Freq: Three times a day (TID) | ORAL | 1 refills | Status: DC | PRN

## 2022-04-10 NOTE — Telephone Encounter (Signed)
Patient returning call again. Please advise

## 2022-04-10 NOTE — Telephone Encounter (Signed)
Called and spoke with patient. She stated that her husband was sick with a sinus infection last week and now she has it. Her symptoms started on Christmas Eve. She has a productive cough with yellow phlegm. She also has some post nasal drip. She denied having any fever or body aches. She has tested herself numerous times for covid and all of her tests have been negative. Also denied any increased SOB or wheezing.   She has been using her Neti pot as well as her albuterol neb. Solution twice daily for some relief.   She is requesting a round of augmentin as well as some Tessalon perles for her cough.   [facility] [place].   Dr. Thora Lance, can you please advise since Dr. Isaiah Serge is not available today? Thanks!

## 2022-04-10 NOTE — Telephone Encounter (Signed)
Patient called to ask if she can get a refill for her Augmentin.  She has been coughing phlegm and stated she is all out of the medication.  CB# [phone]

## 2022-04-10 NOTE — Telephone Encounter (Signed)
Prescriptions for tessalon perles and augmentin sent to pharmacy

## 2022-04-10 NOTE — Telephone Encounter (Signed)
Called and spoke with patient. She verbalized understanding.   Nothing further needed at time of call.  

## 2022-04-10 NOTE — Telephone Encounter (Signed)
Patient is calling back because she missed the call.  She apologized and would still like a call back.  Patient stated that the nurse can also call the home # at [phone]

## 2022-04-10 NOTE — Telephone Encounter (Signed)
Called patient this morning and left voicemail for her to call office back to go over her symptoms she is having in regards to her wanting more Augmentin.

## 2022-04-17 ENCOUNTER — Other Ambulatory Visit (HOSPITAL_COMMUNITY)
Admission: RE | Admit: 2022-04-17 | Discharge: 2022-04-17 | Disposition: A | Discharge status: Home / Self Care | Payer: Medicare Other | Source: Ambulatory Visit | Attending: Cardiology | Admitting: Cardiology

## 2022-04-17 DIAGNOSIS — E785 Hyperlipidemia, unspecified: Secondary | ICD-10-CM | POA: Insufficient documentation

## 2022-04-17 LAB — HEPATIC FUNCTION PANEL
ALT: 37 U/L (ref 0–44)
AST: 37 U/L (ref 15–41)
Albumin: 4 g/dL (ref 3.5–5.0)
Alkaline Phosphatase: 68 U/L (ref 38–126)
Bilirubin, Direct: <0.1 mg/dL (ref 0.0–0.2)
Total Bilirubin: 0.7 mg/dL (ref 0.3–1.2)
Total Protein: 7.6 g/dL (ref 6.5–8.1)

## 2022-04-17 LAB — LIPID PANEL
Cholesterol: 138 mg/dL (ref 0–200)
HDL: 36 mg/dL — ABNORMAL LOW (ref >40)
LDL Cholesterol: 62 mg/dL (ref 0–99)
Total CHOL/HDL Ratio: 3.8 RATIO
Triglycerides: 199 mg/dL — ABNORMAL HIGH (ref <150)
VLDL: 40 mg/dL (ref 0–40)

## 2022-04-18 ENCOUNTER — Telehealth: Payer: Self-pay | Admitting: Cardiology

## 2022-04-18 NOTE — Telephone Encounter (Signed)
Spoke with Joellen Jersey regarding plan of care needing to be signed by Dr. Ellyn Hack. Joellen Jersey states that she sent it on Dec. 5th and again on Dec. 19th. Explained that with the holidays Dr. Ellyn Hack hasn't been in the office. Will forward to primary RN to follow up. Katie verbalizes understanding.

## 2022-04-18 NOTE — Telephone Encounter (Signed)
Calling because the patient Physical therapy revaluation plan of care has not been signed Faxed [phone]. Please advise

## 2022-04-22 ENCOUNTER — Encounter: Payer: Self-pay | Admitting: Cardiology

## 2022-04-22 ENCOUNTER — Ambulatory Visit: Payer: Medicare Other | Attending: Cardiology | Admitting: Cardiology

## 2022-04-22 VITALS — BP 132/78 | HR 67 | Ht 62.0 in | Wt 187.2 lb

## 2022-04-22 DIAGNOSIS — E785 Hyperlipidemia, unspecified: Secondary | ICD-10-CM | POA: Diagnosis present

## 2022-04-22 DIAGNOSIS — E118 Type 2 diabetes mellitus with unspecified complications: Secondary | ICD-10-CM | POA: Diagnosis not present

## 2022-04-22 DIAGNOSIS — I2510 Atherosclerotic heart disease of native coronary artery without angina pectoris: Secondary | ICD-10-CM | POA: Diagnosis not present

## 2022-04-22 DIAGNOSIS — Z955 Presence of coronary angioplasty implant and graft: Secondary | ICD-10-CM

## 2022-04-22 DIAGNOSIS — E2740 Unspecified adrenocortical insufficiency: Secondary | ICD-10-CM

## 2022-04-22 DIAGNOSIS — I2119 ST elevation (STEMI) myocardial infarction involving other coronary artery of inferior wall: Secondary | ICD-10-CM

## 2022-04-22 NOTE — Patient Instructions (Addendum)
Medication Instructions:   Stop taking Aspirin     If possible  will decrease Brilinta to 60 mg twice a day - will contact [facility]   *If you need a refill on your cardiac medications before your next appointment, please call your pharmacy*   Lab Work: Not needed    Testing/Procedures:  Not needed  Follow-Up: At [facility], you and your health needs are our priority.  As part of our continuing mission to provide you with exceptional heart care, we have created designated Provider Care Teams.  These Care Teams include your primary Cardiologist (physician) and Advanced Practice Providers (APPs -  Physician Assistants and Nurse Practitioners) who all work together to provide you with the care you need, when you need it.     Your next appointment:   6 month(s)  The format for your next appointment:   In Person  Provider:   Glenetta Hew, MD    Other Instructions   Discuss with Your Primary about your Hemoglobin A1c ( glycemic index)  Last numbers 6.7  Okay to do therapy twice a week

## 2022-04-22 NOTE — Progress Notes (Signed)
Primary Care Provider: Tacy Learn, FNP [facility] Cardiologist: Bryan Lemma, MD Electrophysiologist: None  Clinic Note: Chief Complaint  Patient presents with   Follow-up    8-month follow-up doing well.   Coronary Artery Disease    No active angina.   ===================================  ASSESSMENT/PLAN   Problem List Items Addressed This Visit       Cardiology Problems   ST elevation myocardial infarction (STEMI) of inferolateral wall (HCC) (Chronic)    She is now just over a year out from her MI with PCI.  Doing remarkably well.  Gradually building up her energy level which is probably not related to cardiac issues but from other conditions.  Doing physical therapy.  Also being followed by pulmonary medicine.  Hyperdynamic LV function with no significant wall motion abnormalities noted on echo.  Just limited right atrial pressures and right ventricular pressures-likely related to pulmonary issues.  No active angina or heart failure.      Relevant Medications   atorvastatin (LIPITOR) 20 MG tablet   Coronary artery disease involving native heart without angina pectoris - Primary (Chronic)    Just over 1 year out from her inferolateral STEMI with single-vessel disease-with known RCA treated with a long 30 mm stent covering several lesions. Other than her baseline disability from adrenal insufficiency and some pulmonary issues, she is doing well.  No active angina. Lipids have actually gotten much better controlled as well on current dose of statin plus Zetia.  She never did start taking Nexletol.  Plan: On stable dose atorvastatin, however with some fatigue we will try to do a short statin holiday and reduce dose-May need to consider adding Nexletol. Not on beta-blocker because of fatigue and baseline heart rate. Actually not on any BP medications at all.  BP has been stable.  Low threshold to consider ARB if pressures do increase. After completing 1  year of uninterrupted DAPT with aspirin and Brilinta, we will discontinue aspirin and reduce Brilinta to 60 mg twice daily after completion of current prescription. Okay to hold Brilinta 5 to 7 days preop for surgeries or procedures.       Relevant Medications   atorvastatin (LIPITOR) 20 MG tablet   Hyperlipidemia with target LDL less than 70 (Chronic)    She never did switch over to rosuvastatin, stable on atorvastatin 40 mg daily along with Zetia.  Never started Nexletol.  Most recent lipids showed an LDL of 60 with mildly elevated triglycerides at 199 but otherwise lipids look fine.  With her having some fatigue and myalgias I wanted to see how she does with reduced dose of statin, if she tolerates that well, would then want to switch her to rosuvastatin plus or minus add Nexletol to maintain.  Plan: Statin holiday for 2 weeks, then reduce dose to 20 mg atorvastatin and reassess symptoms. => Will need recheck labs prior to 48-month follow-up.  If not at goal at that time, would then switch to rosuvastatin, and/or add Nexletol      Relevant Medications   atorvastatin (LIPITOR) 20 MG tablet     Other   Presence of drug coated stent in right coronary artery (Chronic)    Has completed 1 year of uninterrupted DAPT =>  DC aspirin Continue Brilinta, but reduce dose to 60 mg twice daily to complete second year. Okay to hold Brilinta 5 to 7 days preop for surgeries or procedures.      Type 2 diabetes mellitus with complication, without long-term current use  of insulin ([place]) (Chronic)    Has not had A1c checked since the time of her MI.  At that time it was 6.7.  Has not been on any medications.  Will recheck A1c with current labs.  If it remains elevated, will recommend PCP address therapy. Would consider SGLT2 inhibitor or GLP-1 agonist.      Relevant Medications   atorvastatin (LIPITOR) 20 MG tablet   Adrenal insufficiency (HCC) (Chronic)    Doing well and continued rehab.  We will  increase her rehab days to 2 days a week. [facility]       ===================================  HPI:    Monica Harrison is a 72 y.o. female with a PMH notable for CAD-inferior STEMI (RCA PCI December 2022), HTN, HLD, Chronic Renal Insufficiency, Chronic Fatigue-Sleep Disorder Breathing (mild nocturnal hypoxia-not on CPAP) who presents today for for high 10-month follow-up at the request of Zane Herald, MD.  04/01/2021: Inferior STEMI: 100% proximal-mid RCA-DES PCI.  EF 65 to 70%. Zio patch placed because of bradycardia, no beta-blocker. Has been referred [facility].  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  She had suffered a fall shortly after I had seen her in April.  Had a large bruise and having a hard time recovering getting back to normal activity.  Gradually stronger.-I extended her referral for physical therapy for another 3 months.  Noting improved dyspnea.  No anginal pain.  Just some generalized fatigue that predated her MI.  No PND orthopnea or edema.  Recent Hospitalizations: None  Reviewed  CV studies:    The following studies were reviewed today: (if available, images/films reviewed: From Epic Chart or Care Everywhere) None:  Interval History:   Monica Harrison returns today actually doing fairly well.  She says that she is happy with the blood pressures at home being anywhere from the 120s to low 130s over 70s.  She is doing 1 day a week physical therapy and was hoping to try to do more than that-2 days a week because it is definitely helping out her exercise tolerance.  She is also doing some walking on her own.  She will get winded if she goes too far, but has not had any chest pain or pressure with rest or exertion.  No resting dyspnea, just some exercise intolerance with exercise dyspnea if she overdoes it.  Not with routine  walking.  She denies any PND, orthopnea or edema.  No rapid irregular heartbeats or palpitations.  No syncope or near syncope.  No TIA or amaurosis fugax symptoms.  No claudication symptoms.  REVIEWED OF SYSTEMS   Pertinent noncardiac symptoms: Otherwise negative ROS. Fatigue/malaise Still gets "winded" with increased activity.  But cough is better. Less prominent myalgias. Somewhat nervous and anxious but better.  I have reviewed and (if needed) personally updated the patient's problem list, medications, allergies, past medical and surgical history, social and family history.   PAST MEDICAL HISTORY   Past Medical History:  Diagnosis Date   Adrenal insufficiency (HCC)    Bronchitis, mucopurulent recurrent (HCC)    CAD (coronary artery disease), native coronary artery 04/01/2021   a. s/p Inferior STEMI in 03/2021: 100% p-mRCA (DES PCI); Residual 10% prox LCx stenosis and 20% mid LCx stenosis   Hyperlipidemia    MRSA pneumonia ([place]) 09/2015   Associated with a E. coli  pneumonia-diagnosed by bronchoscopy.   OSA (obstructive sleep apnea) 2015   Sleep study showed mild obstructive events only when lying supine => not on CPAP.   Pneumonia due to COVID-19 virus 11/2018   Hospitalized August 2022 for COVID-19-required lengthy treatment with IV steroids along with remdesivir and baricitinib.  She eventually discharged home on oral prednisone taper and nightly 2 L [place] oxygen. 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  One 3.1-second pause at 8 AM.  Sinus bradycardia with junctional  escape beats down to 28 bpm noted between 630 and 7:30 AM.  Junctional rhythm bradycardia noted at 2 AM.  Occasional PACs (2.2%) with 2 short atrial runs fastest and longest was 7 beats at 128 bpm. = Bradycardia & Pauses reviewed & cleared by EP - avoid AVN agents   CORONARY/GRAFT ACUTE MI REVASCULARIZATION N/A 04/01/2021   Procedure: Coronary/Graft Acute MI Revascularization;  Surgeon: Marykay Lex, MD;  Location[facility]VAS[facility] CV LAB;  Service: Cardiovascular;  Successful DES PCI of the proximal RCA using 2.75 mm x 30 mm DES stent postdilated to 3.1 mm.   LEFT HEART CATH AND CORONARY ANGIOGRAPHY N/A 04/01/2021   Procedure: LEFT HEART CATH AND CORONARY ANGIOGRAPHY;  Surgeon: Marykay Lex, MD;  Location: [facility] INVASIVE CV LAB;  Service: Cardiovascular; Severe single-vessel CAD with 100% thrombotic occlusion of the prox RCA (-> DES PCI); Preserved LVEF; Borderline hypotension requiring Levophed infusion.   TRANSTHORACIC ECHOCARDIOGRAM  04/02/2021   (Immediately post Inferior STEMI) hyperdynamic LV, EF 65 to 70%.  No RWMA.  Moderate basal septal hypertrophy.  Indeterminate diastolic pressures (grade 1 diastolic function).  Mildly reduced RV function but unable assess PAP.  RAP estimated at 15 mmHg with mild right atrial dilation.  Normal mitral and aortic valves.   Cath-PCI: 04/02/2021:  Severe single-vessel CAD with 100% thrombotic occlusion of the proximal RCA; Successful DES PCI of the proximal RCA using 2.75 mm x 30 mm DES stent postdilated to 3.1 mm. Preserved LVEF; Borderline hypotension requiring Levophed infusion.   There is no immunization history on file for this patient.  MEDICATIONS/ALLERGIES   Current Meds  Medication Sig   acetaminophen (TYLENOL) 325 MG tablet Take 650 mg by mouth every 6 (six) hours as needed.   albuterol (PROVENTIL) (2.5 MG/3ML) 0.083% nebulizer solution Take 3 mLs (2.5 mg total) by nebulization every 6 (six) hours as needed for wheezing or shortness of  breath.   ALPRAZolam (XANAX) 0.25 MG tablet Take 0.25 mg by mouth at bedtime.   amoxicillin-clavulanate (AUGMENTIN) 875-125 MG tablet Take 1 tablet by mouth 2 (two) times daily.   Ascorbic Acid (VITAMIN C) 1000 MG tablet Take 2,000 mg by mouth 2 (two) times daily.   aspirin 81 MG chewable tablet Chew 1 tablet (81 mg total) by mouth daily.   atorvastatin (LIPITOR) 40 MG tablet Take 1/1 tablet (40 mg total) by mouth daily. 20 mg   B Complex Vitamins (B COMPLEX 50 PO) Take 1 tablet by mouth daily.    Calcium-Magnesium-Zinc (CAL-MAG-ZINC PO) Take 2 tablets by mouth  at bedtime.   Cholecalciferol (VITAMIN D3) 5000 units CAPS Take 5,000 Units by mouth at bedtime.   Coenzyme Q10 (CO Q 10) 100 MG CAPS Take 100 mg by mouth every evening.    diclofenac Sodium (VOLTAREN) 1 % GEL Apply 1 application. topically 4 (four) times daily.   Digestive Enzymes (ENZYME DIGEST) CAPS Take 1 capsule by mouth 2 (two) times daily with a meal.    hydrocortisone (CORTEF) 5 MG tablet Take 5 mg by mouth See admin instructions. TAKE 10MG  IN THE MORNING AND 5 MG IN THE AFTERNOON.   Magnesium 400 MG TABS Take 400 mg by mouth at bedtime.   Multiple Vitamin (MULTIVITAMIN WITH MINERALS) TABS tablet Take 3 tablets by mouth daily. Pure-[place]   nystatin cream (MYCOSTATIN) Apply 1 application. topically 2 (two) times daily as needed for rash.   Omega-3 Fatty Acids (OMEGA 3 PO) Take 2 capsules by mouth daily.   Polyethyl Glycol-Propyl Glycol 0.4-0.3 % SOLN Place 1 drop into both eyes 4 (four) times daily.    polyethylene glycol (MIRALAX / GLYCOLAX) 17 g packet Take 17 g by mouth daily as needed for mild constipation.   Probiotic Product (PROBIOTIC PO) Take 1 capsule by mouth every evening.    thyroid (ARMOUR) 30 MG tablet Take 30 mg by mouth daily before breakfast.   ticagrelor (BRILINTA) 90 MG TABS tablet Take 1 tablet (90 mg total) by mouth 2 (two) times daily.   vitamin A UNIT capsule Take 10,000 Units by mouth every evening.      Allergies  Allergen Reactions   Avelox [Moxifloxacin Hcl In Nacl] Anaphylaxis   Biaxin [Clarithromycin] Anaphylaxis   Lipitor [Atorvastatin]     myalgias   Lactose Intolerance (Gi) Other (See Comments)    Bloating, gas, congestion   Tobramycin Other (See Comments)    Severe headaches    Yeast-Related Products Swelling    Tongue swelling /candida    SOCIAL HISTORY/FAMILY HISTORY   Reviewed in Epic:  Pertinent findings:  Social History   Tobacco Use   Smoking status: Never   Smokeless tobacco: Never  Vaping Use   Vaping Use: Never used  Substance Use Topics   Alcohol use: No   Drug use: No   Social History   Social History Narrative   Pets: Dog   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  No feather pillows or comforter   Smoking history: Never smoker   Travel history: No significant travel history   Relevant family history: No significant family history of lung disease      Has been doing pulmonary rehab since COVID-19 infection in August 2022      Diet: mix for past few months (too many MD appts); grilled vegetables, salads; not much pork, but mostly chicken, fish       Exercise:  PT twice weekly, walking laps in house on other days    OBJCTIVE -PE, EKG, labs   Wt Readings from Last 3 Encounters:  04/22/22 187 lb 3.2 oz (84.9 kg)  02/25/22 187 lb 6.4 oz (85 kg)  12/10/21 186 lb 9.6 oz (84.6 kg)    Physical Exam: BP 132/78 (BP Location: Left Arm, Patient Position: Sitting, Cuff Size: Large)   Pulse 67   Ht 5\' 2"  (1.575 m)   Wt 187 lb 3.2 oz (84.9 kg)   SpO2 99%   BMI 34.24 kg/m  Physical Exam Vitals reviewed.  Constitutional:  General: She is not in acute distress.    Appearance: Normal appearance. She is obese. She is not ill-appearing (Well-nourished and well-groomed.), toxic-appearing or diaphoretic.  HENT:     Head: Normocephalic and atraumatic.  Neck:     Vascular: No carotid bruit or JVD.   Cardiovascular:     Rate and Rhythm: Normal rate and regular rhythm. No extrasystoles are present.    Chest Wall: PMI is not displaced.     Pulses: Intact distal pulses.     Heart sounds: S1 normal and S2 normal. Heart sounds are distant. No murmur heard.    No friction rub. No gallop.  Pulmonary:     Effort: Pulmonary effort is normal. No respiratory distress.     Breath sounds: No wheezing, rhonchi or rales.     Comments: Mild diffuse adventitial lung sounds.  Otherwise CTAB.  Nonlabored good air movement. Musculoskeletal:        General: No swelling.     Cervical back: Normal range of motion and neck supple.  Skin:    General: Skin is warm and dry.  Neurological:     General: No focal deficit present.     Mental Status: She is alert and oriented to person, place, and time.     Gait: Gait normal.  Psychiatric:        Mood and Affect: Mood normal.        Behavior: Behavior normal.        Thought Content: Thought content normal.        Judgment: Judgment normal.     Adult ECG Report Not checked  Recent Labs:  reviewed  Lab Results  Component Value Date   CHOL 138 04/17/2022   HDL 36 (L) 04/17/2022   LDLCALC 62 04/17/2022   TRIG 199 (H) 04/17/2022   CHOLHDL 3.8 04/17/2022   Lab Results  Component Value Date   CREATININE 0.68 08/31/2021   BUN 19 08/31/2021   NA 140 08/31/2021   K 4.3 08/31/2021   CL 105 08/31/2021   CO2 20 08/31/2021      Latest Ref Rng & Units 04/02/2021   12:35 AM 04/01/2021   11:01 AM 04/01/2021   10:53 AM  CBC  WBC 4.0 - 10.5 K/uL 12.0   9.9   Hemoglobin 12.0 - 15.0 g/dL 96.012.0  45.412.6  09.812.6   Hematocrit 36.0 - 46.0 % 35.2  37.0  37.3   Platelets 150 - 400 K/uL 223   278     Lab Results  Component Value Date   HGBA1C 6.7 (H) 04/01/2021   No results found for: "TSH"  ================================================== I spent a total of 24 minutes with the patient spent in direct patient consultation.  Additional time spent with chart  review  / charting (studies, outside notes, etc): 14 min Total Time: 38 min  Current medicines are reviewed at length with the patient today.  (+/- concerns) none  Notice: This dictation was prepared with Dragon dictation along with smart phrase technology. Any transcriptional errors that result from this process are unintentional and may not be corrected upon review.  Studies Ordered:   No orders of the defined types were placed in this encounter.  No orders of the defined types were placed in this encounter.   Patient Instructions / Medication Changes & Studies & Tests Ordered   Patient Instructions  Medication Instructions:   Stop taking Aspirin     If possible  will decrease Brilinta to 60 mg twice a  day - will contact [facility]   *If you need a refill on your cardiac medications before your next appointment, please call your pharmacy*   Lab Work: Not needed    Testing/Procedures:  Not needed  Follow-Up: At [facility], you and your health needs are our priority.  As part of our continuing mission to provide you with exceptional heart care, we have created designated Provider Care Teams.  These Care Teams include your primary Cardiologist (physician) and Advanced Practice Providers (APPs -  Physician Assistants and Nurse Practitioners) who all work together to provide you with the care you need, when you need it.     Your next appointment:   6 month(s)  The format for your next appointment:   In Person  Provider:   Bryan Lemma, MD    Other Instructions   Discuss with Your Primary about your Hemoglobin A1c ( glycemic index)  Last numbers 6.7  Okay to do therapy twice a week     Marykay Lex, MD, MS Bryan Lemma, M.D., M.S. Interventional Cardiologist  [facility]  Pager # [phone] Phone # [phone] [address] [place], [place] [address]   Thank you for choosing [facility] [place]!!

## 2022-04-24 ENCOUNTER — Telehealth: Payer: Self-pay | Admitting: *Deleted

## 2022-04-24 MED ORDER — BRILINTA 60 MG PO TABS: 60.0000 mg | ORAL_TABLET | Freq: Two times a day (BID) | ORAL | 3 refills | Status: DC

## 2022-04-24 NOTE — Telephone Encounter (Signed)
Dr Ellyn Hack  switched  dosage of Brilinta to 60 mg from 90 mg twice a day .   Patient received confirmation  that she was able to receive patient assistance  for [facility] [employer] for year supply  Patient received the first shipment .   RN called  800#   Representative  states  a new prescription ( 60 mg twice a day ) will need to faxed into company - fax# [phone]  Patient ID#  [phone]  Representative states she will deactivated to 90 mg dose. Prescription will be faxed once dr harding sign

## 2022-04-24 NOTE — Telephone Encounter (Signed)
Patient is aware prescription will be faxed. And phone given to patient to call when the next bottle is needed.

## 2022-05-03 NOTE — Telephone Encounter (Signed)
Patient is aware  new prescripiton has been faxed  She will complete the 90 mg talbet she has then start with the 60 mg. She will start round March 2024 . She just received a 90 day supply of the 90 mg.

## 2022-05-03 NOTE — Telephone Encounter (Signed)
Faxed signed prescription to [facility] for 60 mg Brilinta twice a day  with 3 refills  Discontinue 90 mg dose

## 2022-05-05 ENCOUNTER — Encounter: Payer: Self-pay | Admitting: Cardiology

## 2022-05-05 NOTE — Assessment & Plan Note (Signed)
She is now just over a year out from her MI with PCI.  Doing remarkably well.  Gradually building up her energy level which is probably not related to cardiac issues but from other conditions.  Doing physical therapy.  Also being followed by pulmonary medicine.  Hyperdynamic LV function with no significant wall motion abnormalities noted on echo.  Just limited right atrial pressures and right ventricular pressures-likely related to pulmonary issues.  No active angina or heart failure.

## 2022-05-05 NOTE — Assessment & Plan Note (Signed)
Has completed 1 year of uninterrupted DAPT =>  DC aspirin Continue Brilinta, but reduce dose to 60 mg twice daily to complete second year. Okay to hold Brilinta 5 to 7 days preop for surgeries or procedures.

## 2022-05-05 NOTE — Assessment & Plan Note (Signed)
Doing well and continued rehab.  We will increase her rehab days to 2 days a week. [facility]

## 2022-05-05 NOTE — Assessment & Plan Note (Addendum)
Has not had A1c checked since the time of her MI.  At that time it was 6.7.  Has not been on any medications.  Will recheck A1c with current labs.  If it remains elevated, will recommend PCP address therapy. Would consider SGLT2 inhibitor or GLP-1 agonist.

## 2022-05-05 NOTE — Assessment & Plan Note (Signed)
Just over 1 year out from her inferolateral STEMI with single-vessel disease-with known RCA treated with a long 30 mm stent covering several lesions. Other than her baseline disability from adrenal insufficiency and some pulmonary issues, she is doing well.  No active angina. Lipids have actually gotten much better controlled as well on current dose of statin plus Zetia.  She never did start taking Nexletol.  Plan: On stable dose atorvastatin, however with some fatigue we will try to do a short statin holiday and reduce dose-May need to consider adding Nexletol. Not on beta-blocker because of fatigue and baseline heart rate. Actually not on any BP medications at all.  BP has been stable.  Low threshold to consider ARB if pressures do increase. After completing 1 year of uninterrupted DAPT with aspirin and Brilinta, we will discontinue aspirin and reduce Brilinta to 60 mg twice daily after completion of current prescription. Okay to hold Brilinta 5 to 7 days preop for surgeries or procedures.

## 2022-05-05 NOTE — Assessment & Plan Note (Signed)
She never did switch over to rosuvastatin, stable on atorvastatin 40 mg daily along with Zetia.  Never started Nexletol.  Most recent lipids showed an LDL of 60 with mildly elevated triglycerides at 199 but otherwise lipids look fine.  With her having some fatigue and myalgias I wanted to see how she does with reduced dose of statin, if she tolerates that well, would then want to switch her to rosuvastatin plus or minus add Nexletol to maintain.  Plan: Statin holiday for 2 weeks, then reduce dose to 20 mg atorvastatin and reassess symptoms. => Will need recheck labs prior to 13-month follow-up.  If not at goal at that time, would then switch to rosuvastatin, and/or add Nexletol

## 2022-06-11 ENCOUNTER — Ambulatory Visit (INDEPENDENT_AMBULATORY_CARE_PROVIDER_SITE_OTHER): Payer: Medicare Other | Admitting: Internal Medicine

## 2022-06-11 ENCOUNTER — Encounter (HOSPITAL_BASED_OUTPATIENT_CLINIC_OR_DEPARTMENT_OTHER): Payer: Self-pay | Admitting: Internal Medicine

## 2022-06-11 VITALS — BP 129/81 | HR 80 | Ht 62.0 in | Wt 184.6 lb

## 2022-06-11 DIAGNOSIS — E785 Hyperlipidemia, unspecified: Secondary | ICD-10-CM | POA: Diagnosis not present

## 2022-06-11 DIAGNOSIS — I252 Old myocardial infarction: Secondary | ICD-10-CM | POA: Diagnosis not present

## 2022-06-11 DIAGNOSIS — T466X5A Adverse effect of antihyperlipidemic and antiarteriosclerotic drugs, initial encounter: Secondary | ICD-10-CM

## 2022-06-11 DIAGNOSIS — I25119 Atherosclerotic heart disease of native coronary artery with unspecified angina pectoris: Secondary | ICD-10-CM

## 2022-06-11 DIAGNOSIS — M791 Myalgia, unspecified site: Secondary | ICD-10-CM | POA: Diagnosis not present

## 2022-06-11 NOTE — Progress Notes (Signed)
LIPID CLINIC CONSULT NOTE  Chief Complaint:  Manage dyslipidemia  Primary Care Physician: Beola Cord, [place]  Primary Cardiologist:  Glenetta Hew, MD  HPI:  Monica Harrison is a 72 y.o. female who is being seen today for the evaluation of dyslipidemia at the request of Glenetta Hew, MD.  This is a pleasant 72 year old female kindly referred for evaluation and management of dyslipidemia.  Unfortunately she suffered a recent inferolateral STEMI while at church.  She was found to have severe single-vessel disease with 100% occlusion of the proximal RCA which was successfully stented by Dr. Ellyn Hack in December 2022.  She has done well since then.  Echo showed preserved LV function.  She was initially on high-dose atorvastatin but had significant pain with this and her dose has then been decreased to 40 mg and then recently 20 mg daily.  She had repeat lipids in January 2024 which showed total cholesterol 138, triglycerides 199, HDL 36 and LDL of 62.  She tells me today, however that she has not been taking the statin as regularly because she has had significant muscle pains associated with it.  Dr. Ellyn Hack had advised a 2-week statin holiday and she says she has felt much better off of the statin medication.  She had previously had symptoms like this associated with rosuvastatin.  She tells me that she is not interested in injectable therapies ideally because she has had reactions with injections in the past.  I would consider her statin intolerant based on the severity and recurrence of symptoms on 2 different statins in the past.  PMHx:  Past Medical History:  Diagnosis Date   Adrenal insufficiency (HCC)    Bronchitis, mucopurulent recurrent (HCC)    CAD (coronary artery disease), native coronary artery 04/01/2021   a. s/p Inferior STEMI in 03/2021: 100% p-mRCA (DES PCI); Residual 10% prox LCx stenosis and 20% mid LCx stenosis   Hyperlipidemia    MRSA pneumonia ([place]) 09/2015    Associated with a E. coli pneumonia-diagnosed by bronchoscopy.   OSA (obstructive sleep apnea) 2015   Sleep study showed mild obstructive events only when lying supine => not on CPAP.   Pneumonia due to COVID-19 virus 11/2018   Hospitalized August 2022 for COVID-19-required lengthy treatment with IV steroids along with remdesivir and baricitinib.  She eventually discharged home on oral prednisone taper and nightly 2 L [place] oxygen. 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  One 3.1-second pause at 8 AM.  Sinus bradycardia with junctional escape beats down to 28 bpm noted between 630 and 7:30 AM.  Junctional rhythm bradycardia noted at 2 AM.  Occasional PACs (2.2%) with 2 short atrial runs fastest and longest was 7 beats at 128 bpm. = Bradycardia & Pauses reviewed & cleared by EP - avoid AVN agents   CORONARY/GRAFT ACUTE MI REVASCULARIZATION N/A 04/01/2021   Procedure: Coronary/Graft Acute MI Revascularization;  Surgeon: Leonie Man, MD;  Location: [facility];  Service: Cardiovascular;  Successful DES PCI of the proximal RCA using 2.75 mm x 30 mm DES  stent postdilated to 3.1 mm.   LEFT HEART CATH AND CORONARY ANGIOGRAPHY N/A 04/01/2021   Procedure: LEFT HEART CATH AND CORONARY ANGIOGRAPHY;  Surgeon: Leonie Man, MD;  Location: [facility];  Service: Cardiovascular; Severe single-vessel CAD with 100% thrombotic occlusion of the prox RCA (-> DES PCI); Preserved LVEF; Borderline hypotension requiring Levophed infusion.   TRANSTHORACIC ECHOCARDIOGRAM  04/02/2021   (Immediately post Inferior  STEMI) hyperdynamic LV, EF 65 to 70%.  No RWMA.  Moderate basal septal hypertrophy.  Indeterminate diastolic pressures (grade 1 diastolic function).  Mildly reduced RV function but unable assess PAP.  RAP estimated at 15 mmHg with mild right atrial dilation.  Normal mitral and aortic valves.    FAMHx:  Family History  Problem Relation Age of Onset   Lung disease Neg Hx    Heart disease Neg Hx     SOCHx:   reports that she has never smoked. She has never used smokeless tobacco. She reports that she does not drink alcohol and does not use drugs.  ALLERGIES:  Allergies  Allergen Reactions   Avelox [Moxifloxacin Hcl In Nacl] Anaphylaxis   Biaxin [Clarithromycin] Anaphylaxis   Lipitor [Atorvastatin]     myalgias   Lactose Intolerance (Gi) Other (See Comments)    Bloating, gas, congestion   Tobramycin Other (See Comments)    Severe headaches    Yeast-Related Products Swelling    Tongue swelling /candida    ROS: Pertinent items noted in HPI and remainder of comprehensive ROS otherwise negative.  HOME MEDS: Current Outpatient Medications on File Prior to Visit  Medication Sig Dispense Refill   acetaminophen (TYLENOL) 325 MG tablet Take 650 mg by mouth every 6 (six) hours as needed.     albuterol (VENTOLIN HFA) 108 (90 Base) MCG/ACT inhaler Inhale 2 puffs into the lungs every 6 (six) hours as needed for wheezing or shortness of breath. 8 g 5   ALPRAZolam (XANAX) 0.25 MG tablet Take 0.25 mg by mouth at bedtime.     Ascorbic Acid (VITAMIN C) 1000 MG tablet Take 2,000 mg by mouth 2 (two) times daily.     atorvastatin (LIPITOR) 20 MG tablet Take 20 mg by mouth daily.     B Complex Vitamins (B COMPLEX 50 PO) Take 1 tablet by mouth daily.      BRILINTA 60 MG TABS tablet Take 1 tablet (60 mg total) by mouth 2 (two) times daily. 180 tablet 3   Calcium-Magnesium-Zinc (CAL-MAG-ZINC PO) Take 2 tablets by mouth at bedtime.     Cholecalciferol (VITAMIN D3) 5000 units CAPS Take 5,000 Units by  mouth at bedtime.     Coenzyme Q10 (CO Q 10) 100 MG CAPS Take 100 mg by mouth every evening.      diclofenac Sodium (VOLTAREN) 1 % GEL Apply 1 application. topically 4 (four) times daily.     Digestive Enzymes (ENZYME DIGEST) CAPS Take 1 capsule by mouth 2 (two) times daily with a meal.      hydrocortisone (CORTEF) 5 MG tablet Take 5 mg by mouth See admin instructions. TAKE '10MG'$  IN THE MORNING AND 5 MG IN THE AFTERNOON.     Magnesium 400 MG TABS Take 400 mg by mouth at bedtime.     Multiple Vitamin (MULTIVITAMIN WITH MINERALS) TABS tablet Take 3 tablets by mouth daily. Pure-[place]     nitroGLYCERIN (NITROSTAT) 0.4 MG SL tablet Place 1 tablet (0.4 mg total) under the tongue every 5 (five) minutes as needed. 25 tablet 2  nystatin cream (MYCOSTATIN) Apply 1 application. topically 2 (two) times daily as needed for rash.     Omega-3 Fatty Acids (OMEGA 3 PO) Take 2 capsules by mouth daily.     Polyethyl Glycol-Propyl Glycol 0.4-0.3 % SOLN Place 1 drop into both eyes 4 (four) times daily.      polyethylene glycol (MIRALAX / GLYCOLAX) 17 g packet Take 17 g by mouth daily as needed for mild constipation.     Probiotic Product (PROBIOTIC PO) Take 1 capsule by mouth every evening.      thyroid (ARMOUR THYROID) 90 MG tablet Take 45 mg by mouth in the morning.     thyroid (ARMOUR) 30 MG tablet Take 30 mg by mouth every evening.     vitamin A 10000 UNIT capsule Take 10,000 Units by mouth every evening.      No current facility-administered medications on file prior to visit.    LABS/IMAGING: No results found for this or any previous visit (from the past 48 hour(s)). No results found.  LIPID PANEL:    Component Value Date/Time   CHOL 138 04/17/2022 1053   CHOL 167 12/10/2021 1132   TRIG 199 (H) 04/17/2022 1053   HDL 36 (L) 04/17/2022 1053   HDL 45 12/10/2021 1132   CHOLHDL 3.8 04/17/2022 1053   VLDL 40 04/17/2022 1053   LDLCALC 62 04/17/2022 1053   LDLCALC 98 12/10/2021 1132    WEIGHTS: Wt  Readings from Last 3 Encounters:  06/11/22 184 lb 9.6 oz (83.7 kg)  04/22/22 187 lb 3.2 oz (84.9 kg)  02/25/22 187 lb 6.4 oz (85 kg)    VITALS: BP 129/81 (BP Location: Left Arm, Patient Position: Sitting, Cuff Size: Large)   Pulse 80   Ht '5\' 2"'$  (1.575 m)   Wt 184 lb 9.6 oz (83.7 kg)   SpO2 95%   BMI 33.76 kg/m   EXAM: Deferred  EKG: Deferred  ASSESSMENT: Mixed dyslipidemia, goal LDL less than 70 Statin intolerant-myalgias (Crestor, Lipitor) Coronary artery disease with prior inferolateral MI and occluded RCA status post PCI (03/2021)  PLAN: 1.   Ms. Koffman has a mixed dyslipidemia and although she is at target LDL as of a month ago, she is not tolerating her statin.  She reports even 1 or 2 doses has caused her worsening symptoms but she felt much better when she was off of the medication for holiday.  That being said I expect her cholesterol to go up further above target off of atorvastatin and she will need additional therapies.  She initially mentioned she would not consider any injectable therapies however her insurance is ideal as they have supplemental part F coverage with Medicare that would cover Leqvio.  I think this would be a good option for her due to a very low rate of side effects and at least 50% reduction in cholesterol which is expected.  The alternative would be Nexlizet which would be a pill option although I expected to be more expensive and it could have other side effects which might not be tolerated.  She wants to weigh these options and discuss it with her integrative medicine physician Dr. Wynona Meals.  I would advise we also assess an LP(a) because I believe this was not checked.  Certainly if this is elevated I would favor Leqvio as this product is one of the few options that could lower LP(a) as well, since it is not affected by the statins, or other oral therapies, diet, weight loss or physical activity.  Thanks again for the kind referral.  Follow-up will be  based on the therapy that she elects.  Pixie Casino, MD, [facility], [place] Director of the Advanced Lipid Disorders &  Cardiovascular Risk Reduction Clinic Diplomate of the American Board of Clinical Lipidology Attending Cardiologist  Direct Dial: [phone]  Fax: [phone]  Website:  [email or web address] 06/11/2022, 4:51 PM

## 2022-06-11 NOTE — Patient Instructions (Signed)
Medication Instructions:  Consider the following cholesterol medications: - Nexlizet - Leqvio  *If you need a refill on your cardiac medications before your next appointment, please call your pharmacy*   Lab Work: FASTING lab work in about 6 months -- before next appointment   If you have labs (blood work) drawn today and your tests are completely normal, you will receive your results only by: [place] (if you have MyChart) OR A paper copy in the mail If you have any lab test that is abnormal or we need to change your treatment, we will call you to review the results.   Follow-Up: At [facility], you and your health needs are our priority.  As part of our continuing mission to provide you with exceptional heart care, we have created designated Provider Care Teams.  These Care Teams include your primary Cardiologist (physician) and Advanced Practice Providers (APPs -  Physician Assistants and Nurse Practitioners) who all work together to provide you with the care you need, when you need it.  We recommend signing up for the patient portal called "MyChart".  Sign up information is provided on this After Visit Summary.  MyChart is used to connect with patients for Virtual Visits (Telemedicine).  Patients are able to view lab/test results, encounter notes, upcoming appointments, etc.  Non-urgent messages can be sent to your provider as well.   To learn more about what you can do with MyChart, go to [email or web address].    Your next appointment:    6 months with Dr. Debara Pickett

## 2022-06-12 LAB — LIPOPROTEIN A (LPA): Lipoprotein (a): 20.1 nmol/L (ref <75.0)

## 2022-07-02 ENCOUNTER — Telehealth: Payer: Self-pay | Admitting: Cardiology

## 2022-07-02 NOTE — Telephone Encounter (Signed)
Reieved call from [employer], they need Dr. Allison Quarry signature on physical therapy paperwork. Scanned in Media tab on 05/21/22.

## 2022-07-02 NOTE — Telephone Encounter (Signed)
Spoke with staff letting them know provider is out of office today and nurse aware signature needed.  Will get this completed tomorrow.

## 2022-07-04 ENCOUNTER — Telehealth: Payer: Self-pay | Admitting: Cardiology

## 2022-07-04 DIAGNOSIS — E785 Hyperlipidemia, unspecified: Secondary | ICD-10-CM

## 2022-07-04 DIAGNOSIS — I25119 Atherosclerotic heart disease of native coronary artery with unspecified angina pectoris: Secondary | ICD-10-CM

## 2022-07-04 NOTE — Telephone Encounter (Signed)
Pt states that her in PCP in [place] is inquiring if Dr. Ellyn Hack thinks the pt needs another echo, since her heart attack in Dec 22'. Pt said that her PCP told her, that it should be time for it, since they usually do them 1 year to year and half later. Please advise.

## 2022-07-05 NOTE — Telephone Encounter (Signed)
Late entry  -  signed form was faxed 07/03/22

## 2022-07-05 NOTE — Telephone Encounter (Signed)
Had echo at at time of her MI - Hyperdynamic with normal function.  We do not routinely follow Echo's unless there is a change in symptoms or if we are looking to see if there was recovery of function after interventions.  [place]

## 2022-07-08 NOTE — Telephone Encounter (Signed)
Unable to leave a voice mail.

## 2022-07-12 ENCOUNTER — Other Ambulatory Visit: Payer: Self-pay | Admitting: *Deleted

## 2022-07-12 DIAGNOSIS — E785 Hyperlipidemia, unspecified: Secondary | ICD-10-CM

## 2022-07-12 DIAGNOSIS — I25119 Atherosclerotic heart disease of native coronary artery with unspecified angina pectoris: Secondary | ICD-10-CM

## 2022-07-12 NOTE — Telephone Encounter (Signed)
Spoke to  patient. She is aware of   Dr Ellyn Hack comment -- she does not need an Echo at present time.   Patient states she told her primary  ,but she did what they asked by calling the office.  Patient request an appointment as a recall suggested for July  2024.  Appointment given 10/23/22 at 9:30.   Lab slip  mailed --  for lipid , CMP  week prior to office visit

## 2022-07-12 NOTE — Addendum Note (Signed)
Addended by: Raiford Simmonds on: 07/12/2022 03:04 PM   Modules accepted: Orders

## 2022-08-05 ENCOUNTER — Telehealth: Payer: Self-pay | Admitting: Cardiology

## 2022-08-05 NOTE — Telephone Encounter (Signed)
Faxed ATTNJasmine Harrison to fax number [phone], . Sent a few minutes prior to this call.

## 2022-08-05 NOTE — Telephone Encounter (Signed)
Monica Harrison, from [facility] called to say to refaxed two re-evaluations of plan of care that need to faxed back to them today, one dated 06/13/22 and 07/08/22.

## 2022-08-07 NOTE — Telephone Encounter (Signed)
Faxed   information requested on 08/06/22 received confirmation  faxed successful

## 2022-09-08 ENCOUNTER — Emergency Department (HOSPITAL_COMMUNITY)
Admission: EM | Admit: 2022-09-08 | Discharge: 2022-09-08 | Disposition: A | Discharge status: Home / Self Care | Payer: Medicare Other | Attending: Emergency Medicine | Admitting: Emergency Medicine

## 2022-09-08 ENCOUNTER — Emergency Department (HOSPITAL_COMMUNITY): Payer: Medicare Other

## 2022-09-08 ENCOUNTER — Other Ambulatory Visit: Payer: Self-pay

## 2022-09-08 ENCOUNTER — Encounter (HOSPITAL_COMMUNITY): Payer: Self-pay | Admitting: *Deleted

## 2022-09-08 DIAGNOSIS — R531 Weakness: Secondary | ICD-10-CM | POA: Insufficient documentation

## 2022-09-08 DIAGNOSIS — Z79899 Other long term (current) drug therapy: Secondary | ICD-10-CM | POA: Diagnosis not present

## 2022-09-08 DIAGNOSIS — I160 Hypertensive urgency: Secondary | ICD-10-CM | POA: Insufficient documentation

## 2022-09-08 DIAGNOSIS — I10 Essential (primary) hypertension: Secondary | ICD-10-CM | POA: Diagnosis present

## 2022-09-08 LAB — URINALYSIS, ROUTINE W REFLEX MICROSCOPIC
Bilirubin Urine: NEGATIVE
Glucose, UA: NEGATIVE mg/dL
Hgb urine dipstick: NEGATIVE
Ketones, ur: NEGATIVE mg/dL
Leukocytes,Ua: NEGATIVE
Nitrite: NEGATIVE
Protein, ur: NEGATIVE mg/dL
Specific Gravity, Urine: 1.01 (ref 1.005–1.030)
pH: 5 (ref 5.0–8.0)

## 2022-09-08 LAB — COMPREHENSIVE METABOLIC PANEL
ALT: 51 U/L — ABNORMAL HIGH (ref 0–44)
AST: 47 U/L — ABNORMAL HIGH (ref 15–41)
Albumin: 4 g/dL (ref 3.5–5.0)
Alkaline Phosphatase: 61 U/L (ref 38–126)
Anion gap: 12 (ref 5–15)
BUN: 18 mg/dL (ref 8–23)
CO2: 18 mmol/L — ABNORMAL LOW (ref 22–32)
Calcium: 9.6 mg/dL (ref 8.9–10.3)
Chloride: 105 mmol/L (ref 98–111)
Creatinine, Ser: 0.76 mg/dL (ref 0.44–1.00)
GFR, Estimated: >60 mL/min (ref >60)
Glucose, Bld: 154 mg/dL — ABNORMAL HIGH (ref 70–99)
Potassium: 3.8 mmol/L (ref 3.5–5.1)
Sodium: 135 mmol/L (ref 135–145)
Total Bilirubin: 0.5 mg/dL (ref 0.3–1.2)
Total Protein: 7.7 g/dL (ref 6.5–8.1)

## 2022-09-08 LAB — CBC WITH DIFFERENTIAL/PLATELET
Abs Immature Granulocytes: 0.03 10*3/uL (ref 0.00–0.07)
Basophils Absolute: 0.1 10*3/uL (ref 0.0–0.1)
Basophils Relative: 1 %
Eosinophils Absolute: 0.1 10*3/uL (ref 0.0–0.5)
Eosinophils Relative: 1 %
HCT: 40 % (ref 36.0–46.0)
Hemoglobin: 13.5 g/dL (ref 12.0–15.0)
Immature Granulocytes: 0 %
Lymphocytes Relative: 24 %
Lymphs Abs: 2.2 10*3/uL (ref 0.7–4.0)
MCH: 30.9 pg (ref 26.0–34.0)
MCHC: 33.8 g/dL (ref 30.0–36.0)
MCV: 91.5 fL (ref 80.0–100.0)
Monocytes Absolute: 0.9 10*3/uL (ref 0.1–1.0)
Monocytes Relative: 10 %
Neutro Abs: 5.8 10*3/uL (ref 1.7–7.7)
Neutrophils Relative %: 64 %
Platelets: 248 10*3/uL (ref 150–400)
RBC: 4.37 MIL/uL (ref 3.87–5.11)
RDW: 12.6 % (ref 11.5–15.5)
WBC: 9 10*3/uL (ref 4.0–10.5)
nRBC: 0 % (ref 0.0–0.2)

## 2022-09-08 LAB — TROPONIN I (HIGH SENSITIVITY)
Troponin I (High Sensitivity): 4 ng/L (ref <18)
Troponin I (High Sensitivity): 4 ng/L (ref <18)

## 2022-09-08 MED ORDER — SODIUM CHLORIDE 0.9 % IV BOLUS
500.0000 mL | Freq: Once | INTRAVENOUS | Status: AC
  Administered 2022-09-08: 500 mL via INTRAVENOUS

## 2022-09-08 NOTE — Discharge Instructions (Signed)
You are seen in the emergency department for feeling weak and sweaty, elevated blood pressure.  Your blood pressure normalized while you were here.  You had lab work chest x-ray urinalysis that did not show a definite cause of your symptoms.  This will need to be followed up with your primary care doctor.  Please continue regular medications.  Return to the emergency department if any worsening or concerning symptoms.

## 2022-09-08 NOTE — ED Provider Notes (Signed)
[place] EMERGENCY DEPARTMENT AT [facility] Provider Note   CSN: [id number] Arrival date & time: 09/08/22  1009     History  Chief Complaint  Patient presents with   Hypertension    Monica Harrison is a 72 y.o. female.  She has a history of cardiac disease adrenal insufficiency on chronic steroids.  She said she did not feel well yesterday but it was fairly nonspecific.  Today she noticed she was sweating and did not feel well but she forced herself to go to church.  Church she continues to not feel well and that her blood pressure and found to be 200.  She has been troubled with chronic left shoulder pain and follows with an orthopedist and a physical therapist.  That is been giving her more trouble recently.  She denies any headache blurry vision double vision chest pain shortness of breath nausea vomiting diarrhea or urinary symptoms.  No numbness or weakness.  Her symptoms are improving here on arrival.  Blood pressure also better.  The history is provided by the patient.  Hypertension This is a recurrent problem. The current episode started 1 to 2 hours ago. The problem occurs rarely. The problem has been gradually improving. Pertinent negatives include no chest pain, no abdominal pain, no headaches and no shortness of breath. Nothing aggravates the symptoms. Nothing relieves the symptoms. She has tried rest for the symptoms. The treatment provided moderate relief.       Home Medications Prior to Admission medications   Medication Sig Start Date End Date Taking? Authorizing Provider  acetaminophen (TYLENOL) 325 MG tablet Take 650 mg by mouth every 6 (six) hours as needed.    [provider]  albuterol (VENTOLIN HFA) 108 (90 Base) MCG/ACT inhaler Inhale 2 puffs into the lungs every 6 (six) hours as needed for wheezing or shortness of breath. 05/28/21   Mannam, Colbert Coyer, MD  ALPRAZolam (XANAX) 0.25 MG tablet Take 0.25 mg by mouth at bedtime.    [provider]  Ascorbic Acid (VITAMIN C) 1000 MG tablet Take 2,000 mg by mouth 2 (two) times daily.    [provider]  atorvastatin (LIPITOR) 20 MG tablet Take 20 mg by mouth daily.    [provider]  B Complex Vitamins (B COMPLEX 50 PO) Take 1 tablet by mouth daily.     [provider]  BRILINTA 60 MG TABS tablet Take 1 tablet (60 mg total) by mouth 2 (two) times daily. 04/24/22   Marykay Lex, MD  Calcium-Magnesium-Zinc (CAL-MAG-ZINC PO) Take 2 tablets by mouth at bedtime.    [provider]  Cholecalciferol (VITAMIN D3) 5000 units CAPS Take 5,000 Units by mouth at bedtime.    [provider]  Coenzyme Q10 (CO Q 10) 100 MG CAPS Take 100 mg by mouth every evening.     [provider]  diclofenac Sodium (VOLTAREN) 1 % GEL Apply 1 application. topically 4 (four) times daily. 09/07/21   [provider]  Digestive Enzymes (ENZYME DIGEST) CAPS Take 1 capsule by mouth 2 (two) times daily with a meal.     [provider]  hydrocortisone (CORTEF) 5 MG tablet Take 5 mg by mouth See admin instructions. TAKE 10MG  IN THE MORNING AND 5 MG IN THE AFTERNOON.    [provider]  Magnesium 400 MG TABS Take 400 mg by mouth at bedtime.    [provider]  Multiple Vitamin (MULTIVITAMIN WITH MINERALS) TABS tablet Take 3 tablets  by mouth daily. Pure-[place]    [provider]  nitroGLYCERIN (NITROSTAT) 0.4 MG SL tablet Place 1 tablet (0.4 mg total) under the tongue every 5 (five) minutes as needed. 04/03/21   Arty Baumgartner, NP  nystatin cream (MYCOSTATIN) Apply 1 application. topically 2 (two) times daily as needed for rash. 03/06/21   [provider]  Omega-3 Fatty Acids (OMEGA 3 PO) Take 2 capsules by mouth daily.    [provider]  Polyethyl Glycol-Propyl Glycol 0.4-0.3 % SOLN Place 1 drop into both eyes 4 (four) times daily.     [provider]  polyethylene glycol (MIRALAX / GLYCOLAX)  17 g packet Take 17 g by mouth daily as needed for mild constipation.    [provider]  Probiotic Product (PROBIOTIC PO) Take 1 capsule by mouth every evening.     [provider]  thyroid (ARMOUR THYROID) 90 MG tablet Take 45 mg by mouth in the morning.    [provider]  thyroid (ARMOUR) 30 MG tablet Take 30 mg by mouth every evening.    [provider]  vitamin A 16109 UNIT capsule Take 10,000 Units by mouth every evening.     [provider]      Allergies    Avelox [moxifloxacin hcl in nacl], Biaxin [clarithromycin], Lipitor [atorvastatin], Lactose intolerance (gi), Tobramycin, and Yeast-related products    Review of Systems   Review of Systems  Constitutional:  Positive for diaphoresis and fatigue. Negative for fever.  Eyes:  Negative for visual disturbance.  Respiratory:  Negative for shortness of breath.   Cardiovascular:  Negative for chest pain.  Gastrointestinal:  Negative for abdominal pain.  Genitourinary:  Negative for dysuria.  Neurological:  Negative for headaches.    Physical Exam Updated Vital Signs BP (!) 145/78 (BP Location: Left Arm)   Pulse 65   Temp (!) 97.4 F (36.3 C) (Oral)   Resp 13   Ht 5\' 3"  (1.6 m)   Wt 82.6 kg   SpO2 95%   BMI 32.24 kg/m  Physical Exam Vitals and nursing note reviewed.  Constitutional:      General: She is not in acute distress.    Appearance: Normal appearance. She is well-developed.  HENT:     Head: Normocephalic and atraumatic.  Eyes:     Conjunctiva/sclera: Conjunctivae normal.  Cardiovascular:     Rate and Rhythm: Normal rate and regular rhythm.     Heart sounds: No murmur heard. Pulmonary:     Effort: Pulmonary effort is normal. No respiratory distress.     Breath sounds: Normal breath sounds.  Abdominal:     Palpations: Abdomen is soft.     Tenderness: There is no abdominal tenderness. There is no guarding or rebound.  Musculoskeletal:     Cervical back: Neck  supple.     Right lower leg: No edema.     Left lower leg: No edema.  Skin:    General: Skin is warm and dry.     Capillary Refill: Capillary refill takes less than 2 seconds.  Neurological:     General: No focal deficit present.     Mental Status: She is alert and oriented to person, place, and time.     Cranial Nerves: No cranial nerve deficit.     Sensory: No sensory deficit.     Motor: No weakness.     ED Results / Procedures / Treatments   Labs (all labs ordered are listed, but only abnormal results  are displayed) Labs Reviewed  COMPREHENSIVE METABOLIC PANEL - Abnormal; Notable for the following components:      Result Value   CO2 18 (*)    Glucose, Bld 154 (*)    AST 47 (*)    ALT 51 (*)    All other components within normal limits  URINALYSIS, ROUTINE W REFLEX MICROSCOPIC - Abnormal; Notable for the following components:   APPearance HAZY (*)    All other components within normal limits  CBC WITH DIFFERENTIAL/PLATELET  TROPONIN I (HIGH SENSITIVITY)  TROPONIN I (HIGH SENSITIVITY)    EKG EKG Interpretation  Date/Time:  Sunday Sep 08 2022 10:33:13 EDT Ventricular Rate:  58 PR Interval:  153 QRS Duration: 135 QT Interval:  479 QTC Calculation: 471 R Axis:   88 Text Interpretation: Sinus rhythm Right bundle branch block No significant change since prior 12/22 Confirmed by Meridee Score [phone]) on 09/08/2022 10:45:07 AM  Radiology DG Chest Port 1 View  Result Date: 09/08/2022 CLINICAL DATA:  Weakness. Not feeling good. Hot and sweaty while it church with blood pressure of 200/100. EXAM: PORTABLE CHEST 1 VIEW COMPARISON:  07/24/2021 FINDINGS: Stable cardiomediastinal contours. Aortic tortuosity. No pleural fluid, interstitial edema or airspace disease. Unchanged chronic interstitial coarsening. Visualized osseous structures appear intact. IMPRESSION: 1. No acute findings. 2. Chronic interstitial coarsening.  The Electronically Signed   By: Signa Kell M.D.   On:  09/08/2022 11:13    Procedures Procedures    Medications Ordered in ED Medications  sodium chloride 0.9 % bolus 500 mL (has no administration in time range)    ED Course/ Medical Decision Making/ A&P Clinical Course as of 09/08/22 1847  Sun Sep 08, 2022  1116 Dust x-ray interpreted by me as no acute infiltrates.  Awaiting radiology reading. [MB]    Clinical Course User Index [MB] Terrilee Files, MD                             Medical Decision Making Amount and/or Complexity of Data Reviewed Labs: ordered. Radiology: ordered.   This patient complains of elevated blood pressure diaphoresis malaise; this involves an extensive number of treatment Options and is a complaint that carries with it a high risk of complications and morbidity. The differential includes hypertensive urgency, stroke, bleed, ACS, dehydration, metabolic derangement, renal failure  I ordered, reviewed and interpreted labs, which included CBC normal chemistries with low bicarb elevated glucose seen before, LFTs mildly elevated seen before, urinalysis without signs of infection, troponins flat I ordered medication IV fluids and reviewed PMP when indicated. I ordered imaging studies which included chest x-ray and I independently    visualized and interpreted imaging which showed no acute findings Additional history obtained from patient's husband Previous records obtained and reviewed in epic including recent cardiology notes Cardiac monitoring reviewed, normal sinus rhythm Social determinants considered, no significant barriers Critical Interventions: None  After the interventions stated above, I reevaluated the patient and found patient to be asymptomatic and back to baseline Admission and further testing considered, no indications for admission warranted at this time.  Recommended continuing medications and closely following up with PCP.  Also recommended checking her blood pressure at home and recording  it so she can follow-up with her PCP and cardiology.  Return instructions discussed.         Final Clinical Impression(s) / ED Diagnoses Final diagnoses:  Hypertensive urgency  General weakness    Rx / DC Orders  ED Discharge Orders     None         Terrilee Files, MD 09/08/22 [phone]

## 2022-09-08 NOTE — ED Triage Notes (Signed)
Pt states she started to "not feel good" yesterday; pt states she felt hot and sweaty while at church and her pressure 200/100  Pt states before she left for church this am her BP was 124/76

## 2022-09-08 NOTE — ED Notes (Signed)
Pt informed of need for urine specimen  

## 2022-10-16 ENCOUNTER — Telehealth: Payer: Self-pay | Admitting: Pulmonary Disease

## 2022-10-16 NOTE — Telephone Encounter (Signed)
Spoke with patient regarding prior message per Florentina Addison  If her symptoms just started yesterday, would recommend she continue with Robitussin or mucinex for the congestion. She can use albuterol as needed for SOB/wheezing/chest tightness. Possibly viral in nature, which can take 7-10 days to run it's course. Not appropriate for abx at this point given timeframe and lack of infectious symptoms. If she develops new fevers, chills, or worsening symptoms, she should call back to schedule acute visit or go to UC/ED. Thanks.   Advised patient our office will be open on Friday is patient is still feeling bad.   Patient's voice was understanding.Nothing else further needed

## 2022-10-16 NOTE — Telephone Encounter (Signed)
Patient states having symptoms of chest congestion and cough. Pharmacy is [facility]. Patient phone number is [phone].

## 2022-10-16 NOTE — Telephone Encounter (Signed)
ATC x1 LVM for patient to call our office back. 

## 2022-10-16 NOTE — Telephone Encounter (Signed)
If her symptoms just started yesterday, would recommend she continue with Robitussin or mucinex for the congestion. She can use albuterol as needed for SOB/wheezing/chest tightness. Possibly viral in nature, which can take 7-10 days to run it's course. Not appropriate for abx at this point given timeframe and lack of infectious symptoms. If she develops new fevers, chills, or worsening symptoms, she should call back to schedule acute visit or go to UC/ED. Thanks.

## 2022-10-16 NOTE — Telephone Encounter (Signed)
Patient called to speak with the nurse regarding some test results that were negative.  Please call to discuss at [phone]

## 2022-10-16 NOTE — Telephone Encounter (Signed)
Spoke with patient regarding prior message.  Patient started she started having chest congestion yesterday afternoon. No fever and slight cough. Patient started taking robitussin last night.Patient stated she doesn't want this to turn into Pneumonia.Patient is going to have a test done at her PCP but no office visit. Patient stated she would like a script for Augmentin that helped in the past sent into her pharmacy of choice.   Monica Harrison can you please advise.

## 2022-10-18 NOTE — Telephone Encounter (Signed)
Spoke with patienT. States she received Augmentin from pcp and is doing much better. NFN at this time

## 2022-10-19 LAB — COMPREHENSIVE METABOLIC PANEL
ALT: 42 IU/L — ABNORMAL HIGH (ref 0–32)
AST: 36 IU/L (ref 0–40)
Albumin: 4.4 g/dL (ref 3.8–4.8)
Alkaline Phosphatase: 76 IU/L (ref 44–121)
BUN/Creatinine Ratio: 19 (ref 12–28)
BUN: 15 mg/dL (ref 8–27)
Bilirubin Total: 0.3 mg/dL (ref 0.0–1.2)
CO2: 22 mmol/L (ref 20–29)
Calcium: 10.3 mg/dL (ref 8.7–10.3)
Chloride: 104 mmol/L (ref 96–106)
Creatinine, Ser: 0.8 mg/dL (ref 0.57–1.00)
Globulin, Total: 2.9 g/dL (ref 1.5–4.5)
Glucose: 131 mg/dL — ABNORMAL HIGH (ref 70–99)
Potassium: 4.4 mmol/L (ref 3.5–5.2)
Sodium: 139 mmol/L (ref 134–144)
Total Protein: 7.3 g/dL (ref 6.0–8.5)
eGFR: 78 mL/min/{1.73_m2} (ref >59)

## 2022-10-19 LAB — LIPID PANEL
Chol/HDL Ratio: 3.9 ratio (ref 0.0–4.4)
Cholesterol, Total: 151 mg/dL (ref 100–199)
HDL: 39 mg/dL — ABNORMAL LOW (ref >39)
LDL Chol Calc (NIH): 79 mg/dL (ref 0–99)
Triglycerides: 197 mg/dL — ABNORMAL HIGH (ref 0–149)
VLDL Cholesterol Cal: 33 mg/dL (ref 5–40)

## 2022-10-22 ENCOUNTER — Telehealth: Payer: Self-pay | Admitting: Pulmonary Disease

## 2022-10-22 NOTE — Telephone Encounter (Signed)
See signed encounters. PT states she needs to see a NP or Dr. Isaiah Serge. States someone told her we could work her in. I have nothing until the end of July. Please call to advise. She is already seeing other Dr's. But feels like a Pulm should see her because she formerly had a Covid pneumonia. She is spitting up and coughing.   Her # is [phone]

## 2022-10-22 NOTE — Telephone Encounter (Signed)
Called and spoke with patient. She stated that she called last week when her symptoms first started. She had a productive cough with clear phlegm. She not receive any abx from our office so her PCP prescribed amox 875mg  for 10 days. She had not noticed any improvement. Her cough has actually gotten worse since now she is coughing up yellowish phlegm. Denied any fevers or body aches. She has been tested for COVID, strep and flu and all tests were negative.   She has been using her albuterol 3x daily as well as taking Robtussin DM at night to help with the cough. She has also been using her [employer]. Her ortho had given her prednisone 5mg  to help with tendinitis a few weeks ago and took one tablet this morning. She was concerned about taking the prednisone while also on hydrocortisone 5mg .   She wanted an appt with either Dr. Isaiah Serge or an APP. The only appt I was able to find was for Thursday at 4pm. She lives in [place] and already has an appt tomorrow in [place] with her cardiologist. She wanted to know if there was any way she could be added to Dr. Shirlee More schedule for tomorrow.   Dr. Isaiah Serge, can you please advise? Thanks!

## 2022-10-23 ENCOUNTER — Ambulatory Visit: Payer: Medicare Other | Attending: Cardiology | Admitting: Cardiology

## 2022-10-23 ENCOUNTER — Encounter: Payer: Self-pay | Admitting: Cardiology

## 2022-10-23 VITALS — BP 122/70 | HR 85 | Ht 63.0 in | Wt 181.2 lb

## 2022-10-23 DIAGNOSIS — I2510 Atherosclerotic heart disease of native coronary artery without angina pectoris: Secondary | ICD-10-CM | POA: Insufficient documentation

## 2022-10-23 DIAGNOSIS — F419 Anxiety disorder, unspecified: Secondary | ICD-10-CM | POA: Insufficient documentation

## 2022-10-23 DIAGNOSIS — Z955 Presence of coronary angioplasty implant and graft: Secondary | ICD-10-CM | POA: Diagnosis present

## 2022-10-23 DIAGNOSIS — I2119 ST elevation (STEMI) myocardial infarction involving other coronary artery of inferior wall: Secondary | ICD-10-CM | POA: Diagnosis present

## 2022-10-23 DIAGNOSIS — E785 Hyperlipidemia, unspecified: Secondary | ICD-10-CM | POA: Diagnosis present

## 2022-10-23 DIAGNOSIS — R001 Bradycardia, unspecified: Secondary | ICD-10-CM | POA: Insufficient documentation

## 2022-10-23 DIAGNOSIS — E1169 Type 2 diabetes mellitus with other specified complication: Secondary | ICD-10-CM | POA: Insufficient documentation

## 2022-10-23 MED ORDER — ATORVASTATIN CALCIUM 10 MG PO TABS: ORAL_TABLET | ORAL | 3 refills | Status: DC

## 2022-10-23 MED ORDER — METOPROLOL TARTRATE 25 MG PO TABS: ORAL_TABLET | ORAL | 6 refills | Status: AC

## 2022-10-23 MED ORDER — EZETIMIBE 10 MG PO TABS: 10.0000 mg | ORAL_TABLET | Freq: Every day | ORAL | 3 refills | Status: DC

## 2022-10-23 NOTE — Assessment & Plan Note (Signed)
18 months out from STEMI with PCI.  Would like to complete 24 months of Brilinta. Plan will be to convert from maintenance dose 60 mg twice daily Brilinta to 81 mg aspirin at next follow-up visit.  Okay to hold Brilinta 5 to 7 days preop for surgeries or procedures.

## 2022-10-23 NOTE — Assessment & Plan Note (Signed)
Notable anxiety attacks with intermittent episodes of hypertension.  This tends to trigger her adrenal insufficiency.  We talked about staying adequately hydrated.  She is taking as needed Xanax.  Will add PRN metoprolol 25 mg p.o.for anxiety attacks that she will take in addition to the Xanax.

## 2022-10-23 NOTE — Progress Notes (Signed)
Cardiology Office Note:  .   Date:  10/27/2022  ID:  Monica Harrison, DOB [birth year], MRN [id number] PCP: Tacy Learn, FNP  [facility] Providers Cardiologist:  Bryan Lemma, MD     Chief Complaint  Patient presents with   Follow-up    6 months.   Coronary Artery Disease    18 months out from MI.  Doing well.  No angina.   Tachycardia    ER visit for hypertension and tachycardia.  Has spells that are likely related to anxiety.   History of Present Illness: .     Monica Harrison is a  72 y.o. female  with a PMH notable for CAD (inferior STEMI-RCA PCI), history bradycardia, borderline hypertension, HLD (statin intolerant), chronic renal insufficiency, Chronic Fatigue-Sleep Disordered Breathing, History of Adrenal Insufficiency who presents here for 40-month follow-up at the request of Tacy Learn, FNP.  04/01/2021: Inferior STEMI: 100% proximal-mid RCA-DES PCI.  EF 65 to 70%. Zio patch placed because of bradycardia, no beta-blocker. Has been [facility].  Has been doing very well [employer] [facility] - Toniann Fail) Statin intolerant-referred to lipid management clinic with Dr. Pamella Pert was last seen on April 22, 2022: Doing well.  Happy with BP levels for the most part in the 120s to 130s over 70s.  Doing once a week physical therapy but hoping to try to increase to 2 days a week.  Helping with exercise intolerance.  Able to do some walking on her own, getting winded if she just "too far "but no chest pain or pressure with rest or exertion.  No notable exertional dyspnea.  Noted some mild myalgias and fatigue but better. Put in request for additional day (now 2 days a week) therapy with "Toniann Fail "at [facility]. A1c checked Statin holiday for 2 weeks then potentially reduce to 20 mg atorvastatin and refer to lipid clinic.  Consider Nexletol/Nexlizet, vs injectable No beta-blocker due to fatigue.  In fact not on  any BP medications at this time.  Considered ARB. ---------------------------------------------------------------------   Subjective  INTERVAL HISTORY Seen by Dr. Rennis Golden on February 27 for lipid management.  Felt to be a good candidate for for potentially using Leqvio with insurance coverage.  However he recommended checking LP(a) which was checked and normal.  The other alternative would be Nexlizet.  She wanted to discuss this with her "integrative medicine physician physician " ER visit 09/08/2022 For Hypertensive Emergency: Did not feel well.  Noted to have BP over 200.  Noted that her shoulder is giving her more pain.  That headache with blurred vision.  She was a 500 mL fluid bolus. ----------------------------------------------------------------------  Monica Harrison returns today for 93-month follow-up: With exception of the ER visit back in May she is doing great.  She may get a little bit short of breath or tired if she overdoes things but is gradually picking up her level of activity.  She completed her therapy about 3 weeks ago and is now try to do some exercise at home and including yard work-gardening.  She is able to gradually do a little more.  She actually looking into going to the mall to do her walking this way she can avoid the heat.  Has not done well trying to walk in the heat even at the end of the day. She said that the ER visit she really thinks was very minimal more related to anxiety she has  a spells where her heart rate will go up and she will feel lightheaded and and short of breath.  Usually well-controlled on with taking Xanax, but oftentimes that does not work.  The time she went to the ER was more medications.  She just felt very anxious and was unable to relax.  She also felt like she was probably a little dehydrated.  She thinks that this may be in part exacerbated by the fact that she has her adrenal insufficiency. Otherwise, she is not having any exertional chest pain  or pressure with rest or exertion.  No PND orthopnea or edema.  No tachycardia spells with exception of these episodes.  No syncope or near syncope TIA or amaurosis fugax.  No claudication. No melena, hematochezia, hematuria or epistaxis.   ROS:  Review of Systems - Negative except baseline fatigue and myalgias; anxiety all of which seem to be doing better.    Objective  Studies Reviewed: Marland Kitchen       No new studies Cath-PCI: 04/02/2021:  1V CAD w/ 100% thrombotic proxRCA; Successful DES PCI prox RCA w/ 2.75 mm x 30 mm DES stent =>  to 3.1 mm. Preserved LVEF; Borderline hypotension requiring Levophed infusion.  Risk Assessment/Calculations:            Physical Exam:   VS:  BP 122/70   Pulse 85   Ht 5\' 3"  (1.6 m)   Wt 181 lb 3.2 oz (82.2 kg)   SpO2 96%   BMI 32.10 kg/m    Wt Readings from Last 3 Encounters:  10/23/22 181 lb 3.2 oz (82.2 kg)  09/08/22 182 lb (82.6 kg)  06/11/22 184 lb 9.6 oz (83.7 kg)    GEN: Well nourished, well developed in no acute distress; healthy-appearing.  Well-groomed and well-nourished. NECK: No JVD; No carotid bruits CARDIAC: Normal S1, S2; RRR, no murmurs, rubs, gallops RESPIRATORY: CTAB w/o W/R/R ; nonlabored, good air movement. ABDOMEN: Soft, non-tender, non-distended EXTREMITIES:  No edema; No deformity     ASSESSMENT AND PLAN: .    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  Doing well.  No recurrent issues of angina.  Seems to finally be improving with her physical activity and has completed her therapy.  Encouraged her to continue to stay active. Had preserved EF with no recurrent angina or heart failure symptoms.  Plan will be to convert from maintenance dose Brilinta to aspirin at the 2-year mark.      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  Sometimes she will take it for couple days sometimes she will take it for a week but is noted to significant fatigue.  Plan: Continue co-Q10 Reduce to 10 g atorvastatin trying to take it 2 to 3 days a week alternating 2 days 1 week and 3 days the next week. After 1 month, if tolerating this regimen of atorvastatin, we will go ahead and start Zetia 10 mg. Recheck labs in 3 months, if still not at goal of LDL  less than 70, will add Nexletol and recheck labs at follow-up. Most recent A1c of 6.7 => would likely warrant therapy, however will defer to PCP.  Potentially consider SGLT2 inhibitor although would be very cautious to avoid dehydration.      Relevant Medications   ezetimibe (ZETIA) 10 MG tablet   atorvastatin (LIPITOR) 10 MG tablet   metoprolol tartrate (LOPRESSOR) 25 MG tablet   Other Relevant Orders   Lipid panel   Comprehensive metabolic panel   Coronary artery disease involving native heart without angina pectoris (Chronic)    Single-vessel disease with inferolateral STEMI/RCA PCI.  No recurrent anginal or CHF symptoms. Lipids not quite at goal now as she has not been able to tolerate the atorvastatin..  Not on any statin medications for blood pressure etc.  Plan: Unfortunately statins and become an issue-will reduce atorvastatin to alternating 2 days a week and 3 days a week 10 mg => add Zetia 10 mg daily after 1 month.  Reassess labs in 3 months and then consider Nexletol. No standing dose of beta-blocker, however with her anxiety attacks and tachycardia spells we will provide prescription for PRN metoprolol 25 mg for elevated BP or heart rate/anxiety (take with Xanax) 6 months into her second year of antiplatelet therapy  for post PCI.  Plan will be to convert from maintenance dose Brilinta 60 mg twice daily to aspirin 81 mg daily at follow-up visit. Will then continue the maintenance dose of Brilinta through December 2024 => okay to hold Brilinta 5 to 7 days preop for surgical procedures. After converting to aspirin 81 mg daily, okay to hold for procedures or surgeries.      Relevant Medications   ezetimibe (ZETIA) 10 MG tablet   atorvastatin (LIPITOR) 10 MG tablet   metoprolol tartrate (LOPRESSOR) 25 MG tablet   Other Relevant Orders   Lipid panel   Comprehensive metabolic panel     Other   Presence of drug coated stent in right coronary artery (Chronic)    18 months out from STEMI with PCI.  Would like to complete 24 months of Brilinta. Plan will be to convert from maintenance dose 60 mg twice daily Brilinta to 81 mg aspirin at next follow-up visit.  Okay to hold Brilinta 5 to 7 days preop for surgeries or procedures.      Bradycardia (Chronic)    Would only use PRN metoprolol      Anxiety (Chronic)    Notable anxiety attacks with intermittent episodes of hypertension.  This tends to trigger her adrenal insufficiency.  We talked about staying adequately hydrated.  She is taking as needed Xanax.  Will add PRN metoprolol 25 mg p.o.for anxiety attacks that she will take in addition to the Xanax.           Dispo: Return in about 6 months (around 04/25/2023) for 6 month follow-up with me, Routine follow up with me.  Total time spent: 39 min spent with patient + 24 min spent charting = 63 min => Extensive time spent discussing both statin management and PRN beta-blocker for anxiety attacks.  Each condition required 5 to 10 minutes for complete explanation.     Signed, Marykay Lex, MD, MS Bryan Lemma, M.D., M.S. Interventional Cardiologist  [facility]  Pager # [phone] Phone # [phone] [address] [place], [place] [address]

## 2022-10-23 NOTE — Assessment & Plan Note (Addendum)
She is trying to take the atorvastatin, but having a hard time with the 20 mg dosing.  Sometimes she will take it for couple days sometimes she will take it for a week but is noted to significant fatigue.  Plan: Continue co-Q10 Reduce to 10 g atorvastatin trying to take it 2 to 3 days a week alternating 2 days 1 week and 3 days the next week. After 1 month, if tolerating this regimen of atorvastatin, we will go ahead and start Zetia 10 mg. Recheck labs in 3 months, if still not at goal of LDL less than 70, will add Nexletol and recheck labs at follow-up. Most recent A1c of 6.7 => would likely warrant therapy, however will defer to PCP.  Potentially consider SGLT2 inhibitor although would be very cautious to avoid dehydration.

## 2022-10-23 NOTE — Patient Instructions (Addendum)
Medication Instructions:   Atorvastatin - Take 10 mg by mouth twice a week and then  alternate with a taking 3 times week by mouth After taking the above Atorvastatin for a one month then start taking Zetia ( Ezetimibe) 10 mg  one tablet daily .   Metoprolol tartrate 25 mg  take as need for anxiety   *If you need a refill on your cardiac medications before your next appointment, please call your pharmacy*   Lab Work: 3 months - fasting  Lipid  cmp If you have labs (blood work) drawn today and your tests are completely normal, you will receive your results only by: MyChart Message (if you have MyChart) OR A paper copy in the mail If you have any lab test that is abnormal or we need to change your treatment, we will call you to review the results.   Testing/Procedures: Not needed   Follow-Up: At [facility], you and your health needs are our priority.  As part of our continuing mission to provide you with exceptional heart care, we have created designated Provider Care Teams.  These Care Teams include your primary Cardiologist (physician) and Advanced Practice Providers (APPs -  Physician Assistants and Nurse Practitioners) who all work together to provide you with the care you need, when you need it.     Your next appointment:   6 month(s)  The format for your next appointment:   In Person  Provider:   Bryan Lemma, MD

## 2022-10-23 NOTE — Telephone Encounter (Signed)
Sorry for the late reply.  Today we did not have any openings in my schedule Can you please see if wants to double book for tomorrow afternoon if she wants to do over video visit.

## 2022-10-23 NOTE — Assessment & Plan Note (Signed)
Would only use PRN metoprolol

## 2022-10-27 ENCOUNTER — Encounter: Payer: Self-pay | Admitting: Cardiology

## 2022-10-27 NOTE — Assessment & Plan Note (Signed)
18 months out from her MI with occluded RCA treated with a long stent.  Doing well.  No recurrent issues of angina.  Seems to finally be improving with her physical activity and has completed her therapy.  Encouraged her to continue to stay active. Had preserved EF with no recurrent angina or heart failure symptoms.  Plan will be to convert from maintenance dose Brilinta to aspirin at the 2-year mark.

## 2022-10-27 NOTE — Assessment & Plan Note (Signed)
Single-vessel disease with inferolateral STEMI/RCA PCI.  No recurrent anginal or CHF symptoms. Lipids not quite at goal now as she has not been able to tolerate the atorvastatin..  Not on any statin medications for blood pressure etc.  Plan: Unfortunately statins and become an issue-will reduce atorvastatin to alternating 2 days a week and 3 days a week 10 mg => add Zetia 10 mg daily after 1 month.  Reassess labs in 3 months and then consider Nexletol. No standing dose of beta-blocker, however with her anxiety attacks and tachycardia spells we will provide prescription for PRN metoprolol 25 mg for elevated BP or heart rate/anxiety (take with Xanax) 6 months into her second year of antiplatelet therapy for post PCI.  Plan will be to convert from maintenance dose Brilinta 60 mg twice daily to aspirin 81 mg daily at follow-up visit. Will then continue the maintenance dose of Brilinta through December 2024 => okay to hold Brilinta 5 to 7 days preop for surgical procedures. After converting to aspirin 81 mg daily, okay to hold for procedures or surgeries.

## 2022-10-29 NOTE — Telephone Encounter (Signed)
Patient has scheduled OV with Dr. Isaiah Serge for 12/04/22 at 0930.

## 2022-11-04 ENCOUNTER — Encounter: Payer: Self-pay | Admitting: Internal Medicine

## 2022-11-04 ENCOUNTER — Telehealth: Payer: Self-pay | Admitting: Internal Medicine

## 2022-11-04 ENCOUNTER — Ambulatory Visit (INDEPENDENT_AMBULATORY_CARE_PROVIDER_SITE_OTHER): Payer: Medicare Other | Admitting: Internal Medicine

## 2022-11-04 VITALS — BP 122/82 | HR 67 | Temp 98.5°F | Ht 63.0 in | Wt 181.0 lb

## 2022-11-04 DIAGNOSIS — J8410 Pulmonary fibrosis, unspecified: Secondary | ICD-10-CM

## 2022-11-04 DIAGNOSIS — J8489 Other specified interstitial pulmonary diseases: Secondary | ICD-10-CM | POA: Diagnosis not present

## 2022-11-04 DIAGNOSIS — R058 Other specified cough: Secondary | ICD-10-CM

## 2022-11-04 MED ORDER — CHLORPHENIRAMINE MALEATE 4 MG PO TABS: 4.0000 mg | ORAL_TABLET | Freq: Two times a day (BID) | ORAL | 0 refills | Status: DC | PRN

## 2022-11-04 MED ORDER — HYDROCOD POLI-CHLORPHE POLI ER 10-8 MG/5ML PO SUER: 5.0000 mL | Freq: Two times a day (BID) | ORAL | 0 refills | Status: DC | PRN

## 2022-11-04 MED ORDER — HYDROCODONE BIT-HOMATROP MBR 5-1.5 MG/5ML PO SOLN: 5.0000 mL | Freq: Four times a day (QID) | ORAL | 0 refills | Status: DC | PRN

## 2022-11-04 NOTE — Progress Notes (Signed)
Monica Harrison    [id number]    [birth year]  Primary Care Physician:Grabowski, Baxter Hire, FNP Date of Appointment: 11/04/2022 Established Patient Visit  Chief complaint:   Chief Complaint  Patient presents with   Acute Visit    Congestion     HPI: Monica Harrison is a 72 y.o. woman who is a patient of Dr. Shirlee More with recurrent bronchitis and MRSA/E. Coli PNA in 2023. 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 CAD s/p PCI and DES in Dec 2022.   Interval Updates: Here for acute visit. Symptoms started a couple of weeks ago with tickle in her throat, congestion. She had a course of augmentin from July 3-12. Symptoms improved. Last week started having cough again with mucus production,sinus congestion. She does a neti-pot twice daily. She started taking more augmentin from her husband.   She has an albuterol nebulizer which she is taking three times/day. This helps a lot.   No wheezing. Persistent symptom is night time cough. She props herself up on pillows.   No fevers or chills. Appetite is ok.  She has had negative covid, flu and strep test.    I have reviewed the patient's family social and past medical history and updated as appropriate.   Past Medical History:  Diagnosis Date   Adrenal insufficiency (HCC)    Bronchitis, mucopurulent recurrent (HCC)    CAD (coronary artery disease), native coronary artery 04/01/2021   a. s/p Inferior STEMI in 03/2021: 100% p-mRCA (DES PCI); Residual 10% prox LCx stenosis and 20% mid LCx stenosis   Hyperlipidemia    MRSA pneumonia (HCC) 09/2015   Associated with a E. coli pneumonia-diagnosed by bronchoscopy.   OSA (obstructive sleep apnea) 2015   Sleep study showed mild obstructive events only when lying supine => not on CPAP.   Pneumonia due to COVID-19 virus 11/2018   Hospitalized August 2022 for COVID-19-required lengthy treatment with IV steroids along with remdesivir  and baricitinib.  She eventually discharged home on oral prednisone taper and nightly 2 L [place] oxygen. 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  One 3.1-second pause at 8 AM.  Sinus bradycardia with junctional escape beats down to 28 bpm noted between 630 and 7:30 AM.  Junctional rhythm bradycardia noted at 2 AM.  Occasional PACs (2.2%) with 2 short atrial runs fastest and longest was 7 beats at 128 bpm. = Bradycardia & Pauses reviewed & cleared by EP - avoid AVN agents   CORONARY/GRAFT ACUTE MI REVASCULARIZATION N/A 04/01/2021   Procedure: Coronary/Graft Acute MI Revascularization;  Surgeon: Marykay Lex, MD;  Location: [facility] INVASIVE CV LAB;  Service: Cardiovascular;  Successful DES PCI of the proximal RCA using 2.75 mm x 30 mm DES stent postdilated to 3.1 mm.   LEFT HEART CATH AND CORONARY ANGIOGRAPHY N/A 04/01/2021   Procedure: LEFT HEART CATH AND CORONARY ANGIOGRAPHY;  Surgeon: Marykay Lex, MD;  Location[facility] INVASIVE CV LAB;  Service: Cardiovascular; Severe single-vessel CAD with 100% thrombotic occlusion  of the prox RCA (-> DES PCI); Preserved LVEF; Borderline hypotension requiring Levophed infusion.   TRANSTHORACIC ECHOCARDIOGRAM  04/02/2021   (Immediately post Inferior STEMI) hyperdynamic LV, EF 65 to 70%.  No RWMA.  Moderate basal septal hypertrophy.  Indeterminate diastolic pressures (grade 1 diastolic function).  Mildly reduced RV function but unable assess PAP.  RAP estimated at 15 mmHg with mild right atrial dilation.  Normal mitral and aortic valves.    Family History  Problem Relation Age of  Onset   Lung disease Neg Hx    Heart disease Neg Hx     Social History   Occupational History   Not on file  Tobacco Use   Smoking status: Never   Smokeless tobacco: Never  Vaping Use   Vaping status: Never Used  Substance and Sexual Activity   Alcohol use: No   Drug use: No   Sexual activity: Not on file     Physical Exam: Blood pressure 122/82, pulse 67, temperature 98.5 F (36.9 C), temperature source Oral, height 5\' 3"  (1.6 m), weight 181 lb (82.1 kg), SpO2 97%.  Gen:      No acute distress ENT:  no nasal polyps, mucus membranes moist Lungs:    No increased respiratory effort, symmetric chest wall excursion, clear to auscultation bilaterally, no wheezes or crackles CV:         Regular rate and rhythm; no murmurs, rubs, or gallops.  No pedal edema   Data Reviewed: Imaging: I have personally reviewed the Nov 2023 show mild patchy ground glass  PFTs:     Latest Ref Rng & Units 08/31/2021    8:42 AM  PFT Results  FVC-Pre L 2.52   FVC-Predicted Pre % 89   FVC-Post L 2.76   FVC-Predicted Post % 98   Pre FEV1/FVC % % 60   Post FEV1/FCV % % 67   FEV1-Pre L 1.50   FEV1-Predicted Pre % 71   FEV1-Post L 1.86   DLCO uncorrected ml/min/mmHg 17.32   DLCO UNC% % 92   DLCO corrected ml/min/mmHg 17.32   DLCO COR %Predicted % 92   DLVA Predicted % 108   TLC L 5.05   TLC % Predicted % 103   RV % Predicted % 103    I have personally reviewed the patient's PFTs and mild airflow limitation  Labs: Autoimmune serologies negative  Immunization status:  There is no immunization history on file for this patient.  External Records Personally Reviewed: pulmonary  Assessment:  Post viral cough syndrome Recent URI Post-covid fibrosis with mild NSIP  Plan/Recommendations: Sorry you aren't feeling well. Start taking hydromet cough syrup to help suppress the cough.   Can take the chlorpheniramine at night to help with drainage and cough.   Use honey as a cough  suppressant in your tea.   Continue albuterol nebulizer as needed. You can drop down to twice a day if symptoms are improving and then once a day over the course of the next two weeks. It is safe to keep using.   Talk with your doctor about a boost of the hydrocortisone for your adrenal insufficiency.   REST! :)   Return to Care: Return in about 3 months (around 02/04/2023). With Dr. Arvin Collard, MD Pulmonary and Critical Care Medicine [facility] [phone]

## 2022-11-04 NOTE — Telephone Encounter (Signed)
Called and spoke with patient. Patient aware new cough medication was sent to pharmacy.  Nothing further at this time.

## 2022-11-04 NOTE — Patient Instructions (Addendum)
Follow up with Dr. Isaiah Serge in 3 months.   Sorry you aren't feeling well. Start taking hydromet cough syrup to help suppress the cough.   Can take the chlorpheniramine at night to help with drainage and cough.   Use honey as a cough suppressant in your tea.   Continue albuterol nebulizer as needed. You can drop down to twice a day if symptoms are improving and then once a day over the course of the next two weeks. It is safe to keep using.   Talk with your doctor about a boost of the hydrocortisone for your adrenal insufficiency.   REST! :)

## 2022-11-04 NOTE — Telephone Encounter (Signed)
Called and spoke with patient. Patient stated her pharmacy no longer carries Hydromet cough syrup.  Patient stated pharmacist recommends  Tussionex or Robitussin AC. Patient stated she had no preference.   Message routed to Dr. Celine Mans to advise

## 2022-11-12 ENCOUNTER — Ambulatory Visit (HOSPITAL_BASED_OUTPATIENT_CLINIC_OR_DEPARTMENT_OTHER): Payer: Medicare Other | Admitting: Primary Care

## 2022-12-04 ENCOUNTER — Ambulatory Visit: Payer: Medicare Other | Admitting: Pulmonary Disease

## 2022-12-04 ENCOUNTER — Telehealth: Payer: Self-pay | Admitting: *Deleted

## 2022-12-04 MED ORDER — BRILINTA 60 MG PO TABS: 60.0000 mg | ORAL_TABLET | Freq: Two times a day (BID) | ORAL | 3 refills | Status: AC

## 2022-12-04 NOTE — Telephone Encounter (Signed)
Received a fax from [facility]  Requesting the provider to submit a new prescription for Brilinta 60 mg  twice a day .   Current  prescription is not  active on file per request.    E- sent  new Rx  for 90 day supply with 3 refills.

## 2023-01-15 IMAGING — DX DG CHEST 2V
2 series · 2 of 2 positions shown · non-contrast
Comparison: 09/03/2015

CLINICAL DATA: Dyspnea

EXAM:
CHEST - 2 VIEW

[chest pa]
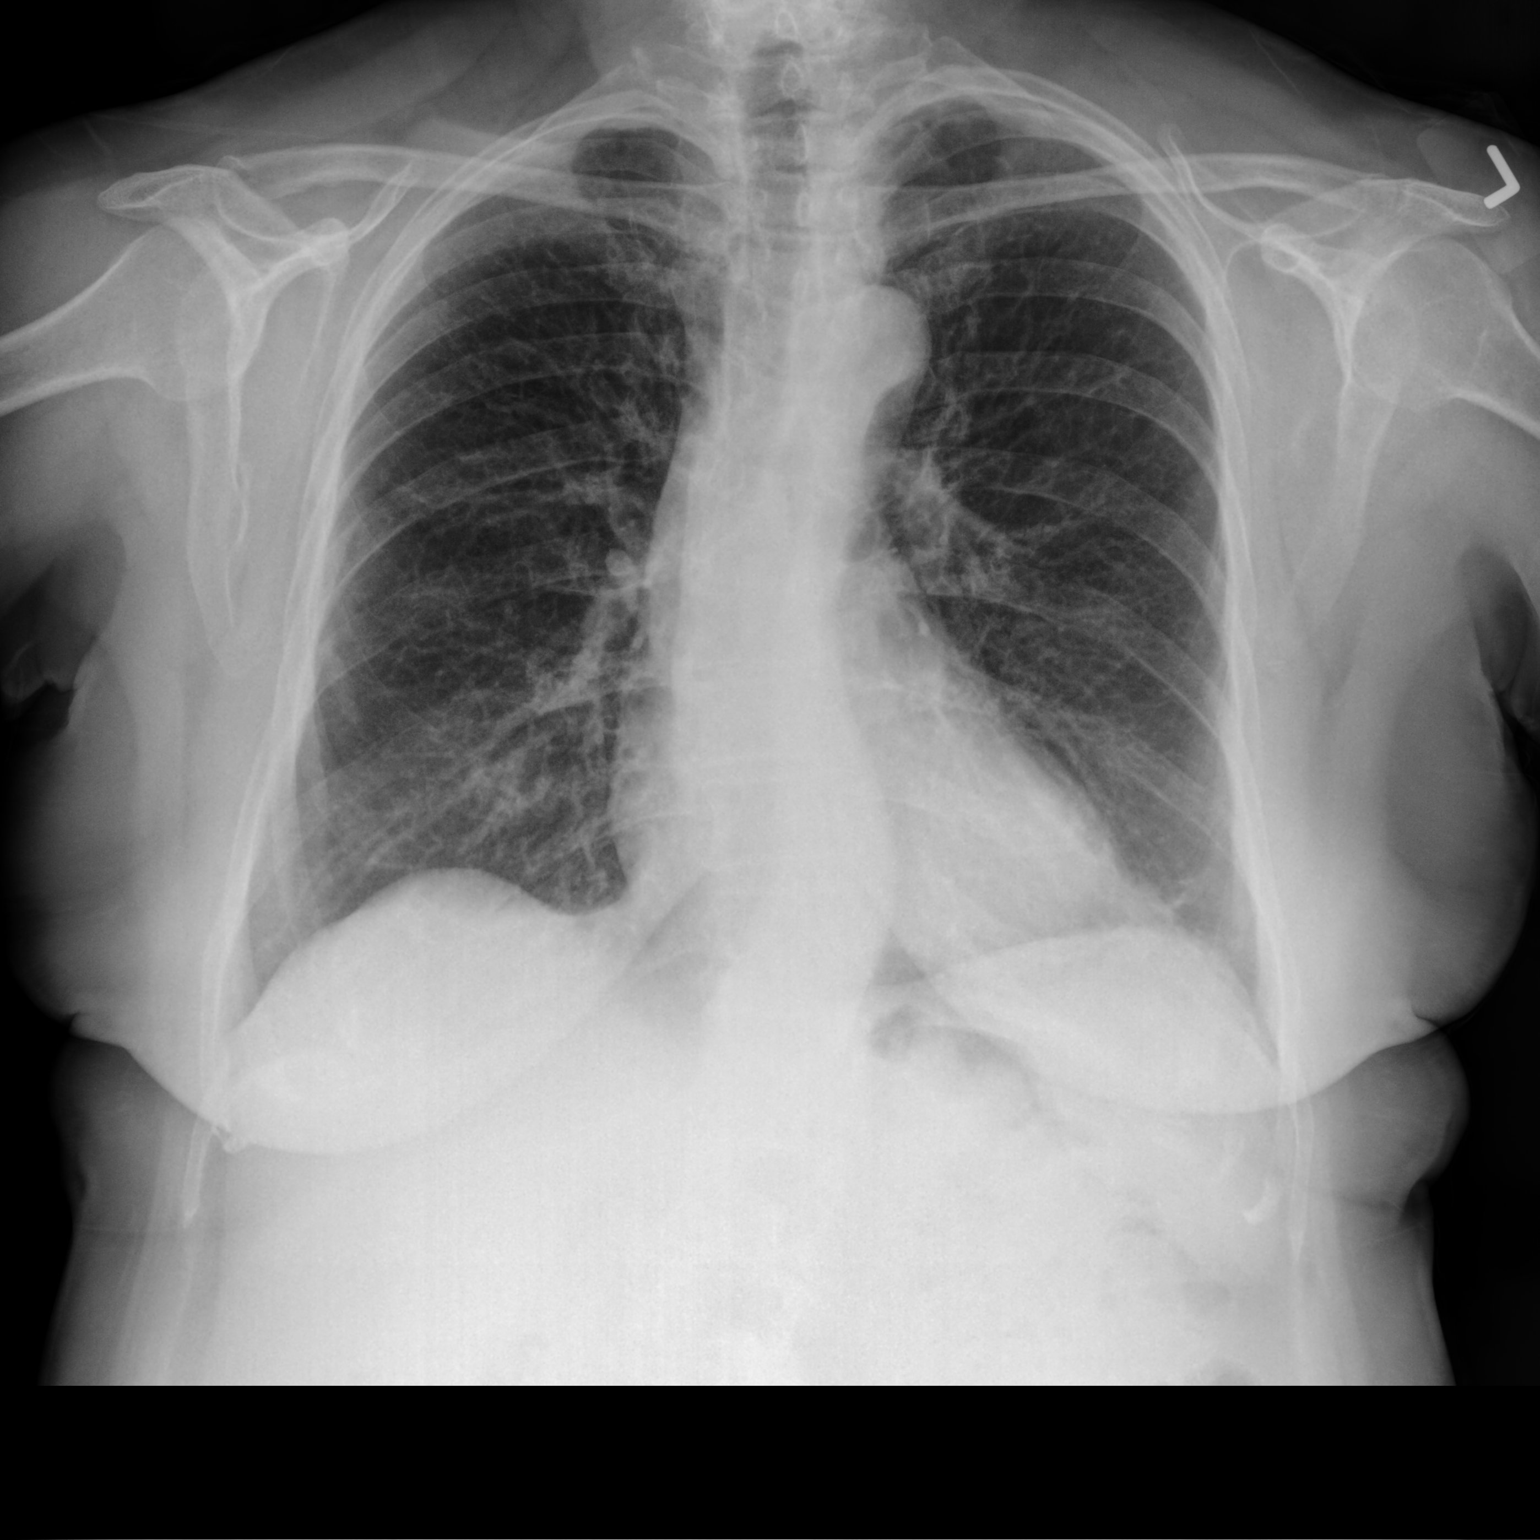

[chest lat]
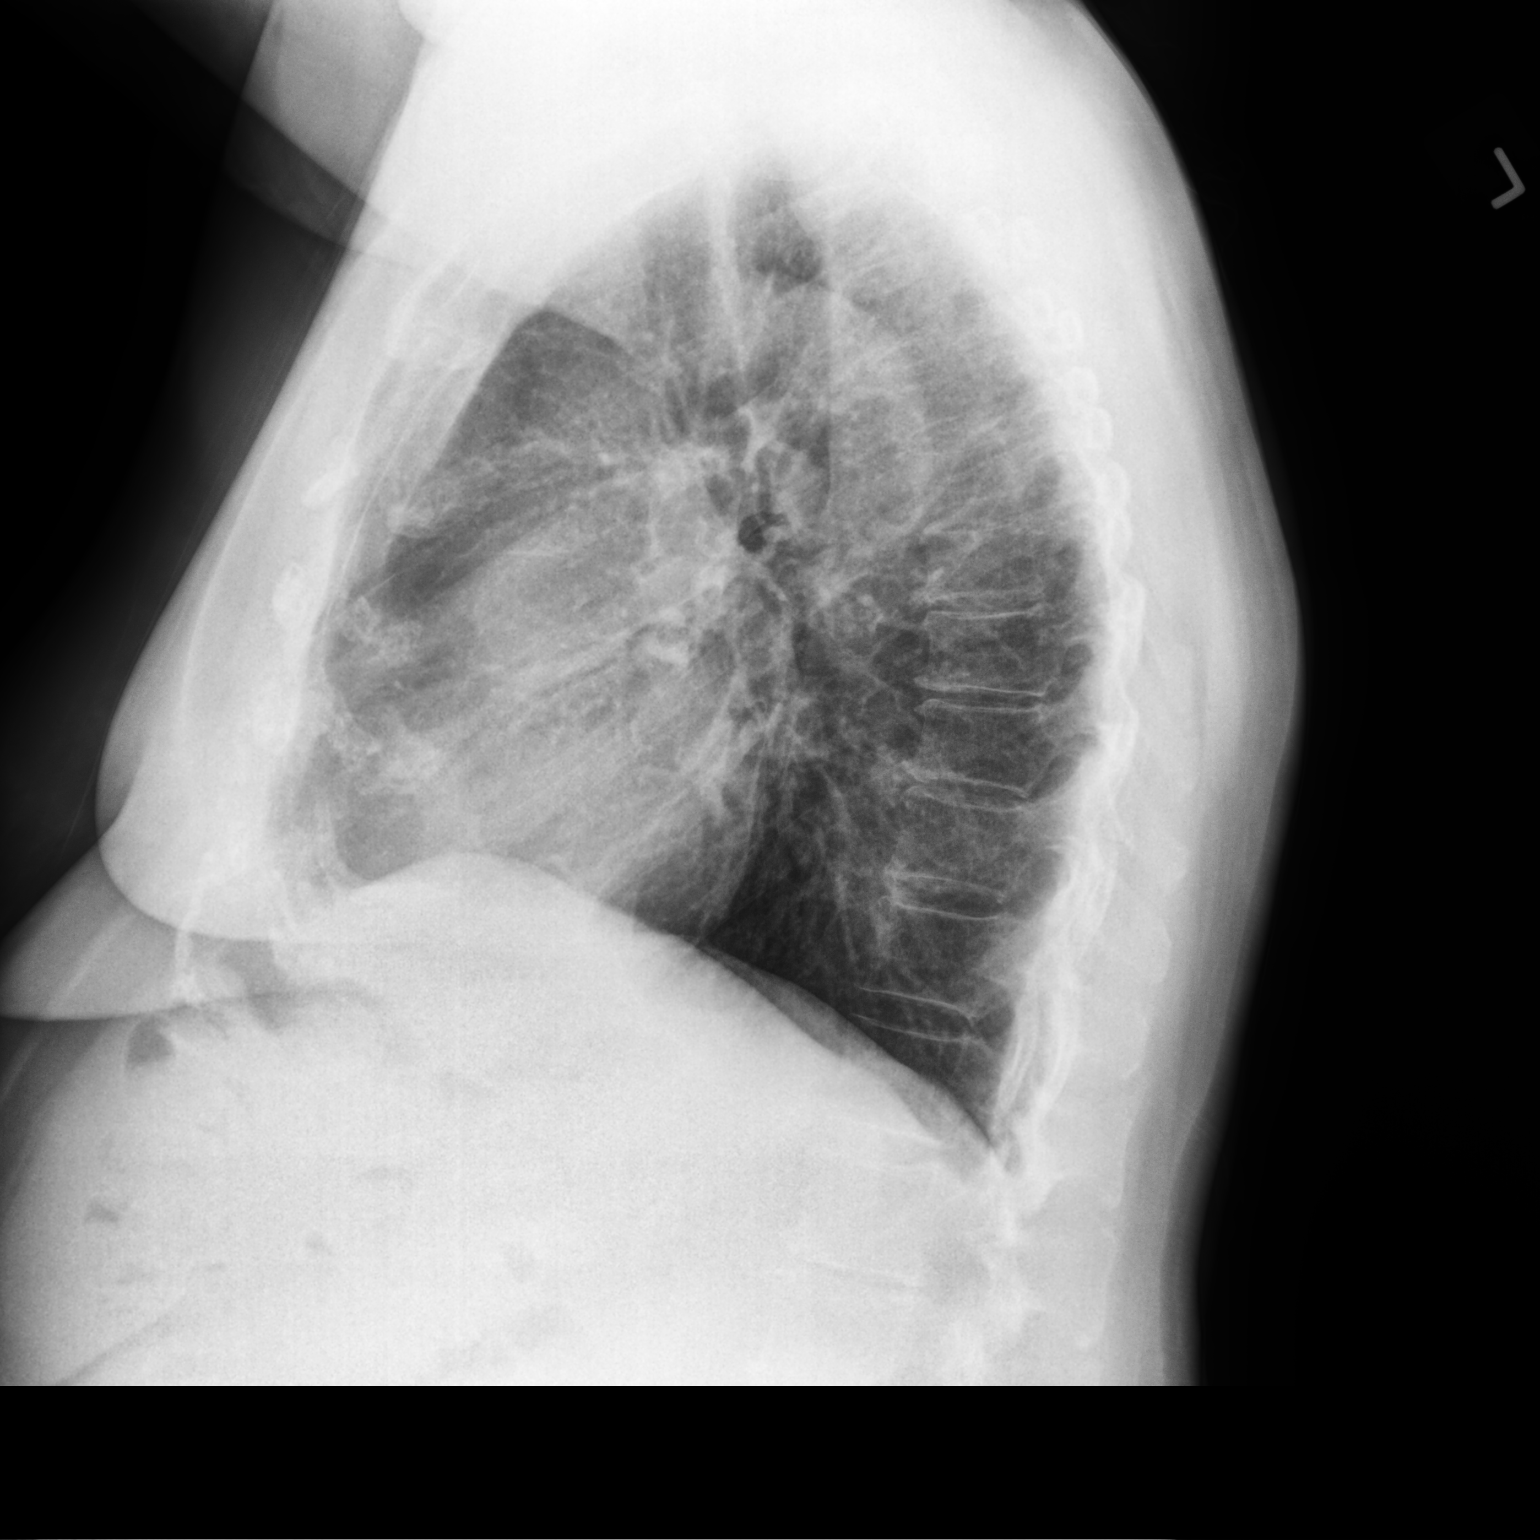

[2 of 2 positions shown; findings below may reference images not displayed]

FINDINGS: The heart size and mediastinal contours are within normal limits.
Diffuse bilateral interstitial prominence. Reticulonodular airspace
opacity within the right lung base. No pleural effusion or
pneumothorax. The visualized skeletal structures are unremarkable.
IMPRESSION: 1. Reticulonodular airspace opacity within the right lung base
suspicious for pneumonia. Radiographic follow-up to resolution is
recommended.
2. Diffuse bilateral interstitial prominence, which may reflect
bronchitic type lung changes.

## 2023-02-25 ENCOUNTER — Ambulatory Visit (HOSPITAL_BASED_OUTPATIENT_CLINIC_OR_DEPARTMENT_OTHER)
Admission: RE | Admit: 2023-02-25 | Discharge: 2023-02-25 | Disposition: A | Discharge status: Home / Self Care | Payer: Medicare Other | Source: Ambulatory Visit | Attending: Pulmonary Disease | Admitting: Pulmonary Disease

## 2023-02-25 DIAGNOSIS — J1282 Pneumonia due to coronavirus disease 2019: Secondary | ICD-10-CM | POA: Diagnosis present

## 2023-02-25 DIAGNOSIS — U071 COVID-19: Secondary | ICD-10-CM | POA: Insufficient documentation

## 2023-03-20 ENCOUNTER — Telehealth: Payer: Self-pay | Admitting: Pulmonary Disease

## 2023-03-20 NOTE — Telephone Encounter (Signed)
I called and discussed with the patient. Breathing issues started last month on 24th. She is currently on Augmentin day 10/10. Covid, Flu and strep test are negative.she also took increased dose of hydrocortisone under the guidance of her endocrinologist. She is already on hydrocortisone for adrenal insufficiency  Breathing is better but not completley back to normal. Nothing further needed now as she is slowly improving. We will reassess at scheduled visit with APP next week.   CT on 02/25/2023 did not show any acute process but it was before the symptoms started.

## 2023-03-20 NOTE — Telephone Encounter (Signed)
Patient is a cold and has had it for about 12 days now. Coughing up mucus, nose congestion and have ben on Augmentin for 10 days and has been taken hydrocodone. She has been using a nettie pot and breathing treatments as well. Please call and advise. [phone]   Pharmacy: [facility]

## 2023-03-25 ENCOUNTER — Encounter: Payer: Self-pay | Admitting: Primary Care

## 2023-03-25 ENCOUNTER — Ambulatory Visit: Payer: Medicare Other | Admitting: Primary Care

## 2023-03-25 VITALS — BP 126/72 | HR 73 | Temp 96.8°F | Ht 63.0 in | Wt 177.8 lb

## 2023-03-25 DIAGNOSIS — J8410 Pulmonary fibrosis, unspecified: Secondary | ICD-10-CM | POA: Diagnosis not present

## 2023-03-25 DIAGNOSIS — J329 Chronic sinusitis, unspecified: Secondary | ICD-10-CM | POA: Diagnosis not present

## 2023-03-25 DIAGNOSIS — J40 Bronchitis, not specified as acute or chronic: Secondary | ICD-10-CM

## 2023-03-25 DIAGNOSIS — Z8616 Personal history of COVID-19: Secondary | ICD-10-CM | POA: Diagnosis not present

## 2023-03-25 NOTE — Progress Notes (Signed)
@Patient  ID: Monica Harrison, female    DOB: [birth year], 72 y.o.   MRN: [id number]  No chief complaint on file.   Referring provider: Tacy Learn, FNP  HPI: 72 year old female, never smoked.  Hx recurrent bronchitis and MRSA/E. Coli PNA in 2023. 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 CAD s/p PCI and DES in Dec 2022. Patient of Dr. Isaiah Serge.   Previous [facility] pulmonary encounter: 07/24/2021 Patient presents today for acute overview due to chest congestion. She fell Friday March 7th. States that it was raining and she slipped on her wood floors and sustained a large hematoma to her right hip. She developed chest congestion on Sunday. Having to clear her throat often, coughing up some sputum. She took left over Augmentin he had on hand x 2 days. She is starting to feel some better. No associated shortness of breath, chest tightness or wheezing.    03/25/2023- Interim hx  Discussed the use of AI scribe software for clinical note transcription with the patient, who gave verbal consent to proceed.  Patient presents today for follow-up 6 month follow-up post covid-19 and chronic bronchitis/asthma. CT with basal interstitial lung disease, from March 2022 consistent with post-COVID changes.  Patient no longer on supplemental oxygen.  Completed pulmonary rehab. Pulmonary function testing with mild obstructive shin and bronchodilator response consistent with asthma.  Not currently on maintenance bronchodilator regimen, using albuterol as needed.  She reports a recent sinus infection, which began approximately 16 days ago, described as "the crud." The patient has been managing the infection with a 10-day course of Augmentin and Tessalon Perles, which she completed last Thursday. Despite this, she reports persistent symptoms, including coughing up a small amount of mucus and thick clear nasal discharge.  In response to the infection, the patient  increased her hydrocortisone dosage from her baseline of 10mg  in the morning and 5mg  in the afternoon to 20mg  in the morning and 10mg  in the afternoon. She also increased her breathing treatments to three times a day and has been using a neti pot for sinus rinses three times a day.  The patient also reports a history of snoring, which she describes as positional. A sleep study conducted a year ago was negative for sleep apnea.  In addition to her prescribed medications, the patient has been taking a variety of vitamins and has recently started on NAC, an antioxidant supplement, which she believes has helped her lungs. She also reports taking Mucinex and Robitussin DM for her current sinus infection.  The patient's recent CT scan showed stable findings, with no progression of the lung scarring post-COVID. She also has aortic atherosclerosis and some plaque buildup in the coronary arteries. The patient is currently on Brilinta and follows with cardiology. Intolerant to atorvastatin.   The patient has been managing her health proactively, including participating in rehab following her COVID infection and continuing with some of the exercises she learned. She expresses a need to start walking more.      Allergies  Allergen Reactions   Avelox [Moxifloxacin Hcl In Nacl] Anaphylaxis   Biaxin [Clarithromycin] Anaphylaxis   Lipitor [Atorvastatin]     myalgias   Lactose Intolerance (Gi) Other (See Comments)    Bloating, gas, congestion   Tobramycin Other (See Comments)    Severe headaches    Yeast-Derived Drug Products Swelling    Tongue swelling /candida     There is no immunization history on file for this patient.  Past Medical History:  Diagnosis Date   Adrenal insufficiency (HCC)    Bronchitis, mucopurulent recurrent (HCC)    CAD (coronary artery disease), native coronary artery 04/01/2021   a. s/p Inferior STEMI in 03/2021: 100% p-mRCA (DES PCI); Residual 10% prox LCx stenosis and 20%  mid LCx stenosis   Hyperlipidemia    MRSA pneumonia (HCC) 09/2015   Associated with a E. coli pneumonia-diagnosed by bronchoscopy.   OSA (obstructive sleep apnea) 2015   Sleep study showed mild obstructive events only when lying supine => not on CPAP.   Pneumonia due to COVID-19 virus 11/2018   Hospitalized August 2022 for COVID-19-required lengthy treatment with IV steroids along with remdesivir and baricitinib.  She eventually discharged home on oral prednisone taper and nightly 2 L [place] oxygen. 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     albuterol (VENTOLIN HFA) 108 (90 Base) MCG/ACT inhaler Inhale 2 puffs into the lungs every 6 (six) hours as needed for wheezing or shortness of breath. 8 g 5   ALPRAZolam (XANAX) 0.25 MG tablet Take 0.25 mg by mouth at bedtime.     amoxicillin-clavulanate (AUGMENTIN) 875-125 MG tablet Take 1 tablet by mouth 2 (two) times daily. (Patient not taking: Reported on 11/04/2022)     Ascorbic Acid (VITAMIN C) 1000 MG tablet Take 2,000 mg by mouth 2 (two) times daily.     atorvastatin (LIPITOR) 10 MG tablet Take 10 mg by mouth twice a week and then  alternate with a taking 3 times week by mouth 45 tablet 3   B Complex Vitamins (B COMPLEX 50 PO) Take 1 tablet by mouth daily.      BRILINTA 60 MG TABS tablet Take 1 tablet (60 mg total) by mouth 2 (two) times daily. 180 tablet 3   Calcium-Magnesium-Zinc (CAL-MAG-ZINC PO) Take 2 tablets by mouth at  bedtime.     chlorpheniramine-HYDROcodone (TUSSIONEX) 10-8 MG/5ML Take 5 mLs by mouth every 12 (twelve) hours as needed for cough. 115 mL 0   Cholecalciferol (VITAMIN D3) 5000 units CAPS Take 5,000 Units by mouth at bedtime.     Coenzyme Q10 (CO Q 10) 100 MG CAPS Take 100 mg by mouth every evening.      diclofenac Sodium (VOLTAREN) 1 % GEL Apply 1 application. topically 4 (four) times daily.     Digestive Enzymes (ENZYME DIGEST) CAPS Take 1 capsule by mouth 2 (two) times daily with a meal.      ezetimibe (ZETIA) 10 MG tablet Take 1 tablet (10 mg total) by mouth daily. (Patient not taking: Reported on 11/04/2022) 90 tablet 3   fluconazole (DIFLUCAN) 150 MG tablet Take 150 mg by mouth every other day.     hydrocortisone (CORTEF) 5 MG tablet Take 5 mg by mouth See admin instructions. TAKE 10MG  IN THE MORNING AND 5 MG IN THE AFTERNOON.     Magnesium 400 MG TABS Take 400 mg by mouth at  bedtime.     metoprolol tartrate (LOPRESSOR) 25 MG tablet Take 25 mg tablet  by mouth as needed for anxiety 10 tablet 6   Multiple Vitamin (MULTIVITAMIN WITH MINERALS) TABS tablet Take 3 tablets by mouth daily. Pure-[place]     mupirocin ointment (BACTROBAN) 2 % Apply 1 Application topically as needed.     nitroGLYCERIN (NITROSTAT) 0.4 MG SL tablet Place 1 tablet (0.4 mg total) under the tongue every 5 (five) minutes as needed. 25 tablet 2   nystatin cream (MYCOSTATIN) Apply 1 application. topically 2 (two) times daily as needed for rash.     Omega-3 Fatty Acids (OMEGA 3 PO) Take 2 capsules by mouth daily.     Polyethyl Glycol-Propyl Glycol 0.4-0.3 % SOLN Place 1 drop into both eyes 4 (four) times daily.      polyethylene glycol (MIRALAX / GLYCOLAX) 17 g packet Take 17 g by mouth daily as needed for mild constipation.     predniSONE (DELTASONE) 5 MG tablet Take 5 mg by mouth daily. Pt says stopped taking makes her feel like heart racing (Patient not taking: Reported on 11/04/2022)     Probiotic Product (PROBIOTIC PO) Take 1  capsule by mouth every evening.      thyroid (ARMOUR THYROID) 90 MG tablet Take 45 mg by mouth in the morning.     thyroid (ARMOUR) 30 MG tablet Take 30 mg by mouth every evening.     vitamin A 78295 UNIT capsule Take 10,000 Units by mouth every evening.      No facility-administered medications prior to visit.    Review of Systems  Review of Systems  Constitutional:  Positive for fatigue.  HENT:  Positive for congestion and postnasal drip.   Respiratory:  Positive for cough. Negative for shortness of breath.   Cardiovascular: Negative.     Physical Exam  There were no vitals taken for this visit. Physical Exam Constitutional:      General: She is not in acute distress.    Appearance: Normal appearance. She is not ill-appearing.  HENT:     Head: Normocephalic and atraumatic.  Cardiovascular:     Rate and Rhythm: Normal rate and regular rhythm.  Pulmonary:     Effort: Pulmonary effort is normal.     Breath sounds: Normal breath sounds. No wheezing, rhonchi or rales.  Neurological:     General: No focal deficit present.     Mental Status: She is alert and oriented to person, place, and time. Mental status is at baseline.      Lab Results:  CBC    Component Value Date/Time   WBC 9.0 09/08/2022 1043   RBC 4.37 09/08/2022 1043   HGB 13.5 09/08/2022 1043   HCT 40.0 09/08/2022 1043   PLT 248 09/08/2022 1043   MCV 91.5 09/08/2022 1043   MCH 30.9 09/08/2022 1043   MCHC 33.8 09/08/2022 1043   RDW 12.6 09/08/2022 1043   LYMPHSABS 2.2 09/08/2022 1043   MONOABS 0.9 09/08/2022 1043   EOSABS 0.1 09/08/2022 1043   BASOSABS 0.1 09/08/2022 1043    BMET    Component Value Date/Time   NA 139 10/18/2022 1000   K 4.4 10/18/2022 1000   CL 104 10/18/2022 1000   CO2 22 10/18/2022 1000   GLUCOSE 131 (H) 10/18/2022 1000   GLUCOSE 154 (H) 09/08/2022 1043   BUN 15 10/18/2022 1000   CREATININE 0.80 10/18/2022 1000   CALCIUM 10.3 10/18/2022 1000   GFRNONAA >60 09/08/2022 1043  GFRAA >60 03/09/2016 1352    BNP No results found for: "BNP"  ProBNP No results found for: "PROBNP"  Imaging: CT Chest High Resolution  Result Date: 03/16/2023 CLINICAL DATA:  72 year old female with prior history of COVID pneumonia. Evaluate for residual postinfectious scarring. EXAM: CT CHEST WITHOUT CONTRAST TECHNIQUE: Multidetector CT imaging of the chest was performed following the standard protocol without intravenous contrast. High resolution imaging of the lungs, as well as inspiratory and expiratory imaging, was performed. RADIATION DOSE REDUCTION: This exam was performed according to the departmental dose-optimization program which includes automated exposure control, adjustment of the mA and/or kV according to patient size and/or use of iterative reconstruction technique. COMPARISON:  High-resolution chest CT 02/21/2022. FINDINGS: Cardiovascular: Heart size is normal. There is no significant pericardial fluid, thickening or pericardial calcification. There is aortic atherosclerosis, as well as atherosclerosis of the great vessels of the mediastinum and the coronary arteries, including calcified atherosclerotic plaque in the left main, left anterior descending, left circumflex and right coronary arteries. Mediastinum/Nodes: No pathologically enlarged mediastinal or hilar lymph nodes. Please note that accurate exclusion of hilar adenopathy is limited on noncontrast CT scans. Esophagus is unremarkable in appearance. No axillary lymphadenopathy. Lungs/Pleura: High-resolution images again demonstrate some very mild patchy peripheral predominant ground-glass attenuation, septal thickening and subpleural reticulation scattered throughout the lungs bilaterally with no discernible craniocaudal gradient and no substantial progression compared to the prior examination. Inspiratory and expiratory imaging demonstrates some mild air trapping indicative of mild small airways disease. No substantial traction  bronchiectasis or honeycombing noted. No acute consolidative airspace disease. No pleural effusions. No definite suspicious appearing pulmonary nodules or masses are noted. Upper Abdomen: Aortic atherosclerosis. Calcified gallstone in the fundus of the gallbladder. Diffuse low attenuation throughout the visualized hepatic parenchyma, indicative of a background of hepatic steatosis. Partially imaged low-attenuation lesion in the anterior aspect of the interpolar region of the right kidney, incompletely characterized on today's noncontrast CT examination, but statistically likely a cyst (no imaging follow-up recommended). Musculoskeletal: There are no aggressive appearing lytic or blastic lesions noted in the visualized portions of the skeleton. IMPRESSION: 1. Stable findings in the lungs once again considered most compatible with an alternative diagnosis (not usual interstitial pneumonia) per current ATS guidelines. Overall, findings are once again favored to reflect nonspecific interstitial pneumonia (NSIP). 2. Aortic atherosclerosis, in addition to left main and three-vessel coronary artery disease. Assessment for potential risk factor modification, dietary therapy or pharmacologic therapy may be warranted, if clinically indicated. 3. Hepatic steatosis. 4. Cholelithiasis. Aortic Atherosclerosis (ICD10-I70.0). Electronically Signed   By: Trudie Reed M.D.   On: 03/16/2023 06:21     Assessment & Plan:      Post-COVID Pulmonary Fibrosis -Stable findings on recent CT scan. No current respiratory distress reported. -Continue current management and follow-up as needed.   Sinobronchitis  -Recent sinus infection treated with a 10-day course of Augmentin. Still experiencing some mucus production and cough. -Continue Mucinex and saline rinses (reduce to twice daily). -Consider use of chlorpheniramine 4mg  every 4-6 hours as needed for symptom relief. -Add flutter valve to use as needed to loosen chest  congestion   Adrenal Insufficiency - Pateint increased hydrocortisone dose during recent illness as per previous instructions. Lungs were clear on exam. Advised patient resume regular dose 10mg  in the morning and 5mg  in the afternoon.  Coronary Artery Disease -Continue current medication and follow-up with cardiologist as planned.  Sleep Apnea Previous sleep study negative. Reports of snoring and irregular breathing. -No change in  management. Consider repeat sleep study if symptoms worsen.  General Health Maintenance -Ok to continue N-acetylcysteine (NAC) 600mg  daily for lung health. No reported interactions with Brilinta. -Continue current exercise regimen and consider increasing walking as tolerated.  -Follow up in 6 months or sooner if needed       Glenford Bayley, NP 03/25/2023

## 2023-03-25 NOTE — Patient Instructions (Addendum)
 -  POST-COVID PULMONARY FIBROSIS: This condition involves scarring of the lungs following a COVID-19 infection. Your recent CT scan shows stable findings with no progression. Continue with your current management and follow up as needed.  -SINUSITIS: Sinusitis is an inflammation or swelling of the tissue lining the sinuses. You recently completed a course of antibiotics but still have some symptoms. Continue using Mucinex and saline rinses, but reduce the rinses to twice daily. You can also use chlorpheniramine as needed for symptom relief.  Recommendations:  Use flutter valve 5-10 breaths two-three times a day to loosen chest congestion as needed Continue mucinex/robitussin-dm as needed for congestion Contineu saline nasal rinses 2-3 times a day as needed for nasal congestion Use chlorpheniramine 4mg  every 6 hours as needed for post nasal drip/sinus congestion Resume regular dose of hydrocortisone   Follow-up 6 months with Dr. Isaiah Serge or sooner if needed

## 2023-04-30 ENCOUNTER — Encounter: Payer: Self-pay | Admitting: Pulmonary Disease

## 2023-04-30 ENCOUNTER — Ambulatory Visit: Payer: Medicare Other | Admitting: Pulmonary Disease

## 2023-04-30 VITALS — BP 124/64 | HR 80 | Temp 97.4°F | Ht 63.0 in | Wt 182.8 lb

## 2023-04-30 DIAGNOSIS — J8410 Pulmonary fibrosis, unspecified: Secondary | ICD-10-CM | POA: Diagnosis not present

## 2023-04-30 DIAGNOSIS — J4541 Moderate persistent asthma with (acute) exacerbation: Secondary | ICD-10-CM

## 2023-04-30 MED ORDER — COMPRESSOR/NEBULIZER MISC: 0 refills | Status: AC

## 2023-04-30 MED ORDER — BREO ELLIPTA 50-25 MCG/INH IN AEPB: 1.0000 | INHALATION_SPRAY | Freq: Every day | RESPIRATORY_TRACT | 3 refills | Status: DC

## 2023-04-30 MED ORDER — BENZONATATE 100 MG PO CAPS: 100.0000 mg | ORAL_CAPSULE | Freq: Four times a day (QID) | ORAL | 3 refills | Status: DC | PRN

## 2023-04-30 MED ORDER — BUDESONIDE-FORMOTEROL FUMARATE 160-4.5 MCG/ACT IN AERO: 2.0000 | INHALATION_SPRAY | Freq: Two times a day (BID) | RESPIRATORY_TRACT | 11 refills | Status: AC

## 2023-04-30 NOTE — Addendum Note (Signed)
 Addended by: Shreyas Piatkowski M on: 04/30/2023 01:58 PM   Modules accepted: Orders

## 2023-04-30 NOTE — Patient Instructions (Addendum)
 VISIT SUMMARY:  During today's visit, we discussed your ongoing respiratory symptoms following a significant sinus infection. You have been managing your symptoms with various treatments, including Augmentin , Mucinex , Robitussin DM, and Tessalon  Perles, and have seen some improvement. We also addressed your need for a new nebulizer machine and discussed the benefits of pulmonary rehabilitation.  YOUR PLAN:  -POST-VIRAL BRONCHITIS: Post-viral bronchitis is inflammation of the bronchial tubes following a viral infection, leading to symptoms like cough and mucus production. You should continue using Tessalon  Perles as needed for your cough and start using the Breo inhaler and rinsing your mouth after each use. Be aware of potential side effects such as hoarseness and palpitations.  -NEBULIZER USE: Your current nebulizer machine is not working properly. We will order a new nebulizer machine for you through a Durable Medical Equipment (DME) company.  -PULMONARY REHABILITATION: Pulmonary rehabilitation involves exercises and therapies to help improve lung function. We will refer you to Aniceto Barley at [facility] for pulmonary therapy.  INSTRUCTIONS:  Please follow up in 6 months to review your progress and make any necessary adjustments to your treatment plan.

## 2023-04-30 NOTE — Progress Notes (Signed)
 Monica Harrison    [id number]    1950-06-18  Primary Care Physician:Grabowski, Starling Eck, FNP  Referring Physician: Joette Mustard, FNP [address] [place],  [place] [address]  Chief complaint: Follow-up for bronchitis, post COVID-19  HPI: 73 y.o.  with history of allergies, bronchitis, adrenal insufficiency [treated in [place]], Lyme's disease, angioedema  She has history of recurrent bronchitis, developed MRSA, E. coli pneumonia diagnosed with bronchoscopy in June 2017.  This was treated as an outpatient Has occasional seasonal allergies and is on albuterol  which she uses occasionally. Has occasional snoring, especially when she is fatigued.  She was tested with a sleep apnea around 2015 and was told that she had mild obstructive events only when she was lying supine.  Currently not on treatment with CPAP  Hospitalized for COVID-19 in August of 2022.  Required lengthy treatment with IV steroids, remdesivir  and baricitinib .  She required high flow nasal cannula which was weaned down and was discharged on oral prednisone  taper and 2 L oxygen at night  She is enrolled in pulmonary rehab with some improvement in symptoms.  She still has some hair loss and is wearing a break Had a follow-up CT showing post-COVID changes.  Echocardiogram was also done for evaluation of enlarged PA on CT scan.  There is no evidence of pulmonary hypertension.  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  Underwent cardiac catheterization with stenting of RCA.  She has followed up with cardiology at [facility].  Pets: Dog Occupation: Operates a Administrator, [employer] Exposures: No mold, hot tub, Jacuzzi.  No feather pillows or comforter Smoking history: Never smoker Travel history: No significant travel history Relevant family history: No significant family history of lung disease  Interim history: Discussed the use of AI scribe software for clinical note  transcription with the patient, who gave verbal consent to proceed.  The patient, with a history of post-COVID interstitial lung disease and myelasma, presents with a prolonged course of respiratory symptoms. She reports a significant sinus infection, which she attributes to exposure to cold air at a restaurant. The symptoms began with throat clearing and progressed to chest rattling and rib soreness. She was initially treated with Augmentin , which led to some improvement but not complete resolution of symptoms. She also used Mucinex  and Robitussin DM, which she reports helped. She was subsequently seen by her nurse practitioner who noted mucus in the lower lungs and prescribed a course of prednisone , Augmentin , and Tessalon  Perles. The patient reports feeling significantly better after this treatment. She continues to have some mucus production, which she manages with nebulizer treatments twice daily. She also reports a history of using NAC for her lungs, but is unsure of its efficacy. She has been managing her symptoms with vitamins, green drinks, and nasal rinses. She also reports a need for a new nebulizer machine as her current one is failing.   Outpatient Encounter Medications as of 04/30/2023  Medication Sig   acetaminophen  (TYLENOL ) 325 MG tablet Take 650 mg by mouth every 6 (six) hours as needed.   Acetylcysteine (NAC) 600 MG CAPS Take 1 capsule by mouth in the morning and at bedtime.   albuterol  (VENTOLIN  HFA) 108 (90 Base) MCG/ACT inhaler Inhale 2 puffs into the lungs every 6 (six) hours as needed for wheezing or shortness of breath.   ALPRAZolam  (XANAX ) 0.25 MG tablet Take 0.25 mg by mouth at bedtime.   Ascorbic Acid  (VITAMIN  C) 1000 MG tablet Take 2,000 mg by mouth 2 (two) times daily.   atorvastatin  (LIPITOR ) 10 MG tablet Take 10 mg by mouth twice a week and then  alternate with a taking 3 times week by mouth   B Complex Vitamins (B COMPLEX 50 PO) Take 1 tablet by mouth daily.    BRILINTA   60 MG TABS tablet Take 1 tablet (60 mg total) by mouth 2 (two) times daily.   Calcium -Magnesium-Zinc  (CAL-MAG-ZINC  PO) Take 2 tablets by mouth at bedtime.   Cholecalciferol (VITAMIN D3) 5000 units CAPS Take 5,000 Units by mouth at bedtime.   Coenzyme Q10 (CO Q 10) 100 MG CAPS Take 100 mg by mouth every evening.    diclofenac Sodium (VOLTAREN) 1 % GEL Apply 1 application. topically 4 (four) times daily.   Digestive Enzymes (ENZYME DIGEST) CAPS Take 1 capsule by mouth 2 (two) times daily with a meal.    fluconazole  (DIFLUCAN ) 150 MG tablet Take 150 mg by mouth daily as needed.   hydrocortisone  (CORTEF ) 5 MG tablet Take 5 mg by mouth See admin instructions. TAKE 10MG  IN THE MORNING AND 5 MG IN THE AFTERNOON.   Magnesium 400 MG TABS Take 400 mg by mouth at bedtime.   metoprolol  tartrate (LOPRESSOR ) 25 MG tablet Take 25 mg tablet  by mouth as needed for anxiety   Multiple Vitamin (MULTIVITAMIN WITH MINERALS) TABS tablet Take 3 tablets by mouth daily. Pure-[employer]   nitroGLYCERIN  (NITROSTAT ) 0.4 MG SL tablet Place 1 tablet (0.4 mg total) under the tongue every 5 (five) minutes as needed.   nystatin cream (MYCOSTATIN) Apply 1 application. topically 2 (two) times daily as needed for rash.   Omega-3 Fatty Acids (OMEGA 3 PO) Take 2 capsules by mouth daily.   Polyethyl Glycol-Propyl Glycol 0.4-0.3 % SOLN Place 1 drop into both eyes 4 (four) times daily.    Probiotic Product (PROBIOTIC PO) Take 1 capsule by mouth every evening.    thyroid  (ARMOUR THYROID ) 90 MG tablet Take 45 mg by mouth in the morning and at bedtime.   vitamin A 10000 UNIT capsule Take 10,000 Units by mouth every evening.    [DISCONTINUED] ezetimibe  (ZETIA ) 10 MG tablet Take 1 tablet (10 mg total) by mouth daily. (Patient not taking: Reported on 11/04/2022)   [DISCONTINUED] thyroid  (ARMOUR THYROID ) 90 MG tablet Take 45 mg by mouth in the morning.   [DISCONTINUED] thyroid  (ARMOUR) 30 MG tablet Take 30 mg by mouth every evening.   No  facility-administered encounter medications on file as of 04/30/2023.    Physical Exam: Blood pressure 124/64, pulse 80, temperature (!) 97.4 F (36.3 C), temperature source Oral, height 5\' 3"  (1.6 m), weight 182 lb 12.8 oz (82.9 kg), SpO2 96%. Gen:      No acute distress HEENT:  EOMI, sclera anicteric Neck:     No masses; no thyromegaly Lungs:    Clear to auscultation bilaterally; normal respiratory effort CV:         Regular rate and rhythm; no murmurs Abd:      + bowel sounds; soft, non-tender; no palpable masses, no distension Ext:    No edema; adequate peripheral perfusion Skin:      Warm and dry; no rash Neuro: alert and oriented x 3 Psych: normal mood and affect   Data Reviewed: Imaging: Chest x-ray 09/03/2015- mild atelectasis at the left base.   High-resolution CT 06/21/2020-no evidence of interstitial lung disease, mild centrilobular nodularity, 20 pulmonary reduce measuring 4 mm High-res CT 02/01/2021-basilar predominant  peripheral groundglass, linear densities consistent with COVID-19.  Enlarged pulmonary artery Chest x-ray 04/01/2021-improvement in bilateral airspace disease Chest x-ray 07/22/2021-continued improvement of bilateral opacities. High resolution CT 02/21/2022-improvement in inflammatory changes.  There are residual mild groundglass, subpleural reticulation in the peripheries. High resolution CT 02/25/2023-stable mild interstitial changes in alternate diagnosis. I reviewed the images personally.  PFTs: 09/26/2015 FVC 3.40 [116%], FEV1 2.64 [125%], F/F 78 Normal spirometry  08/31/2021 FVC 2.76 [98%], FEV1 1.86 [88%], F/F 67, TLC 5.05 [103%], DLCO 17.32 [92%] Mild obstructive airway disease with bronchodilator response  Cardiac: Echocardiogram 02/16/2021 LVEF 60-65%, mild LVH, grade 1 diastolic dysfunction.  Normal PA systolic pressure  Sleep: Home sleep study 12/23/2021-no evidence of sleep apnea, no desats at night.  Assessment:  Post-viral  bronchitis Asthma Recent prolonged respiratory symptoms following a suspected viral or bacterial infection. Symptoms improved with Augmentin , Mucinex , Robitussin DM, and Tessalon  Perles. She has a history of asthma, COPD on record but no objective evidence of obstruction on PFTs PFTs reviewed with mild obstruction bronchodilator response suggestive of asthma.  She is not on controller medication but given recent exacerbation we discussed restarting ARB.  -Continue Tessalon  Perles as needed for cough. -Start Breo. Rinse mouth after each use. -Check for potential side effects such as hoarseness and palpitations.  Nebulizer use Current nebulizer machine is failing. -Order a new nebulizer machine through DME company.  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  This is new from March 2022 and is consistent with post-COVID changes.  Later CT scan reviewed with stable changes. Off oxygen during daytime.  She can also stop oxygen at night as written sleep study does not show nocturnal desats.  Pulmonary rehabilitation Previous benefit from pulmonary therapy. -Referral to Aniceto Barley at [facility] for pulmonary therapy.   Daytime fatigue No evidence of sleep apnea on home sleep study.  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  Echo reviewed with no evidence of pulmonary hypertension  Follow-up in 6 months.   Plan/Recommendations: Pulmonary rehab Nebulizers Start Breo  Aceson Labell MD [facility] 04/30/2023, 1:24 PM  CC: Joette Mustard, FNP

## 2023-05-02 ENCOUNTER — Other Ambulatory Visit (HOSPITAL_COMMUNITY): Payer: Self-pay

## 2023-05-02 ENCOUNTER — Telehealth: Payer: Self-pay

## 2023-05-02 NOTE — Telephone Encounter (Signed)
*  Pulm  Pharmacy Patient Advocate Encounter   Received notification from CoverMyMeds that prior authorization for Symbicort 160-4.5MCG/ACT aerosol  is required/requested.   Insurance verification completed.   The patient is insured through [facility] .   Per test claim:  Earlie Server is preferred by the insurance.  If suggested medication is appropriate, Please send in a new RX and discontinue this one. If not, please advise as to why it's not appropriate so that we may request a Prior Authorization. Please note, some preferred medications may still require a PA

## 2023-05-13 NOTE — Telephone Encounter (Signed)
Per pharm team-Please clarify what medication patient is to continue. Test claims show Earlie Server was filled at pharmacy 01/15 but has been d/c in patients chart. Virgel Bouquet is a preferred medication over Symbicort.    Last ov indicates pt is to take [facility] inhaler   Dr Isaiah Serge- please advise what strength you want her on. Thanks.

## 2023-05-19 ENCOUNTER — Other Ambulatory Visit: Payer: Self-pay

## 2023-05-19 DIAGNOSIS — J4541 Moderate persistent asthma with (acute) exacerbation: Secondary | ICD-10-CM

## 2023-05-19 MED ORDER — ALBUTEROL SULFATE HFA 108 (90 BASE) MCG/ACT IN AERS: 2.0000 | INHALATION_SPRAY | Freq: Four times a day (QID) | RESPIRATORY_TRACT | 5 refills | Status: DC | PRN

## 2023-05-19 MED ORDER — IPRATROPIUM-ALBUTEROL 0.5-2.5 (3) MG/3ML IN SOLN: 3.0000 mL | RESPIRATORY_TRACT | 11 refills | Status: DC | PRN

## 2023-05-19 NOTE — Telephone Encounter (Signed)
I called and spoke with patient.  She feels he is doing well and does not want daily controller inhaler She prefers to use her albuterol inhaler as needed and wants an order for nebulizer machine and DuoNebs as needed to be sent to DME as her regular pharmacy does not carry these.  Nothing further needed.

## 2023-05-19 NOTE — Addendum Note (Signed)
Addended byChilton Greathouse on: 05/19/2023 09:14 AM   Modules accepted: Orders

## 2023-05-21 ENCOUNTER — Telehealth: Payer: Self-pay | Admitting: Pulmonary Disease

## 2023-05-21 NOTE — Telephone Encounter (Signed)
 Monica Harrison; Monica Harrison RAMAN  Our pharmacy stated the below.  The order for Nebulizer medication Duoneb from Dr. Praveen Mannam. The progress notes that were sent over dated 04/30/2023 with the order are Insufficient for Medicare, the medication is not listed by name. Can you follow up with the MD office to get the notes 04/30/2023 amended to list the Duoneb that is being ordered.

## 2023-05-22 LAB — LIPID PANEL
Chol/HDL Ratio: 3.6 {ratio} (ref 0.0–4.4)
Cholesterol, Total: 163 mg/dL (ref 100–199)
HDL: 45 mg/dL (ref >39)
LDL Chol Calc (NIH): 97 mg/dL (ref 0–99)
Triglycerides: 114 mg/dL (ref 0–149)
VLDL Cholesterol Cal: 21 mg/dL (ref 5–40)

## 2023-05-22 LAB — COMPREHENSIVE METABOLIC PANEL
ALT: 40 [IU]/L — ABNORMAL HIGH (ref 0–32)
AST: 34 [IU]/L (ref 0–40)
Albumin: 4.6 g/dL (ref 3.8–4.8)
Alkaline Phosphatase: 82 [IU]/L (ref 44–121)
BUN/Creatinine Ratio: 22 (ref 12–28)
BUN: 16 mg/dL (ref 8–27)
Bilirubin Total: 0.4 mg/dL (ref 0.0–1.2)
CO2: 18 mmol/L — ABNORMAL LOW (ref 20–29)
Calcium: 10 mg/dL (ref 8.7–10.3)
Chloride: 106 mmol/L (ref 96–106)
Creatinine, Ser: 0.73 mg/dL (ref 0.57–1.00)
Globulin, Total: 2.6 g/dL (ref 1.5–4.5)
Glucose: 138 mg/dL — ABNORMAL HIGH (ref 70–99)
Potassium: 4.5 mmol/L (ref 3.5–5.2)
Sodium: 141 mmol/L (ref 134–144)
Total Protein: 7.2 g/dL (ref 6.0–8.5)
eGFR: 87 mL/min/{1.73_m2} (ref >59)

## 2023-05-28 ENCOUNTER — Ambulatory Visit: Payer: Medicare Other | Admitting: Cardiology

## 2023-05-31 ENCOUNTER — Encounter: Payer: Self-pay | Admitting: Cardiology

## 2023-06-05 ENCOUNTER — Telehealth: Payer: Self-pay | Admitting: Cardiology

## 2023-06-05 NOTE — Telephone Encounter (Signed)
Called pt to schedule her appt with Dr. Rennis Golden, as Dr. Herbie Baltimore wanted her to see him.  Patient refused stating she can't take injections it makes her sick.  I advised her Dr. Rennis Golden would be able to find her different medication option patient refused to schedule.  She stated she would like a call from Dr. Elissa Hefty nurse to go over her recent lab results.

## 2023-06-05 NOTE — Telephone Encounter (Signed)
Monica Lex, MD 05/31/2023  1:31 PM EST     Unfortunately, compared to 7 months ago, the lipid panel has gotten worse.  LDL is now up from 79-97. Triglycerides thankfully are better-down to 114, and HDL is better at 45.   Were not getting where we would need to go with the current regimen.  She had previously been seen by Dr. Rennis Golden for lipid management and the was consideration of possibly trying Leqvio.  It would be nice to get her back into see Dr. Rennis Golden or one of the Pharm.D.'s if necessary to see if we can get her started on an injectable for her lipids.   Bryan Lemma, MD   Patient identification verified by 2 forms. Marilynn Rail, RN    Called and spoke to patient  Relayed result message  Patient states:   -was unable to take previous cholesterol Rx due to side effects   -previously spoke to Dr. Rennis Golden, does not wish to take injections  Informed patient:   -OV with Dr. Rennis Golden can be to review other options beside injections  RN offered to schedule OV with Dr. Rennis Golden, patient states she will call back when she has her schedule available  Patient has no further questions at this time

## 2023-06-25 ENCOUNTER — Telehealth: Payer: Self-pay | Admitting: Pulmonary Disease

## 2023-06-25 NOTE — Telephone Encounter (Signed)
 Monica Harrison states patient doing physical therapy once a week. Patient states suppose to be twice a week. Noticed no difference. [name] phone number is [phone].

## 2023-06-25 NOTE — Telephone Encounter (Signed)
 Spoke to [employer] with [facility]. She stated that patient has been doing PT once weekly. Pt was under the impression that she should be doing PT twice weekly. Silva Bandy stated that she has not noticed a difference with once weekly.   Dr. Isaiah Serge, please advise. Thanks

## 2023-07-09 ENCOUNTER — Other Ambulatory Visit: Payer: Self-pay | Admitting: Pulmonary Disease

## 2023-07-09 MED ORDER — IPRATROPIUM-ALBUTEROL 0.5-2.5 (3) MG/3ML IN SOLN: 3.0000 mL | RESPIRATORY_TRACT | 11 refills | Status: DC | PRN

## 2023-07-09 MED ORDER — BENZONATATE 100 MG PO CAPS: 100.0000 mg | ORAL_CAPSULE | Freq: Four times a day (QID) | ORAL | 3 refills | Status: DC | PRN

## 2023-07-09 NOTE — Telephone Encounter (Signed)
 Copied from CRM [id number]. Topic: Clinical - Medication Refill >> Jul 09, 2023  3:27 PM Gaetano Hawthorne wrote: Most Recent Primary Care Visit:   Medication:  benzonatate (TESSALON) 100 MG capsule  Has the patient contacted their pharmacy? Yes, they need a new order, patient doesn't want to use mail order anymore. (Agent: If no, request that the patient contact the pharmacy for the refill. If patient does not wish to contact the pharmacy document the reason why and proceed with request.) (Agent: If yes, when and what did the pharmacy advise?)  Is this the correct pharmacy for this prescription? Yes If no, delete pharmacy and type the correct one.  This is the patient's preferred pharmacy:  [facility] - [place], [place] - [address] [place] [address] Phone: [phone] Fax: [phone]   Has the prescription been filled recently? No  Is the patient out of the medication? No  Has the patient been seen for an appointment in the last year OR does the patient have an upcoming appointment? Yes  Can we respond through MyChart? Yes  Agent: Please be advised that Rx refills may take up to 3 business days. We ask that you follow-up with your pharmacy.

## 2023-07-09 NOTE — Telephone Encounter (Signed)
 Copied from CRM [id number]. Topic: Clinical - Medication Refill >> Jul 09, 2023  3:21 PM Gaetano Hawthorne wrote: Most Recent Primary Care Visit:   Medication: ipratropium-albuterol (DUONEB) 0.5-2.5 (3) MG/3ML SOLN  Has the patient contacted their pharmacy? Yes, advised that they need an order to fill - patient does not want to use mail order (Agent: If no, request that the patient contact the pharmacy for the refill. If patient does not wish to contact the pharmacy document the reason why and proceed with request.) (Agent: If yes, when and what did the pharmacy advise?)  Is this the correct pharmacy for this prescription? Yes If no, delete pharmacy and type the correct one.  This is the patient's preferred pharmacy:  [facility] - [place], [place] - [address] [place] [address] Phone: [phone] Fax: [phone]   Has the prescription been filled recently? No  Is the patient out of the medication? Yes  Has the patient been seen for an appointment in the last year OR does the patient have an upcoming appointment? Yes  Can we respond through MyChart? Yes  Agent: Please be advised that Rx refills may take up to 3 business days. We ask that you follow-up with your pharmacy.

## 2023-07-18 NOTE — Telephone Encounter (Signed)
 Called [name] and there was no answer- LMTCB to give VO for  Please increase physical therapy to twice a week.

## 2023-07-18 NOTE — Telephone Encounter (Signed)
 Please increase physical therapy to twice a week.

## 2023-07-21 NOTE — Telephone Encounter (Signed)
 Monica Harrison ret your call. Please try again. I told her about the twice a week. She still needs a Call back. [phone]

## 2023-07-23 ENCOUNTER — Encounter: Payer: Self-pay | Admitting: Cardiology

## 2023-07-23 ENCOUNTER — Ambulatory Visit: Payer: Medicare Other | Attending: Cardiology | Admitting: Cardiology

## 2023-07-23 VITALS — BP 122/72 | HR 66 | Ht 63.0 in | Wt 181.2 lb

## 2023-07-23 DIAGNOSIS — I2510 Atherosclerotic heart disease of native coronary artery without angina pectoris: Secondary | ICD-10-CM | POA: Insufficient documentation

## 2023-07-23 DIAGNOSIS — Z955 Presence of coronary angioplasty implant and graft: Secondary | ICD-10-CM | POA: Insufficient documentation

## 2023-07-23 DIAGNOSIS — E1169 Type 2 diabetes mellitus with other specified complication: Secondary | ICD-10-CM | POA: Insufficient documentation

## 2023-07-23 DIAGNOSIS — I2119 ST elevation (STEMI) myocardial infarction involving other coronary artery of inferior wall: Secondary | ICD-10-CM | POA: Insufficient documentation

## 2023-07-23 DIAGNOSIS — F064 Anxiety disorder due to known physiological condition: Secondary | ICD-10-CM | POA: Insufficient documentation

## 2023-07-23 DIAGNOSIS — E785 Hyperlipidemia, unspecified: Secondary | ICD-10-CM | POA: Insufficient documentation

## 2023-07-23 DIAGNOSIS — F410 Panic disorder [episodic paroxysmal anxiety] without agoraphobia: Secondary | ICD-10-CM | POA: Insufficient documentation

## 2023-07-23 MED ORDER — CHOLESTYRAMINE 4 GM/DOSE PO POWD: 4.0000 g | Freq: Three times a day (TID) | ORAL | 12 refills | Status: AC

## 2023-07-23 MED ORDER — ATORVASTATIN CALCIUM 10 MG PO TABS: ORAL_TABLET | ORAL | 3 refills | Status: AC

## 2023-07-23 MED ORDER — NITROGLYCERIN 0.4 MG SL SUBL: 0.4000 mg | SUBLINGUAL_TABLET | SUBLINGUAL | 5 refills | Status: AC | PRN

## 2023-07-23 NOTE — Progress Notes (Unsigned)
  Cardiology Office Note:  .   Date:  07/23/2023  ID:  Ottie Glazier, DOB [birth year], MRN [id number] PCP: Tacy Learn, FNP  [facility] Providers Cardiologist:  Bryan Lemma, MD { Click to update primary MD,subspecialty MD or APP then REFRESH:1}    No chief complaint on file.   Patient Profile: .     Monica Harrison is an obese 73 y.o. female  with a PMH reviewed below who presents here for 28-month follow-up She returns at the request of Tacy Learn, FNP.  PMH : CAD (inferior STEMI-RCA PCI), history bradycardia, borderline hypertension, HLD (statin intolerant), chronic renal insufficiency, Chronic Fatigue-Sleep Disordered Breathing, History of Adrenal Insufficiency   04/01/2021: Inferior STEMI: 100% proximal-mid RCA-DES PCI.  EF 65 to 70%. Zio patch placed because of bradycardia, no beta-blocker. Has been referred [facility] therapy [facility] the [facility]  Has been doing very well [employer] [facility]) Statin intolerant-referred to lipid management clinic with Dr. Rennis Golden ---------------------------------------------------------------------   Ottie Glazier was last seen on October 23, 2022 Plan was blood pressure twice daily, Lipitor 10 mg (trying to increase from 2 to 4 days a week), Zetia 10 mg daily.  Added co-Q10  Subjective  .HPISECCONSENTABRIDGE   Cardiovascular ROS: {roscv:310661}  ROS:  Review of Systems - {ros master:310782}    Objective    Studies Reviewed: Marland Kitchen        ECHO: *** CATH: *** MONITOR: *** CT: ***  Risk Assessment/Calculations:   {Does this patient have ATRIAL FIBRILLATION?:[phone]} No BP recorded.  {Refresh Note OR Click here to enter BP  :1}***         Physical Exam:   VS:  Ht 5\' 3"  (1.6 m)   Wt 181 lb 3.2 oz (82.2 kg)   BMI 32.10 kg/m    Wt Readings from Last 3 Encounters:  07/23/23 181 lb 3.2 oz (82.2 kg)  04/30/23 182 lb 12.8 oz (82.9 kg)  03/25/23 177 lb 12.8 oz (80.6 kg)    GEN: Well  nourished, well developed in no acute distress; *** NECK: No JVD; No carotid bruits CARDIAC: Normal S1, S2; RRR, no murmurs, rubs, gallops RESPIRATORY:  Clear to auscultation without rales, wheezing or rhonchi ; nonlabored, good air movement. ABDOMEN: Soft, non-tender, non-distended EXTREMITIES:  No edema; No deformity      ASSESSMENT AND PLAN: .    Problem List Items Addressed This Visit       Cardiology Problems   Coronary artery disease involving native heart without angina pectoris - Primary (Chronic)   Relevant Orders   EKG 12-Lead   Hyperlipidemia associated with type 2 diabetes mellitus (HCC) (Chronic)   Relevant Orders   EKG 12-Lead    Assessment and Plan Assessment & Plan        {Are you ordering a CV Procedure (e.g. stress test, cath, DCCV, TEE, etc)?   Press F2        :[id number]}   Follow-Up: No follow-ups on file.  Total time spent: *** min spent with patient + *** min spent charting = *** min    Signed, Marykay Lex, MD, MS Bryan Lemma, M.D., M.S. Interventional Cardiologist  [facility]  Pager # [phone] Phone # [phone] [address] [place], [place] [address]

## 2023-07-23 NOTE — Telephone Encounter (Signed)
 Spoke to [place] and relayed below message.  Silva Bandy stated that patient is stable and she recommends once weekly vs twice weekly.  Dr. Isaiah Serge, please advise. Thanks

## 2023-07-23 NOTE — Patient Instructions (Signed)
 Medication Instructions:   Instruction on how to take Cholestyarmine 4 gm  For the first mont take in the morning one pack  with  a meal, after a one month try then  increase to 1 pack twice a day and if you are able to tolerate for  another one month  then increase to 2 packs in the morning and 1 pack in the afternoon   *If you need a refill on your cardiac medications before your next appointment, please call your pharmacy*   Lab Work: fasting Lipids in March 2026    If you have labs (blood work) drawn today and your tests are completely normal, you will receive your results only by: [employer] (if you have MyChart) OR A paper copy in the mail If you have any lab test that is abnormal or we need to change your treatment, we will call you to review the results.   Testing/Procedures:  Not needed  Follow-Up: At [facility], you and your health needs are our priority.  As part of our continuing mission to provide you with exceptional heart care, we have created designated Provider Care Teams.  These Care Teams include your primary Cardiologist (physician) and Advanced Practice Providers (APPs -  Physician Assistants and Nurse Practitioners) who all work together to provide you with the care you need, when you need it.     Your next appointment:   12 month(s)  The format for your next appointment:   In Person  Provider:   Bryan Lemma, MD    Other Instructions

## 2023-07-24 ENCOUNTER — Encounter: Payer: Self-pay | Admitting: Cardiology

## 2023-07-24 DIAGNOSIS — F064 Anxiety disorder due to known physiological condition: Secondary | ICD-10-CM | POA: Insufficient documentation

## 2023-07-24 NOTE — Assessment & Plan Note (Addendum)
 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  No chest pain, pressure, or palpitations. Blood pressure controlled. No recent panic attacks, no extra metoprolol needed. Currently she remains on Brilinta and asked about converting to aspirin - Continue current medications. - Encourage physical activity and hydration. -Upon completion of current refills of Brilinta, she will stop Brilinta and switch to aspirin 81 mg daily,  - Continue atorvastatin 10 mg 2 days a week.  => She refused injection medications. -> Add Questran/cholestyramine trying to titrate up to 2 scoops twice daily from 1 scoop once a day. - Has not required additional blood control and therefore we will hold off on beta-blocker

## 2023-07-24 NOTE — Assessment & Plan Note (Signed)
 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  Now more than 2 years out symptomatically doing well, but GDMT is limited by her lack of medication tolerance including lipid-lowering agents as well as blood pressure control agents.

## 2023-07-24 NOTE — Assessment & Plan Note (Signed)
 Over 2 years out from STEMI Currently on Brilinta, will transition to aspirin 81 mg daily. Addressed concerns about Brilinta side effects, reassured about tiredness and cold sensations. - Complete current Brilinta supply, then switch to aspirin 81 mg daily.

## 2023-07-24 NOTE — Assessment & Plan Note (Signed)
 Cholesterol levels above target, especially post-MI. Statin adverse reactions, hesitant about injectables. Discussed alternatives like cholestyramine and plant sterols. Emphasized LDL reduction to lower MI risk. - Initiate cholestyramine 4g daily for one month, increase to twice daily if tolerated. - Consider plant sterols like Colestid if interested. - Recheck lipids in one year before follow-up. - Encourage dietary modifications and regular exercise.

## 2023-07-29 ENCOUNTER — Other Ambulatory Visit: Payer: Self-pay

## 2023-07-29 ENCOUNTER — Telehealth: Payer: Self-pay

## 2023-07-29 DIAGNOSIS — J4541 Moderate persistent asthma with (acute) exacerbation: Secondary | ICD-10-CM

## 2023-07-29 DIAGNOSIS — J8410 Pulmonary fibrosis, unspecified: Secondary | ICD-10-CM

## 2023-07-29 MED ORDER — IPRATROPIUM-ALBUTEROL 0.5-2.5 (3) MG/3ML IN SOLN: 3.0000 mL | RESPIRATORY_TRACT | 11 refills | Status: DC | PRN

## 2023-07-29 NOTE — Telephone Encounter (Signed)
 Copied from CRM [id number]. Topic: Clinical - Prescription Issue >> Jul 29, 2023  9:01 AM Juliana Ocean wrote: Reason for CRM: pt wants her nebulizer sent to [facility] on [address] [place] Pt wants the solution sent to [facility]. [facility] calling. The pharmacy dies nt supply the machine. Sandy from her pcp calling Cb [phone]

## 2023-07-29 NOTE — Telephone Encounter (Signed)
 Per chart: Type Date User Summary Attachment  General 07/10/2023 12:30 PM Monica Harrison [facility] -  Note: [facility] sent    [facility]oke with [facility], they state they received the order but it has no information as to what is needed. They are requesting a new order with correct information.   New order placed.

## 2023-07-29 NOTE — Telephone Encounter (Unsigned)
 Copied from CRM [id number]. Topic: Clinical - Prescription Issue >> Jul 29, 2023  9:01 AM Monica Harrison wrote: Reason for CRM: pt wants her nebulizer sent to [facility] on [address] [place] Pt wants the solution sent to [facility]. [facility] calling. The pharmacy dies nt supply the machine. Sandy from her pcp calling [facility] [phone]

## 2023-08-08 IMAGING — DX DG CHEST 1V PORT
1 series · 1 of 1 positions shown · non-contrast
Comparison: 11/21/2020

CLINICAL DATA: [id number] positive.  Respiratory failure

EXAM:
PORTABLE CHEST 1 VIEW

[chest ap]
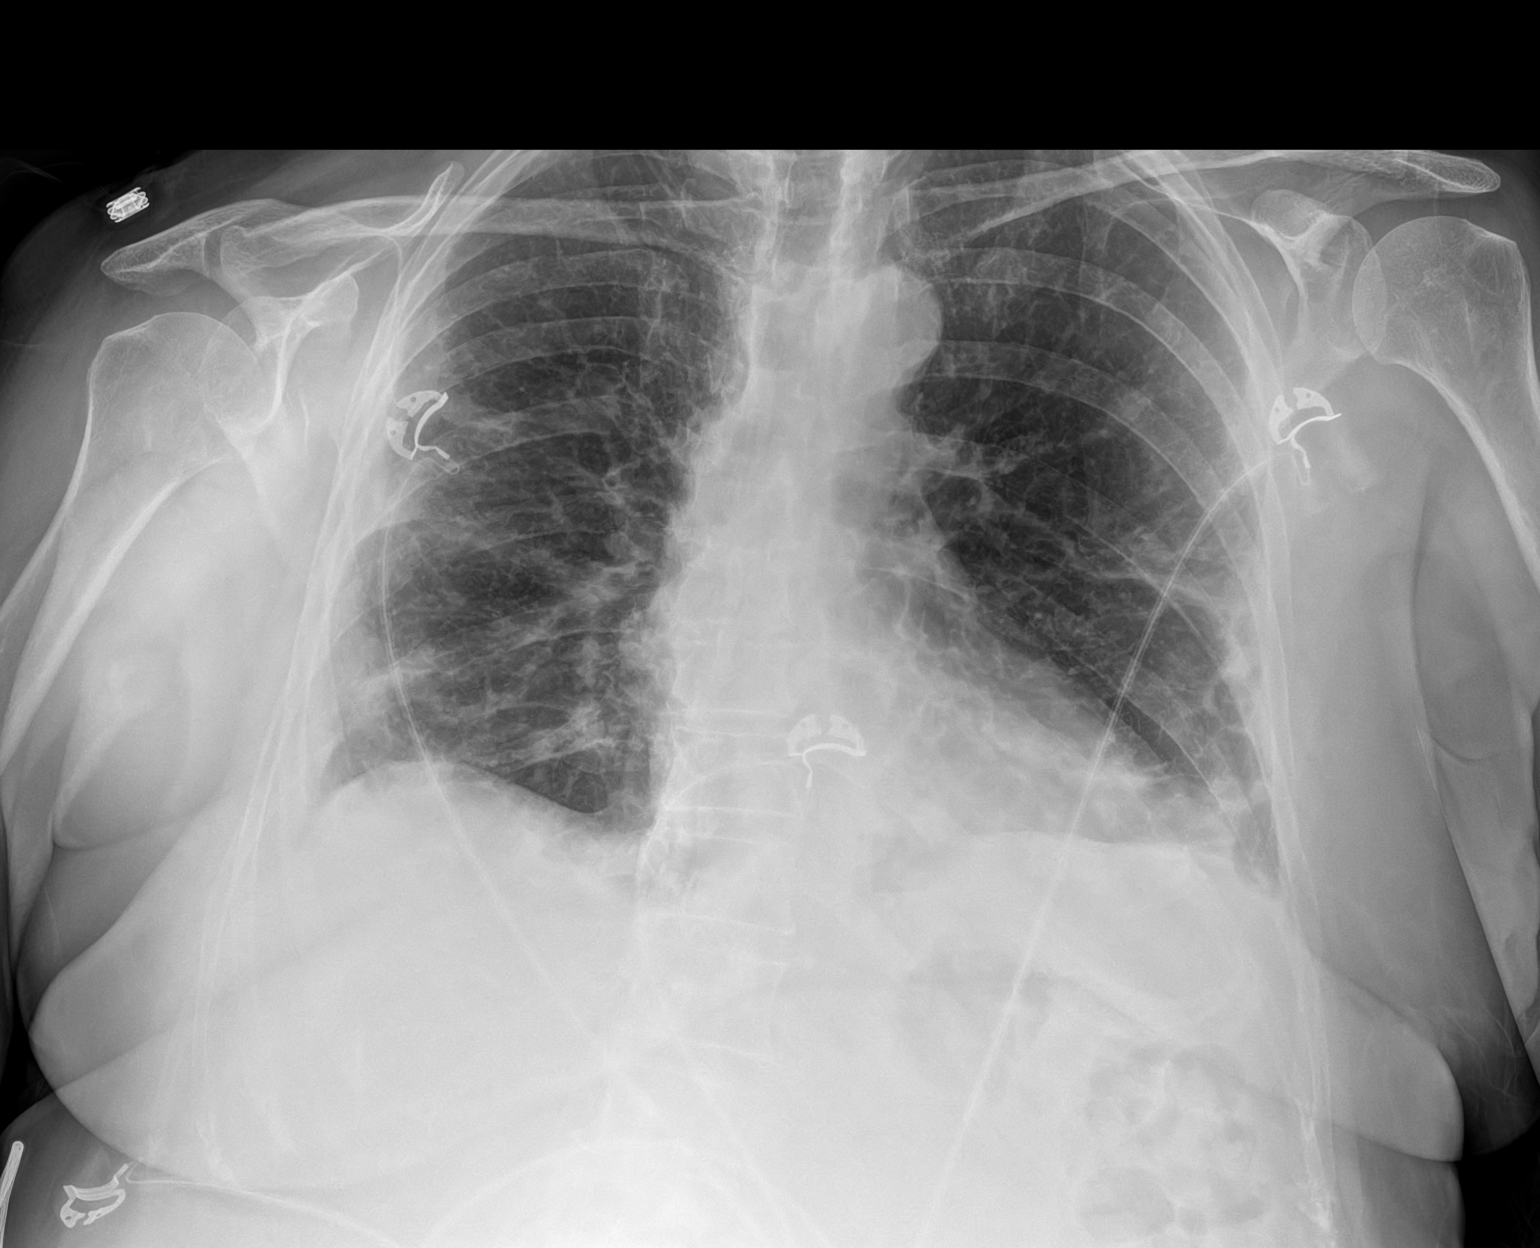

[1 of 1 positions shown; findings below may reference images not displayed]

FINDINGS: Stable heart size. Atherosclerotic calcification of the aortic knob.
Patchy bilateral interstitial opacities without interval progression
from the previous exam. No pleural effusion or pneumothorax.
IMPRESSION: Patchy bilateral interstitial opacities without interval progression
from the previous exam.

## 2023-09-05 ENCOUNTER — Ambulatory Visit (INDEPENDENT_AMBULATORY_CARE_PROVIDER_SITE_OTHER): Admitting: Pulmonary Disease

## 2023-09-05 VITALS — BP 145/76 | HR 61

## 2023-09-05 DIAGNOSIS — U099 Post covid-19 condition, unspecified: Secondary | ICD-10-CM | POA: Diagnosis not present

## 2023-09-05 DIAGNOSIS — R5383 Other fatigue: Secondary | ICD-10-CM

## 2023-09-05 DIAGNOSIS — J4520 Mild intermittent asthma, uncomplicated: Secondary | ICD-10-CM | POA: Diagnosis not present

## 2023-09-05 DIAGNOSIS — J8410 Pulmonary fibrosis, unspecified: Secondary | ICD-10-CM

## 2023-09-05 DIAGNOSIS — J4541 Moderate persistent asthma with (acute) exacerbation: Secondary | ICD-10-CM

## 2023-09-05 MED ORDER — ALBUTEROL SULFATE (2.5 MG/3ML) 0.083% IN NEBU: 2.5000 mg | INHALATION_SOLUTION | Freq: Four times a day (QID) | RESPIRATORY_TRACT | 12 refills | Status: DC | PRN

## 2023-09-05 MED ORDER — AIRSUPRA 90-80 MCG/ACT IN AERO: 2.0000 | INHALATION_SPRAY | Freq: Four times a day (QID) | RESPIRATORY_TRACT | 3 refills | Status: AC | PRN

## 2023-09-05 NOTE — Patient Instructions (Signed)
 VISIT SUMMARY:  Today, we discussed your breathing difficulties and medication management issues. You have been experiencing mild shortness of breath and have had some trouble with your medications due to a change in your pharmacy. We also reviewed your history of coronary artery disease and past heart attack, which was likely related to COVID-19. Your lung function tests show mild asthma, and your cholesterol levels are being managed effectively by your cardiologist.  YOUR PLAN:  -MILD ASTHMA: Mild asthma is a condition where your airways can become inflamed and narrow, causing breathing difficulties. You have been experiencing mild shortness of breath and have had issues with your medications. We will prescribe an Airsupra inhaler, which contains albuterol  and a steroid, to use as a rescue inhaler. Additionally, we will prescribe albuterol  for use with your nebulizer. Continue with aerobic exercises like walking, using a treadmill, or stationary cycling to help manage your symptoms.  -COVID-19: You have residual lung changes, including scar tissue, from your past COVID-19 infection. Your lung function tests are well-managed, and it is possible that COVID-19 contributed to your past heart attack.   INSTRUCTIONS:  Please schedule a follow-up appointment in six months for reassessment and management of your conditions.

## 2023-09-05 NOTE — Progress Notes (Unsigned)
 Monica Harrison    [id number]    1950-07-25  Primary Care Physician:Grabowski, Starling Eck, FNP  Referring Physician: Joette Mustard, FNP [address] [place],  [place] [address]  Chief complaint: Follow-up for bronchitis, post COVID-19  HPI: 73 y.o.  with history of allergies, bronchitis, adrenal insufficiency [treated in [place]], Lyme's disease, angioedema  She has history of recurrent bronchitis, developed MRSA, E. coli pneumonia diagnosed with bronchoscopy in June 2017.  This was treated as an outpatient Has occasional seasonal allergies and is on albuterol  which she uses occasionally. Has occasional snoring, especially when she is fatigued.  She was tested with a sleep apnea around 2015 and was told that she had mild obstructive events only when she was lying supine.  Currently not on treatment with CPAP  Hospitalized for COVID-19 in August of 2022.  Required lengthy treatment with IV steroids, remdesivir  and baricitinib .  She required high flow nasal cannula which was weaned down and was discharged on oral prednisone  taper and 2 L oxygen at night  She is enrolled in pulmonary rehab with some improvement in symptoms.  She still has some hair loss and is wearing a break Had a follow-up CT showing post-COVID changes.  Echocardiogram was also done for evaluation of enlarged PA on CT scan.  There is no evidence of pulmonary hypertension.  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  Underwent cardiac catheterization with stenting of RCA.   In late 2024 she had a prolonged bout of asthmatic bronchitis, exacerbation which required multiple rounds of antibiotics, steroids.    Pets: Dog Occupation: Operates a [employer] Exposures: No mold, hot tub, Jacuzzi.  No feather pillows or comforter Smoking history: Never smoker Travel history: No significant travel history Relevant family history: No significant family history of lung  disease  Interim history: Discussed the use of AI scribe software for clinical note transcription with the patient, who gave verbal consent to proceed.  History of Present Illness Monica Harrison is a 73 year old female with mild asthma and coronary artery disease who presents with breathing difficulties and medication management issues.  She experiences mild dyspnea, describing that she gets 'a little winded' at times. She has been attempting to increase her physical activity by walking around her house and outside, although unfavorable weather has been a barrier. Pollen exposure does not usually affect her unless in high amounts, which can exacerbate her breathing issues.  She has encountered issues with medication management, particularly due to a mail-order pharmacy that disrupted her usual routine, leading to a delay in receiving her nebulizer machine, which she obtained a week ago. She tried using Duoneb but experienced significant side effects, including burning of the tongue and swelling, leading to discontinuation. She has been using an albuterol  inhaler occasionally over the past few weeks, which she finds effective in managing her symptoms, using it twice a day, sometimes once a day.  She has a history of coronary artery disease and had a myocardial infarction in the past, attributed to COVID-19. She is currently on atorvastatin  10 mg twice a week and three times the next week for cholesterol management. She mentions being informed of plaque buildup in her blood vessels.  She attempted to participate in a rehabilitation program at [facility] in [place] but was told she did not need to continue after a few sessions, despite experiencing dyspnea during exercises. She has been doing exercises at  home, including those taught over a year ago, and is trying to maintain her physical activity level.  Unable to afford Breo or Symbicort  due to cost    Outpatient Encounter Medications as of  09/05/2023  Medication Sig   acetaminophen  (TYLENOL ) 325 MG tablet Take 650 mg by mouth every 6 (six) hours as needed.   Acetylcysteine (NAC) 600 MG CAPS Take 1 capsule by mouth in the morning and at bedtime.   albuterol  (VENTOLIN  HFA) 108 (90 Base) MCG/ACT inhaler Inhale 2 puffs into the lungs every 6 (six) hours as needed for wheezing or shortness of breath.   ALPRAZolam  (XANAX ) 0.25 MG tablet Take 0.25 mg by mouth at bedtime.   Ascorbic Acid  (VITAMIN C) 1000 MG tablet Take 2,000 mg by mouth 2 (two) times daily.   atorvastatin  (LIPITOR ) 10 MG tablet Take 10 mg by mouth twice a week and then  alternate with a taking 3 times week by mouth   B Complex Vitamins (B COMPLEX 50 PO) Take 1 tablet by mouth daily.    benzonatate  (TESSALON ) 100 MG capsule Take 1 capsule (100 mg total) by mouth every 6 (six) hours as needed for cough.   BRILINTA  60 MG TABS tablet Take 1 tablet (60 mg total) by mouth 2 (two) times daily.   Calcium -Magnesium-Zinc  (CAL-MAG-ZINC  PO) Take 2 tablets by mouth at bedtime.   Cholecalciferol (VITAMIN D3) 5000 units CAPS Take 5,000 Units by mouth at bedtime.   cholestyramine  (QUESTRAN ) 4 GM/DOSE powder Take 1 packet (4 g total) by mouth 3 (three) times daily with meals.   Coenzyme Q10 (CO Q 10) 100 MG CAPS Take 100 mg by mouth every evening.    diclofenac Sodium (VOLTAREN) 1 % GEL Apply 1 application. topically 4 (four) times daily.   Digestive Enzymes (ENZYME DIGEST) CAPS Take 1 capsule by mouth 2 (two) times daily with a meal.    fluconazole  (DIFLUCAN ) 150 MG tablet Take 150 mg by mouth daily as needed.   hydrocortisone  (CORTEF ) 5 MG tablet Take 5 mg by mouth See admin instructions. TAKE 10MG  IN THE MORNING AND 5 MG IN THE AFTERNOON.   ipratropium-albuterol  (DUONEB) 0.5-2.5 (3) MG/3ML SOLN Take 3 mLs by nebulization every 4 (four) hours as needed.   Magnesium 400 MG TABS Take 400 mg by mouth at bedtime.   metoprolol  tartrate (LOPRESSOR ) 25 MG tablet Take 25 mg tablet  by mouth as  needed for anxiety   Multiple Vitamin (MULTIVITAMIN WITH MINERALS) TABS tablet Take 3 tablets by mouth daily. Pure-[place]   Nebulizers (COMPRESSOR/NEBULIZER) MISC Use as directed   nitroGLYCERIN  (NITROSTAT ) 0.4 MG SL tablet Place 1 tablet (0.4 mg total) under the tongue every 5 (five) minutes as needed.   nystatin cream (MYCOSTATIN) Apply 1 application. topically 2 (two) times daily as needed for rash.   Omega-3 Fatty Acids (OMEGA 3 PO) Take 2 capsules by mouth daily.   Polyethyl Glycol-Propyl Glycol 0.4-0.3 % SOLN Place 1 drop into both eyes 4 (four) times daily.    Probiotic Product (PROBIOTIC PO) Take 1 capsule by mouth every evening.    thyroid  (ARMOUR THYROID ) 90 MG tablet Take 45 mg by mouth in the morning and at bedtime.   vitamin A 10000 UNIT capsule Take 10,000 Units by mouth every evening.    budesonide -formoterol  (SYMBICORT ) 160-4.5 MCG/ACT inhaler Inhale 2 puffs into the lungs in the morning and at bedtime. (Patient not taking: Reported on 09/05/2023)   No facility-administered encounter medications on file as of 09/05/2023.  Physical Exam: Blood pressure (!) 145/76, pulse 61, SpO2 97%. Gen:      No acute distress HEENT:  EOMI, sclera anicteric Neck:     No masses; no thyromegaly Lungs:    Clear to auscultation bilaterally; normal respiratory effort CV:         Regular rate and rhythm; no murmurs Abd:      + bowel sounds; soft, non-tender; no palpable masses, no distension Ext:    No edema; adequate peripheral perfusion Neuro: alert and oriented x 3 Psych: normal mood and affect   Data Reviewed: Imaging: Chest x-ray 09/03/2015- mild atelectasis at the left base.   High-resolution CT 06/21/2020-no evidence of interstitial lung disease, mild centrilobular nodularity, 20 pulmonary reduce measuring 4 mm High-res CT 02/01/2021-basilar predominant peripheral groundglass, linear densities consistent with COVID-19.  Enlarged pulmonary artery Chest x-ray 04/01/2021-improvement in  bilateral airspace disease Chest x-ray 07/22/2021-continued improvement of bilateral opacities. High resolution CT 02/21/2022-improvement in inflammatory changes.  There are residual mild groundglass, subpleural reticulation in the peripheries. High resolution CT 02/25/2023-stable mild interstitial changes in alternate diagnosis. I reviewed the images personally.  PFTs: 09/26/2015 FVC 3.40 [116%], FEV1 2.64 [125%], F/F 78 Normal spirometry  08/31/2021 FVC 2.76 [98%], FEV1 1.86 [88%], F/F 67, TLC 5.05 [103%], DLCO 17.32 [92%] Mild obstructive airway disease with bronchodilator response  Cardiac: Echocardiogram 02/16/2021 LVEF 60-65%, mild LVH, grade 1 diastolic dysfunction.  Normal PA systolic pressure  Sleep: Home sleep study 12/23/2021-no evidence of sleep apnea, no desats at night.  Assessment & Plan Mild asthma Mild asthma with dyspnea and exercise intolerance. Recent medication management issues due to pharmacy changes. Albuterol  inhaler effectively controls symptoms. Duoneb caused tongue burning and swelling, indicating possible allergic reaction or irritation. Lung function tests show mild asthma. Controller medication not needed due to cost and mild asthma.  - Prescribe Airsupra inhaler as a rescue inhaler, containing albuterol  and a steroid.  Not on controller medication due to cost and stability of symptoms - Prescribe albuterol  for nebulizer use. - Encourage aerobic exercise such as walking, treadmill, or stationary cycling.   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  This is new from March 2022 and is consistent with post-COVID changes.  Later CT scan reviewed with stable changes. Off oxygen during daytime.  She can also stop oxygen at night as written sleep study does not show nocturnal desats.  Daytime fatigue No evidence of sleep apnea on home sleep study.  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   Echo reviewed with no evidence of pulmonary hypertension  Plan/Recommendations: Nebulizers Airsupra Exercise therapy  Follow-up in 6 months   Mieczyslaw Stamas MD [facility] 09/05/2023, 11:35 AM  CC: Joette Mustard, FNP

## 2023-10-09 ENCOUNTER — Other Ambulatory Visit: Payer: Self-pay

## 2023-10-09 MED ORDER — ALBUTEROL SULFATE (2.5 MG/3ML) 0.083% IN NEBU: 2.5000 mg | INHALATION_SOLUTION | Freq: Four times a day (QID) | RESPIRATORY_TRACT | 12 refills | Status: AC | PRN

## 2024-02-01 ENCOUNTER — Emergency Department (HOSPITAL_COMMUNITY)
Admission: EM | Admit: 2024-02-01 | Discharge: 2024-02-01 | Disposition: A | Discharge status: Home / Self Care | Attending: Emergency Medicine | Admitting: Emergency Medicine

## 2024-02-01 ENCOUNTER — Other Ambulatory Visit: Payer: Self-pay

## 2024-02-01 ENCOUNTER — Emergency Department (HOSPITAL_COMMUNITY)

## 2024-02-01 DIAGNOSIS — J441 Chronic obstructive pulmonary disease with (acute) exacerbation: Secondary | ICD-10-CM | POA: Insufficient documentation

## 2024-02-01 DIAGNOSIS — Z7951 Long term (current) use of inhaled steroids: Secondary | ICD-10-CM | POA: Insufficient documentation

## 2024-02-01 DIAGNOSIS — R0602 Shortness of breath: Secondary | ICD-10-CM | POA: Diagnosis present

## 2024-02-01 LAB — CBC
HCT: 42 % (ref 36.0–46.0)
Hemoglobin: 14.2 g/dL (ref 12.0–15.0)
MCH: 30.7 pg (ref 26.0–34.0)
MCHC: 33.8 g/dL (ref 30.0–36.0)
MCV: 90.9 fL (ref 80.0–100.0)
Platelets: 256 K/uL (ref 150–400)
RBC: 4.62 MIL/uL (ref 3.87–5.11)
RDW: 12.9 % (ref 11.5–15.5)
WBC: 8.2 K/uL (ref 4.0–10.5)
nRBC: 0 % (ref 0.0–0.2)

## 2024-02-01 LAB — BASIC METABOLIC PANEL WITH GFR
Anion gap: 11 (ref 5–15)
BUN: 15 mg/dL (ref 8–23)
CO2: 24 mmol/L (ref 22–32)
Calcium: 9.5 mg/dL (ref 8.9–10.3)
Chloride: 105 mmol/L (ref 98–111)
Creatinine, Ser: 0.68 mg/dL (ref 0.44–1.00)
GFR, Estimated: >60 mL/min (ref >60)
Glucose, Bld: 108 mg/dL — ABNORMAL HIGH (ref 70–99)
Potassium: 3.8 mmol/L (ref 3.5–5.1)
Sodium: 141 mmol/L (ref 135–145)

## 2024-02-01 MED ORDER — ALBUTEROL SULFATE (2.5 MG/3ML) 0.083% IN NEBU
INHALATION_SOLUTION | RESPIRATORY_TRACT | Status: AC
  Administered 2024-02-01: 2.5 mg via RESPIRATORY_TRACT
  Filled 2024-02-01: qty 3

## 2024-02-01 MED ORDER — METHYLPREDNISOLONE SODIUM SUCC 125 MG IJ SOLR
125.0000 mg | Freq: Once | INTRAMUSCULAR | Status: AC
  Administered 2024-02-01: 125 mg via INTRAMUSCULAR
  Filled 2024-02-01: qty 2

## 2024-02-01 MED ORDER — IPRATROPIUM-ALBUTEROL 0.5-2.5 (3) MG/3ML IN SOLN
3.0000 mL | Freq: Once | RESPIRATORY_TRACT | Status: DC
  Filled 2024-02-01: qty 3

## 2024-02-01 MED ORDER — ALBUTEROL SULFATE (2.5 MG/3ML) 0.083% IN NEBU
2.5000 mg | INHALATION_SOLUTION | Freq: Once | RESPIRATORY_TRACT | Status: AC
  Administered 2024-02-01: 2.5 mg via RESPIRATORY_TRACT
  Filled 2024-02-01: qty 3

## 2024-02-01 MED ORDER — ALBUTEROL SULFATE (2.5 MG/3ML) 0.083% IN NEBU: 2.5000 mg | INHALATION_SOLUTION | Freq: Once | RESPIRATORY_TRACT | Status: AC

## 2024-02-01 NOTE — Discharge Instructions (Signed)
 Continue taking the antibiotic and the prednisone  and using your inhaler.  Follow-up with your family doctor after you finish the antibiotic

## 2024-02-01 NOTE — ED Provider Notes (Signed)
 [place] EMERGENCY DEPARTMENT AT [facility] Provider Note   CSN: [id number] Arrival date & time: 02/01/24  9176     Patient presents with: Shortness of Breath   Monica Harrison is a 73 y.o. female.  {Add pertinent medical, surgical, social history, OB history to YEP:67052} Patient has a history of COPD.  She comes in complaining of cough and shortness of breath   Shortness of Breath      Prior to Admission medications   Medication Sig Start Date End Date Taking? Authorizing Provider  acetaminophen  (TYLENOL ) 325 MG tablet Take 650 mg by mouth every 6 (six) hours as needed.    [provider]  Acetylcysteine (NAC) 600 MG CAPS Take 1 capsule by mouth in the morning and at bedtime.    [provider]  albuterol  (PROVENTIL ) (2.5 MG/3ML) 0.083% nebulizer solution Take 3 mLs (2.5 mg total) by nebulization every 6 (six) hours as needed for wheezing or shortness of breath. 10/09/23   Mannam, Praveen, MD  Albuterol -Budesonide  (AIRSUPRA ) 90-80 MCG/ACT AERO Inhale 2 puffs into the lungs every 6 (six) hours as needed. 09/05/23 09/04/24  Mannam, Praveen, MD  ALPRAZolam  (XANAX ) 0.25 MG tablet Take 0.25 mg by mouth at bedtime.    [provider]  Ascorbic Acid  (VITAMIN C) 1000 MG tablet Take 2,000 mg by mouth 2 (two) times daily.    [provider]  atorvastatin  (LIPITOR ) 10 MG tablet Take 10 mg by mouth twice a week and then  alternate with a taking 3 times week by mouth 07/23/23   Anner Alm ORN, MD  B Complex Vitamins (B COMPLEX 50 PO) Take 1 tablet by mouth daily.     [provider]  benzonatate  (TESSALON ) 100 MG capsule Take 1 capsule (100 mg total) by mouth every 6 (six) hours as needed for cough. 07/09/23   Mannam, Praveen, MD  BRILINTA  60 MG TABS tablet Take 1 tablet (60 mg total) by mouth 2 (two) times daily. 12/04/22   Anner Alm ORN, MD  budesonide -formoterol  (SYMBICORT ) 160-4.5 MCG/ACT inhaler Inhale 2 puffs into the lungs in the  morning and at bedtime. Patient not taking: Reported on 09/05/2023 04/30/23   Mannam, Praveen, MD  Calcium -Magnesium-Zinc  (CAL-MAG-ZINC  PO) Take 2 tablets by mouth at bedtime.    [provider]  Cholecalciferol (VITAMIN D3) 5000 units CAPS Take 5,000 Units by mouth at bedtime.    [provider]  cholestyramine  (QUESTRAN ) 4 GM/DOSE powder Take 1 packet (4 g total) by mouth 3 (three) times daily with meals. 07/23/23   Anner Alm ORN, MD  Coenzyme Q10 (CO Q 10) 100 MG CAPS Take 100 mg by mouth every evening.     [provider]  diclofenac Sodium (VOLTAREN) 1 % GEL Apply 1 application. topically 4 (four) times daily. 09/07/21   [provider]  Digestive Enzymes (ENZYME DIGEST) CAPS Take 1 capsule by mouth 2 (two) times daily with a meal.     [provider]  fluconazole  (DIFLUCAN ) 150 MG tablet Take 150 mg by mouth daily as needed. 09/24/22   [provider]  hydrocortisone  (CORTEF ) 5 MG tablet Take 5 mg by mouth See admin instructions. TAKE 10MG  IN THE MORNING AND 5 MG IN THE AFTERNOON.    [provider]  Magnesium 400 MG TABS Take 400 mg by mouth at bedtime.    [provider]  metoprolol  tartrate (LOPRESSOR ) 25 MG tablet Take 25 mg tablet  by mouth as needed for anxiety 10/23/22  Anner Alm ORN, MD  Multiple Vitamin (MULTIVITAMIN WITH MINERALS) TABS tablet Take 3 tablets by mouth daily. Pure-[place]    [provider]  Nebulizers (COMPRESSOR/NEBULIZER) MISC Use as directed 04/30/23   Mannam, Praveen, MD  nitroGLYCERIN  (NITROSTAT ) 0.4 MG SL tablet Place 1 tablet (0.4 mg total) under the tongue every 5 (five) minutes as needed. 07/23/23   Anner Alm ORN, MD  nystatin cream (MYCOSTATIN) Apply 1 application. topically 2 (two) times daily as needed for rash. 03/06/21   [provider]  Omega-3 Fatty Acids (OMEGA 3 PO) Take 2 capsules by mouth daily.    [provider]  Polyethyl Glycol-Propyl Glycol 0.4-0.3 %  SOLN Place 1 drop into both eyes 4 (four) times daily.     [provider]  Probiotic Product (PROBIOTIC PO) Take 1 capsule by mouth every evening.     [provider]  thyroid  (ARMOUR THYROID ) 90 MG tablet Take 45 mg by mouth in the morning and at bedtime.    [provider]  vitamin A 10000 UNIT capsule Take 10,000 Units by mouth every evening.     [provider]    Allergies: Avelox [moxifloxacin hcl in nacl], Biaxin [clarithromycin], Lipitor  [atorvastatin ], Lactose intolerance (gi), Tobramycin, and Yeast-derived drug products    Review of Systems  Respiratory:  Positive for shortness of breath.     Updated Vital Signs BP 131/66   Pulse 91   Temp 98.4 F (36.9 C) (Oral)   Resp 17   Ht 5' 3 (1.6 m)   Wt 81.6 kg   SpO2 94%   BMI 31.89 kg/m   Physical Exam  (all labs ordered are listed, but only abnormal results are displayed) Labs Reviewed  BASIC METABOLIC PANEL WITH GFR - Abnormal; Notable for the following components:      Result Value   Glucose, Bld 108 (*)    All other components within normal limits  CBC    EKG: None  Radiology: DG Chest 2 View Result Date: 02/01/2024 EXAM: 2 VIEW(S) XRAY OF THE CHEST 02/01/2024 09:24:07 AM COMPARISON: AP radiograph of the chest dated 09/08/2022. CLINICAL HISTORY: SOB. Per Triage: Pt had strep throat and went to a Dr and received medication. Pt now c/o SOB, nasal congestion, and some rib pain from coughing. Pt cough is productive and yellow. FINDINGS: LUNGS AND PLEURA: Coarsened interstitial markings. No pulmonary edema. No pleural effusion. No pneumothorax. HEART AND MEDIASTINUM: No acute abnormality of the cardiac and mediastinal silhouettes. BONES AND SOFT TISSUES: No acute osseous abnormality. IMPRESSION: 1. Coarsened interstitial markings without pulmonary edema. Electronically signed by: Evalene Coho MD 02/01/2024 09:40 AM EDT RP Workstation: [id number]    {Document cardiac monitor,  telemetry assessment procedure when appropriate:32947} Procedures   Medications Ordered in the ED  ipratropium-albuterol  (DUONEB) 0.5-2.5 (3) MG/3ML nebulizer solution 3 mL (3 mLs Nebulization Not Given 02/01/24 1208)  albuterol  (PROVENTIL ) (2.5 MG/3ML) 0.083% nebulizer solution 2.5 mg (2.5 mg Nebulization Given 02/01/24 1209)  albuterol  (PROVENTIL ) (2.5 MG/3ML) 0.083% nebulizer solution 2.5 mg (2.5 mg Nebulization Given 02/01/24 1318)  methylPREDNISolone  sodium succinate (SOLU-MEDROL ) 125 mg/2 mL injection 125 mg (125 mg Intramuscular Given 02/01/24 1300)      {Click here for ABCD2, HEART and other calculators REFRESH Note before signing:1}                              Medical Decision Making Amount and/or Complexity of Data Reviewed Labs: ordered. Radiology: ordered.  Risk Prescription drug management.  Patient with bronchitis and bronchospasm.  {Document critical care time when appropriate  Document review of labs and clinical decision tools ie CHADS2VASC2, etc  Document your independent review of radiology images and any outside records  Document your discussion with family members, caretakers and with consultants  Document social determinants of health affecting pt's care  Document your decision making why or why not admission, treatments were needed:32947:::1}   Final diagnoses:  COPD exacerbation [facility])    ED Discharge Orders     None

## 2024-02-01 NOTE — ED Triage Notes (Signed)
 Pt had strep throat and went to a Dr and received medication. Pt now c/o SOB, nasal congestion, and some rib pain from coughing. Pt cough is productive and yellow.

## 2024-02-11 ENCOUNTER — Ambulatory Visit: Payer: Self-pay | Admitting: Pulmonary Disease

## 2024-02-11 NOTE — Telephone Encounter (Signed)
 FYI Only or Action Required?: Action required by provider: clinical question for provider and medication requested.  Patient is followed in Pulmonology for ILD, last seen on 09/05/2023 by Mannam, Praveen, MD.  Called Nurse Triage reporting Cough.  Symptoms began several weeks ago.  Interventions attempted: Prescription medications: augmentin  and prednisone  and Nebulizer treatments.  Symptoms are: unchanged.  Triage Disposition: See PCP When Office is Open (Within 3 Days)  Patient/caregiver understands and will follow disposition?: Yes  Pt not scheduled. She requested an appt 11/10 when she is here for another appt or medications to be sent.    Copied from CRM [id number]. Topic: Clinical - Red Word Triage >> Feb 11, 2024 10:02 AM Lavanda D wrote: Red Word that prompted transfer to Nurse Triage: Increased mucous. Patient had strep on 10/15 + treated by pcp, antibiotics + prednisone  which helped. 10/19 patient was in the ER because she felt like she couldn't breathe and was told she still has some infection in her lungs and to follow-up with pulm. Feeling a lot better but still coughing up phlegm. Patient said that she does not have any trouble breathing and that the phlegm is clear and some yellow at times. Reason for Disposition  [1] Nasal discharge AND [2] present > 10 days  Answer Assessment - Initial Assessment Questions Pt states that on 10/14 throat dry and swollen, she saw her pcp and tested positive for strep. She gave her  atbx x10 days  and 20mg  prednisone  daily x 10 days. Last week she starte to have a cough. She states Dr. Theophilus had told her before when she gets sick she can do the albuterol  treatments 3 ties a day. She stats that she has been and doing saline rinses of her nose 2 days a day. She states 10/19 she ended up in the Er because she felt like she couldn't breath. She states the ER doc offered her ativan but she refused it. She states he also suggested ipatropium but that  the last time Dr. Theophilus tried to give that to her it made her swell up. She states she was having a ton of mucus and was having shortness of breath so she changed her prednisone  herself to 20mg  BID for the last two days and she said she felt like she was finally human. She states that she saw her PCP yesterday and told her what she did and requested 7 days of 20mg  prednisone  BID. She states that her PCP did not agree to that because of her adrenal insufficiency but said she could do a dose pack. She states that the dose packs don't work for her. She is requesting that Dr. Theophilus send her in the prednisone  20mg  BID x7 days. She states it worked last year when she had something similar to this. States last year she also required a shot of solumedrol She states she is still coughing up pleghm, blowing pale yellow out of her nose and her ears started to pop and clearing up when she took the increased dose.    1. RESPIRATORY STATUS: Describe your breathing? (e.g., wheezing, shortness of breath, unable to speak, severe coughing)      Shortness of breath is better now.  2. ONSET: When did this breathing problem begin?       About 01/26/24 3. PATTERN Does the difficult breathing come and go, or has it been constant since it started?      Intermittent.  4. SEVERITY: How bad is your breathing? (e.g., mild,  moderate, severe)      Mod to severe prior to Er  7. LUNG HISTORY: Do you have any history of lung disease?  (e.g., pulmonary embolus, asthma, emphysema)     ILD 8. CAUSE: What do you think is causing the breathing problem?      This infection she has 9. OTHER SYMPTOMS: Do you have any other symptoms? (e.g., chest pain, cough, dizziness, fever, runny nose)     Cough, blowing her nose, congested  Answer Assessment - Initial Assessment Questions Pt states that on 10/14 throat dry and swollen, she saw her pcp and tested positive for strep. She gave her  atbx x10 days  and 20mg  prednisone   daily x 10 days. Last week she starte to have a cough. She states Dr. Theophilus had told her before when she gets sick she can do the albuterol  treatments 3 ties a day. She stats that she has been and doing saline rinses of her nose 2 days a day. She states 10/19 she ended up in the Er because she felt like she couldn't breath. She states the ER doc offered her ativan but she refused it. She states he also suggested ipatropium but that the last time Dr. Theophilus tried to give that to her it made her swell up. She states she was having a ton of mucus and was having shortness of breath so she changed her prednisone  herself to 20mg  BID for the last two days and she said she felt like she was finally human. She states that she saw her PCP yesterday and told her what she did and requested 7 days of 20mg  prednisone  BID. She states that her PCP did not agree to that because of her adrenal insufficiency but said she could do a dose pack. She states that the dose packs don't work for her. She is requesting that Dr. Theophilus send her in the prednisone  20mg  BID x7 days. She states it worked last year when she had something similar to this. States last year she also required a shot of solumedrol She states she is still coughing up pleghm, blowing pale yellow out of her nose and her ears started to pop and clearing up when she took the increased dose.      1. ONSET: When did the cough begin?      01/26/24 2. SEVERITY: How bad is the cough today?      Mod to severe 3. SPUTUM: Describe the color of your sputum (e.g., none, dry cough; clear, white, yellow, green)     Pale yellow 4. HEMOPTYSIS: Are you coughing up any blood? If Yes, ask: How much? (e.g., flecks, streaks, tablespoons, etc.)     no 5. DIFFICULTY BREATHING: Are you having difficulty breathing? If Yes, ask: How bad is it? (e.g., mild, moderate, severe)      Not with increased albuterol  and when she increased prednisone  6. FEVER: Do you have a  fever? If Yes, ask: What is your temperature, how was it measured, and when did it start?     no  8. LUNG HISTORY: Do you have any history of lung disease?  (e.g., pulmonary embolus, asthma, emphysema)     ILD  10. OTHER SYMPTOMS: Do you have any other symptoms? (e.g., runny nose, wheezing, chest pain)       Blowing nose, congestion  Protocols used: Breathing Difficulty-A-AH, Cough - Acute Productive-A-AH

## 2024-02-11 NOTE — Telephone Encounter (Signed)
 Dr. Theophilus, I offered patient an appointment at [facility], however she requested to be seen at the [facility] office on 10/31 if at all possible.  I advised her we did not have any availability at our office on that day.  I did schedule her a f/u with you on 11/11 at 3 pm as she will be in town already for another appointment in the morning.   Patient requesting prednisone  at this time.  Please advise.  Thank you.

## 2024-02-12 ENCOUNTER — Other Ambulatory Visit: Payer: Self-pay

## 2024-02-12 ENCOUNTER — Telehealth: Payer: Self-pay

## 2024-02-12 MED ORDER — PREDNISONE 10 MG PO TABS: ORAL_TABLET | ORAL | 0 refills | Status: AC

## 2024-02-12 MED ORDER — PREDNISONE 10 MG PO TABS
40.0000 mg | ORAL_TABLET | Freq: Every day | ORAL | 0 refills | Status: DC
Start: 2024-02-12 — End: 2024-02-12

## 2024-02-12 NOTE — Telephone Encounter (Signed)
 I called and discussed with patient. Pred prescription sent. Nothing further needed.

## 2024-02-12 NOTE — Telephone Encounter (Signed)
 It is a same dose as I had sent in a prescription for 40 mg a day for 5 days which is the same as 20 mg twice daily  If this is causing confusion then please send in a prescription as described by the pharmacy- 20 MG of prednisone  2 times daily for 5 days, and after, 20 MG 1 time a day for 5 days.

## 2024-02-12 NOTE — Telephone Encounter (Unsigned)
 Copied from CRM #[id number]. Topic: Clinical - Prescription Issue >> Feb 12, 2024 10:26 AM Corean SAUNDERS wrote: Reason for CRM: Patient states she spoke with Dr. Theophilus this morning and they both agreed that per her adrenal insufficiency and her strep throat occurring for over 17 days that she would immediatly start taking prednisone  as follows:  20 MG of prednisone  2 times daily for 5 days, and after, 20 MG 1 time a day for 5 days.   Patient was advised by her pharmacy that prescription was sent in completely different than the above dosage and is very worried because Dr. Theophilus wanted her to start taking the medication immediatly.   Patient is requesting a call back when this is resolved so that she may pick up her prescription as soon as possible.

## 2024-02-18 NOTE — Telephone Encounter (Signed)
 Patient is aware of how to take medication. NFN

## 2024-02-24 ENCOUNTER — Ambulatory Visit: Admitting: Pulmonary Disease

## 2024-02-24 ENCOUNTER — Encounter: Payer: Self-pay | Admitting: Pulmonary Disease

## 2024-02-24 VITALS — BP 132/72 | HR 77 | Temp 98.8°F | Ht 63.0 in | Wt 176.0 lb

## 2024-02-24 DIAGNOSIS — J4541 Moderate persistent asthma with (acute) exacerbation: Secondary | ICD-10-CM

## 2024-02-24 DIAGNOSIS — E2740 Unspecified adrenocortical insufficiency: Secondary | ICD-10-CM | POA: Diagnosis not present

## 2024-02-24 MED ORDER — BENZONATATE 200 MG PO CAPS: 200.0000 mg | ORAL_CAPSULE | Freq: Three times a day (TID) | ORAL | 11 refills | Status: AC | PRN

## 2024-02-24 NOTE — Progress Notes (Signed)
 Monica Harrison    [id number]    March 27, 1951  Primary Care Physician:Grabowski, Josette, FNP  Referring Physician: Erskine Josette, FNP [address] [place],  [place] [id number]  Chief complaint: Follow-up for bronchitis, post COVID-19  HPI: 73 y.o.  with history of allergies, bronchitis, adrenal insufficiency [treated in [place]], Lyme's disease, angioedema  She has history of recurrent bronchitis, developed MRSA, E. coli pneumonia diagnosed with bronchoscopy in June 2017.  This was treated as an outpatient Has occasional seasonal allergies and is on albuterol  which she uses occasionally. Has occasional snoring, especially when she is fatigued.  She was tested with a sleep apnea around 2015 and was told that she had mild obstructive events only when she was lying supine.  Currently not on treatment with CPAP  Hospitalized for COVID-19 in August of 2022.  Required lengthy treatment with IV steroids, remdesivir  and baricitinib .  She required high flow nasal cannula which was weaned down and was discharged on oral prednisone  taper and 2 L oxygen at night  She is enrolled in pulmonary rehab with some improvement in symptoms.  She still has some hair loss and is wearing a break Had a follow-up CT showing post-COVID changes.  Echocardiogram was also done for evaluation of enlarged PA on CT scan.  There is no evidence of pulmonary hypertension.  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  Underwent cardiac catheterization with stenting of RCA.   In late 2024 she had a prolonged bout of asthmatic bronchitis, exacerbation which required multiple rounds of antibiotics, steroids.  Unable to afford Breo or Symbicort  due to cost and is just on as needed medications  Interim history: Discussed the use of AI scribe software for clinical note transcription with the patient, who gave verbal consent to proceed. History of Present Illness  Monica Harrison is a 73 year old female with adrenal insufficiency who presents with persistent asthma exacerbation following a recent strep throat infection.  Asthma exacerbation and respiratory symptoms - Persistent asthma exacerbation following strep throat infection diagnosed on January 26, 2024 - Initial treatment with prednisone  20 mg daily for ten days was ineffective - Required increased prednisone  dosage to 20 mg twice daily for seven days, extended to eight days due to ongoing symptoms - Significant respiratory symptoms include dyspnea and excessive mucus production - Emergency visit on February 01, 2024 with chest x-ray performed - No fever or other systemic symptoms - Albuterol  administered via nebulizer three times daily and inhaler, both effective in symptom management - Budesonide  nasal spray used occasionally for nasal congestion  Recent streptococcal pharyngitis - Strep throat infection diagnosed on January 26, 2024 - Treated with Augmentin  for ten days  Adrenal insufficiency - Hydrocortisone  25 mg in the morning and 5 mg in the afternoon for adrenal insufficiency - Prolonged recovery from illnesses attributed to adrenal insufficiency   Relevant pulmonary history Pets: Dog Occupation: Operates a administrator, 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  No feather pillows or comforter Smoking history: Never smoker Travel history: No significant travel history Relevant family history: No significant family history of lung disease  Outpatient Encounter Medications as of 02/24/2024  Medication Sig   acetaminophen  (TYLENOL ) 325 MG tablet Take 650 mg by mouth every 6 (six) hours as needed.   Acetylcysteine (NAC) 600 MG CAPS Take 1 capsule by mouth in the morning and at bedtime.   albuterol  (PROVENTIL ) (2.5 MG/3ML) 0.083% nebulizer  solution Take 3 mLs (2.5 mg total) by nebulization every 6 (six) hours as needed for wheezing or shortness of breath.   Albuterol -Budesonide  (AIRSUPRA )  90-80 MCG/ACT AERO Inhale 2 puffs into the lungs every 6 (six) hours as needed.   ALPRAZolam  (XANAX ) 0.25 MG tablet Take 0.25 mg by mouth at bedtime.   Ascorbic Acid  (VITAMIN C) 1000 MG tablet Take 2,000 mg by mouth 2 (two) times daily.   atorvastatin  (LIPITOR ) 10 MG tablet Take 10 mg by mouth twice a week and then  alternate with a taking 3 times week by mouth   B Complex Vitamins (B COMPLEX 50 PO) Take 1 tablet by mouth daily.    benzonatate  (TESSALON ) 100 MG capsule Take 1 capsule (100 mg total) by mouth every 6 (six) hours as needed for cough.   BRILINTA  60 MG TABS tablet Take 1 tablet (60 mg total) by mouth 2 (two) times daily.   budesonide -formoterol  (SYMBICORT ) 160-4.5 MCG/ACT inhaler Inhale 2 puffs into the lungs in the morning and at bedtime.   Calcium -Magnesium-Zinc  (CAL-MAG-ZINC  PO) Take 2 tablets by mouth at bedtime.   Cholecalciferol (VITAMIN D3) 5000 units CAPS Take 5,000 Units by mouth at bedtime.   cholestyramine  (QUESTRAN ) 4 GM/DOSE powder Take 1 packet (4 g total) by mouth 3 (three) times daily with meals.   Coenzyme Q10 (CO Q 10) 100 MG CAPS Take 100 mg by mouth every evening.    diclofenac Sodium (VOLTAREN) 1 % GEL Apply 1 application. topically 4 (four) times daily.   Digestive Enzymes (ENZYME DIGEST) CAPS Take 1 capsule by mouth 2 (two) times daily with a meal.    fluconazole  (DIFLUCAN ) 150 MG tablet Take 150 mg by mouth daily as needed.   hydrocortisone  (CORTEF ) 5 MG tablet Take 5 mg by mouth See admin instructions. TAKE 10MG  IN THE MORNING AND 5 MG IN THE AFTERNOON.   Magnesium 400 MG TABS Take 400 mg by mouth at bedtime.   metoprolol  tartrate (LOPRESSOR ) 25 MG tablet Take 25 mg tablet  by mouth as needed for anxiety   Multiple Vitamin (MULTIVITAMIN WITH MINERALS) TABS tablet Take 3 tablets by mouth daily. Pure-Elloree   Nebulizers (COMPRESSOR/NEBULIZER) MISC Use as directed   nitroGLYCERIN  (NITROSTAT ) 0.4 MG SL tablet Place 1 tablet (0.4 mg total) under the tongue every 5  (five) minutes as needed.   nystatin cream (MYCOSTATIN) Apply 1 application. topically 2 (two) times daily as needed for rash.   Omega-3 Fatty Acids (OMEGA 3 PO) Take 2 capsules by mouth daily.   Polyethyl Glycol-Propyl Glycol 0.4-0.3 % SOLN Place 1 drop into both eyes 4 (four) times daily.    predniSONE  (DELTASONE ) 10 MG tablet 4 tabs for 10 days And then take 2 tabs (20mg  total) daily with breakfast for 5 days and then stop.   Probiotic Product (PROBIOTIC PO) Take 1 capsule by mouth every evening.    thyroid  (ARMOUR THYROID ) 90 MG tablet Take 45 mg by mouth in the morning and at bedtime.   vitamin A 10000 UNIT capsule Take 10,000 Units by mouth every evening.    No facility-administered encounter medications on file as of 02/24/2024.   Vitals:   02/24/24 1457  BP: 132/72  Pulse: 77  Temp: 98.8 F (37.1 C)  Height: 5' 3 (1.6 m)  Weight: 176 lb (79.8 kg)  SpO2: 94%  TempSrc: Oral  BMI (Calculated): 31.18     Physical Exam GEN: No acute distress. CV: Regular rate and rhythm, no murmurs. LUNGS: Clear to auscultation bilaterally,  normal respiratory effort. SKIN JOINTS: Warm and dry, no rash.    Data Reviewed: Imaging: Chest x-ray 09/03/2015- mild atelectasis at the left base.   High-resolution CT 06/21/2020-no evidence of interstitial lung disease, mild centrilobular nodularity, 20 pulmonary reduce measuring 4 mm High-res CT 02/01/2021-basilar predominant peripheral groundglass, linear densities consistent with COVID-19.  Enlarged pulmonary artery Chest x-ray 04/01/2021-improvement in bilateral airspace disease Chest x-ray 07/22/2021-continued improvement of bilateral opacities. High resolution CT 02/21/2022-improvement in inflammatory changes.  There are residual mild groundglass, subpleural reticulation in the peripheries. High resolution CT 02/25/2023-stable mild interstitial changes in alternate diagnosis. Chest x-ray 02/01/2024-coarsened interstitial markings without infiltrate  or edema. I reviewed the images personally.  PFTs: 09/26/2015 FVC 3.40 [116%], FEV1 2.64 [125%], F/F 78 Normal spirometry  08/31/2021 FVC 2.76 [98%], FEV1 1.86 [88%], F/F 67, TLC 5.05 [103%], DLCO 17.32 [92%] Mild obstructive airway disease with bronchodilator response  Cardiac: Echocardiogram 02/16/2021 LVEF 60-65%, mild LVH, grade 1 diastolic dysfunction.  Normal PA systolic pressure  Sleep: Home sleep study 12/23/2021-no evidence of sleep apnea, no desats at night. Assessment & Plan Asthma with recent exacerbation Recent exacerbation likely triggered by acute streptococcal pharyngitis. Initial treatment with prednisone  and antibiotics was insufficient, leading to prolonged symptoms. Current symptoms have improved significantly with prednisone  and albuterol . Arsupra, a combination inhaler with albuterol  and a steroid, is recommended as a more effective rescue inhaler. - Discontinued prednisone  as symptoms have improved. - Use Arsupra as a rescue inhaler instead of albuterol . - Continue using nebulizer as needed. - Prescribed Tessalon  200 mg for cough management.  Adrenal insufficiency on chronic hydrocortisone  Chronic adrenal insufficiency managed with low-dose hydrocortisone . Temporary discontinuation of hydrocortisone  during prednisone  treatment for asthma exacerbation. - Resume hydrocortisone   Acute streptococcal pharyngitis, now resolved Acute streptococcal pharyngitis treated with Augmentin  and prednisone . Symptoms have resolved, and she reports significant improvement.   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  This is new from March 2022 and is consistent with post-COVID changes.  Later CT scan reviewed with stable changes. Off oxygen during daytime.  She can also stop oxygen at night as written sleep study does not show nocturnal desats.  Plan/Recommendations: Nebulizers Airsupra  Exercise  therapy  Follow-up in 6 months  I personally spent a total of 30 minutes in the care of the patient today including preparing to see the patient, getting/reviewing separately obtained history, performing a medically appropriate exam/evaluation, counseling and educating, independently interpreting results, communicating results, and coordinating care.   Lonna Coder MD [facility] 02/24/2024, 3:06 PM  CC: Erskine Neptune, FNP

## 2024-02-24 NOTE — Patient Instructions (Signed)
  VISIT SUMMARY: Today, we addressed your persistent asthma exacerbation following a recent strep throat infection. We also reviewed your adrenal insufficiency management and confirmed the resolution of your strep throat.  YOUR PLAN: ASTHMA WITH RECENT EXACERBATION: Your recent asthma flare-up was likely triggered by your strep throat infection. Your symptoms have improved with prednisone  and albuterol . -Stop taking prednisone  as your symptoms have improved. -Use Arsupra as your rescue inhaler instead of albuterol . -Continue using the nebulizer as needed. -Take Tessalon  200 mg for cough management.  ADRENAL INSUFFICIENCY: You have chronic adrenal insufficiency managed with hydrocortisone . You temporarily stopped hydrocortisone  while taking prednisone  for your asthma. -Resume taking hydrocortisone   ACUTE STREPTOCOCCAL PHARYNGITIS: Your strep throat infection has been treated and your symptoms have resolved. -No further treatment is needed for strep throat as it has resolved.

## 2024-02-24 NOTE — Progress Notes (Signed)
 Monica Harrison    [id number]    January 12, 1951  Primary Care Physician:Grabowski, Josette, FNP  Referring Physician: Erskine Josette, FNP [address] [place],  [place] [id number]  Chief complaint: Follow-up for bronchitis, post COVID-19  HPI: 73 y.o.  with history of allergies, bronchitis, adrenal insufficiency [treated in [place]], Lyme's disease, angioedema  She has history of recurrent bronchitis, developed MRSA, E. coli pneumonia diagnosed with bronchoscopy in June 2017.  This was treated as an outpatient Has occasional seasonal allergies and is on albuterol  which she uses occasionally. Has occasional snoring, especially when she is fatigued.  She was tested with a sleep apnea around 2015 and was told that she had mild obstructive events only when she was lying supine.  Currently not on treatment with CPAP  Hospitalized for COVID-19 in August of 2022.  Required lengthy treatment with IV steroids, remdesivir  and baricitinib .  She required high flow nasal cannula which was weaned down and was discharged on oral prednisone  taper and 2 L oxygen at night  She is enrolled in pulmonary rehab with some improvement in symptoms.  She still has some hair loss and is wearing a break Had a follow-up CT showing post-COVID changes which has remained stable.  Echocardiogram was also done for evaluation of enlarged PA on CT scan.  There is no evidence of pulmonary hypertension.  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  Underwent cardiac catheterization with stenting of RCA.   In late 2024 she had a prolonged bout of asthmatic bronchitis, exacerbation which required multiple rounds of antibiotics, steroids.      Interim history: Discussed the use of AI scribe software for clinical note transcription with the patient, who gave verbal consent to proceed.  History of Present Illness Monica Harrison is a 73 year old female with mild asthma and  coronary artery disease who presents with breathing difficulties and medication management issues.  She experiences mild dyspnea, describing that she gets 'a little winded' at times. She has been attempting to increase her physical activity by walking around her house and outside, although unfavorable weather has been a barrier. Pollen exposure does not usually affect her unless in high amounts, which can exacerbate her breathing issues.  She has encountered issues with medication management, particularly due to a mail-order pharmacy that disrupted her usual routine, leading to a delay in receiving her nebulizer machine, which she obtained a week ago. She tried using Duoneb but experienced significant side effects, including burning of the tongue and swelling, leading to discontinuation. She has been using an albuterol  inhaler occasionally over the past few weeks, which she finds effective in managing her symptoms, using it twice a day, sometimes once a day.  She has a history of coronary artery disease and had a myocardial infarction in the past, attributed to COVID-19. She is currently on atorvastatin  10 mg twice a week and three times the next week for cholesterol management. She mentions being informed of plaque buildup in her blood vessels.  She attempted to participate in a rehabilitation program at [facility] in [place] but was told she did not need to continue after a few sessions, despite experiencing dyspnea during exercises. She has been doing exercises at home, including those taught over a year ago, and is trying to maintain her physical activity level.  Unable to afford Breo or Symbicort  due to cost  Relevant pulmonary history Pets: Dog Occupation: Operates a  [employer] Exposures: No mold, hot tub, Jacuzzi.  No feather pillows or comforter Smoking history: Never smoker Travel history: No significant travel history Relevant family history: No significant family history of  lung disease  Outpatient Encounter Medications as of 02/24/2024  Medication Sig   acetaminophen  (TYLENOL ) 325 MG tablet Take 650 mg by mouth every 6 (six) hours as needed.   Acetylcysteine (NAC) 600 MG CAPS Take 1 capsule by mouth in the morning and at bedtime.   albuterol  (PROVENTIL ) (2.5 MG/3ML) 0.083% nebulizer solution Take 3 mLs (2.5 mg total) by nebulization every 6 (six) hours as needed for wheezing or shortness of breath.   Albuterol -Budesonide  (AIRSUPRA ) 90-80 MCG/ACT AERO Inhale 2 puffs into the lungs every 6 (six) hours as needed.   ALPRAZolam  (XANAX ) 0.25 MG tablet Take 0.25 mg by mouth at bedtime.   Ascorbic Acid  (VITAMIN C) 1000 MG tablet Take 2,000 mg by mouth 2 (two) times daily.   atorvastatin  (LIPITOR ) 10 MG tablet Take 10 mg by mouth twice a week and then  alternate with a taking 3 times week by mouth   B Complex Vitamins (B COMPLEX 50 PO) Take 1 tablet by mouth daily.    benzonatate  (TESSALON ) 100 MG capsule Take 1 capsule (100 mg total) by mouth every 6 (six) hours as needed for cough.   BRILINTA  60 MG TABS tablet Take 1 tablet (60 mg total) by mouth 2 (two) times daily.   budesonide -formoterol  (SYMBICORT ) 160-4.5 MCG/ACT inhaler Inhale 2 puffs into the lungs in the morning and at bedtime.   Calcium -Magnesium-Zinc  (CAL-MAG-ZINC  PO) Take 2 tablets by mouth at bedtime.   Cholecalciferol (VITAMIN D3) 5000 units CAPS Take 5,000 Units by mouth at bedtime.   cholestyramine  (QUESTRAN ) 4 GM/DOSE powder Take 1 packet (4 g total) by mouth 3 (three) times daily with meals.   Coenzyme Q10 (CO Q 10) 100 MG CAPS Take 100 mg by mouth every evening.    diclofenac Sodium (VOLTAREN) 1 % GEL Apply 1 application. topically 4 (four) times daily.   Digestive Enzymes (ENZYME DIGEST) CAPS Take 1 capsule by mouth 2 (two) times daily with a meal.    fluconazole  (DIFLUCAN ) 150 MG tablet Take 150 mg by mouth daily as needed.   hydrocortisone  (CORTEF ) 5 MG tablet Take 5 mg by mouth See admin  instructions. TAKE 10MG  IN THE MORNING AND 5 MG IN THE AFTERNOON.   Magnesium 400 MG TABS Take 400 mg by mouth at bedtime.   metoprolol  tartrate (LOPRESSOR ) 25 MG tablet Take 25 mg tablet  by mouth as needed for anxiety   Multiple Vitamin (MULTIVITAMIN WITH MINERALS) TABS tablet Take 3 tablets by mouth daily. Pure-Coos   Nebulizers (COMPRESSOR/NEBULIZER) MISC Use as directed   nitroGLYCERIN  (NITROSTAT ) 0.4 MG SL tablet Place 1 tablet (0.4 mg total) under the tongue every 5 (five) minutes as needed.   nystatin cream (MYCOSTATIN) Apply 1 application. topically 2 (two) times daily as needed for rash.   Omega-3 Fatty Acids (OMEGA 3 PO) Take 2 capsules by mouth daily.   Polyethyl Glycol-Propyl Glycol 0.4-0.3 % SOLN Place 1 drop into both eyes 4 (four) times daily.    predniSONE  (DELTASONE ) 10 MG tablet 4 tabs for 10 days And then take 2 tabs (20mg  total) daily with breakfast for 5 days and then stop.   Probiotic Product (PROBIOTIC PO) Take 1 capsule by mouth every evening.    thyroid  (ARMOUR THYROID ) 90 MG tablet Take 45 mg by mouth in the morning and at  bedtime.   vitamin A 10000 UNIT capsule Take 10,000 Units by mouth every evening.    No facility-administered encounter medications on file as of 02/24/2024.    Physical Exam: Blood pressure (!) 145/76, pulse 61, SpO2 97%. Gen:      No acute distress HEENT:  EOMI, sclera anicteric Neck:     No masses; no thyromegaly Lungs:    Clear to auscultation bilaterally; normal respiratory effort CV:         Regular rate and rhythm; no murmurs Abd:      + bowel sounds; soft, non-tender; no palpable masses, no distension Ext:    No edema; adequate peripheral perfusion Neuro: alert and oriented x 3 Psych: normal mood and affect   Data Reviewed: Imaging: Chest x-ray 09/03/2015- mild atelectasis at the left base.   High-resolution CT 06/21/2020-no evidence of interstitial lung disease, mild centrilobular nodularity, 20 pulmonary reduce measuring 4  mm High-res CT 02/01/2021-basilar predominant peripheral groundglass, linear densities consistent with COVID-19.  Enlarged pulmonary artery Chest x-ray 04/01/2021-improvement in bilateral airspace disease Chest x-ray 07/22/2021-continued improvement of bilateral opacities. High resolution CT 02/21/2022-improvement in inflammatory changes.  There are residual mild groundglass, subpleural reticulation in the peripheries. High resolution CT 02/25/2023-stable mild interstitial changes in alternate diagnosis. I reviewed the images personally.  PFTs: 09/26/2015 FVC 3.40 [116%], FEV1 2.64 [125%], F/F 78 Normal spirometry  08/31/2021 FVC 2.76 [98%], FEV1 1.86 [88%], F/F 67, TLC 5.05 [103%], DLCO 17.32 [92%] Mild obstructive airway disease with bronchodilator response  Cardiac: Echocardiogram 02/16/2021 LVEF 60-65%, mild LVH, grade 1 diastolic dysfunction.  Normal PA systolic pressure  Sleep: Home sleep study 12/23/2021-no evidence of sleep apnea, no desats at night.  Assessment & Plan Mild asthma Mild asthma with dyspnea and exercise intolerance. Recent medication management issues due to pharmacy changes. Albuterol  inhaler effectively controls symptoms. Duoneb caused tongue burning and swelling, indicating possible allergic reaction or irritation. Lung function tests show mild asthma. Controller medication not needed due to cost and mild asthma.  - Prescribe Airsupra  inhaler as a rescue inhaler, containing albuterol  and a steroid.  Not on controller medication due to cost and stability of symptoms - Prescribe albuterol  for nebulizer use. - Encourage aerobic exercise such as walking, treadmill, or stationary cycling.   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  This is new from March 2022 and is consistent with post-COVID changes.  Later CT scan reviewed with stable changes. Off oxygen during daytime.  She can also stop oxygen at  night as written sleep study does not show nocturnal desats.  Daytime fatigue No evidence of sleep apnea on home sleep study.  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  Echo reviewed with no evidence of pulmonary hypertension  Plan/Recommendations: Nebulizers Airsupra  Exercise therapy  Follow-up in 6 months   Lonna Coder MD [facility] 02/24/2024, 3:08 PM  CC: Erskine Neptune, FNP

## 2024-04-06 IMAGING — DX DG CHEST 2V
2 series · 2 of 2 positions shown · non-contrast
Comparison: 04/01/2021, 01/03/2021

CLINICAL DATA: 70-year-old female with a history of bronchitis

EXAM:
CHEST - 2 VIEW

[chest pa]
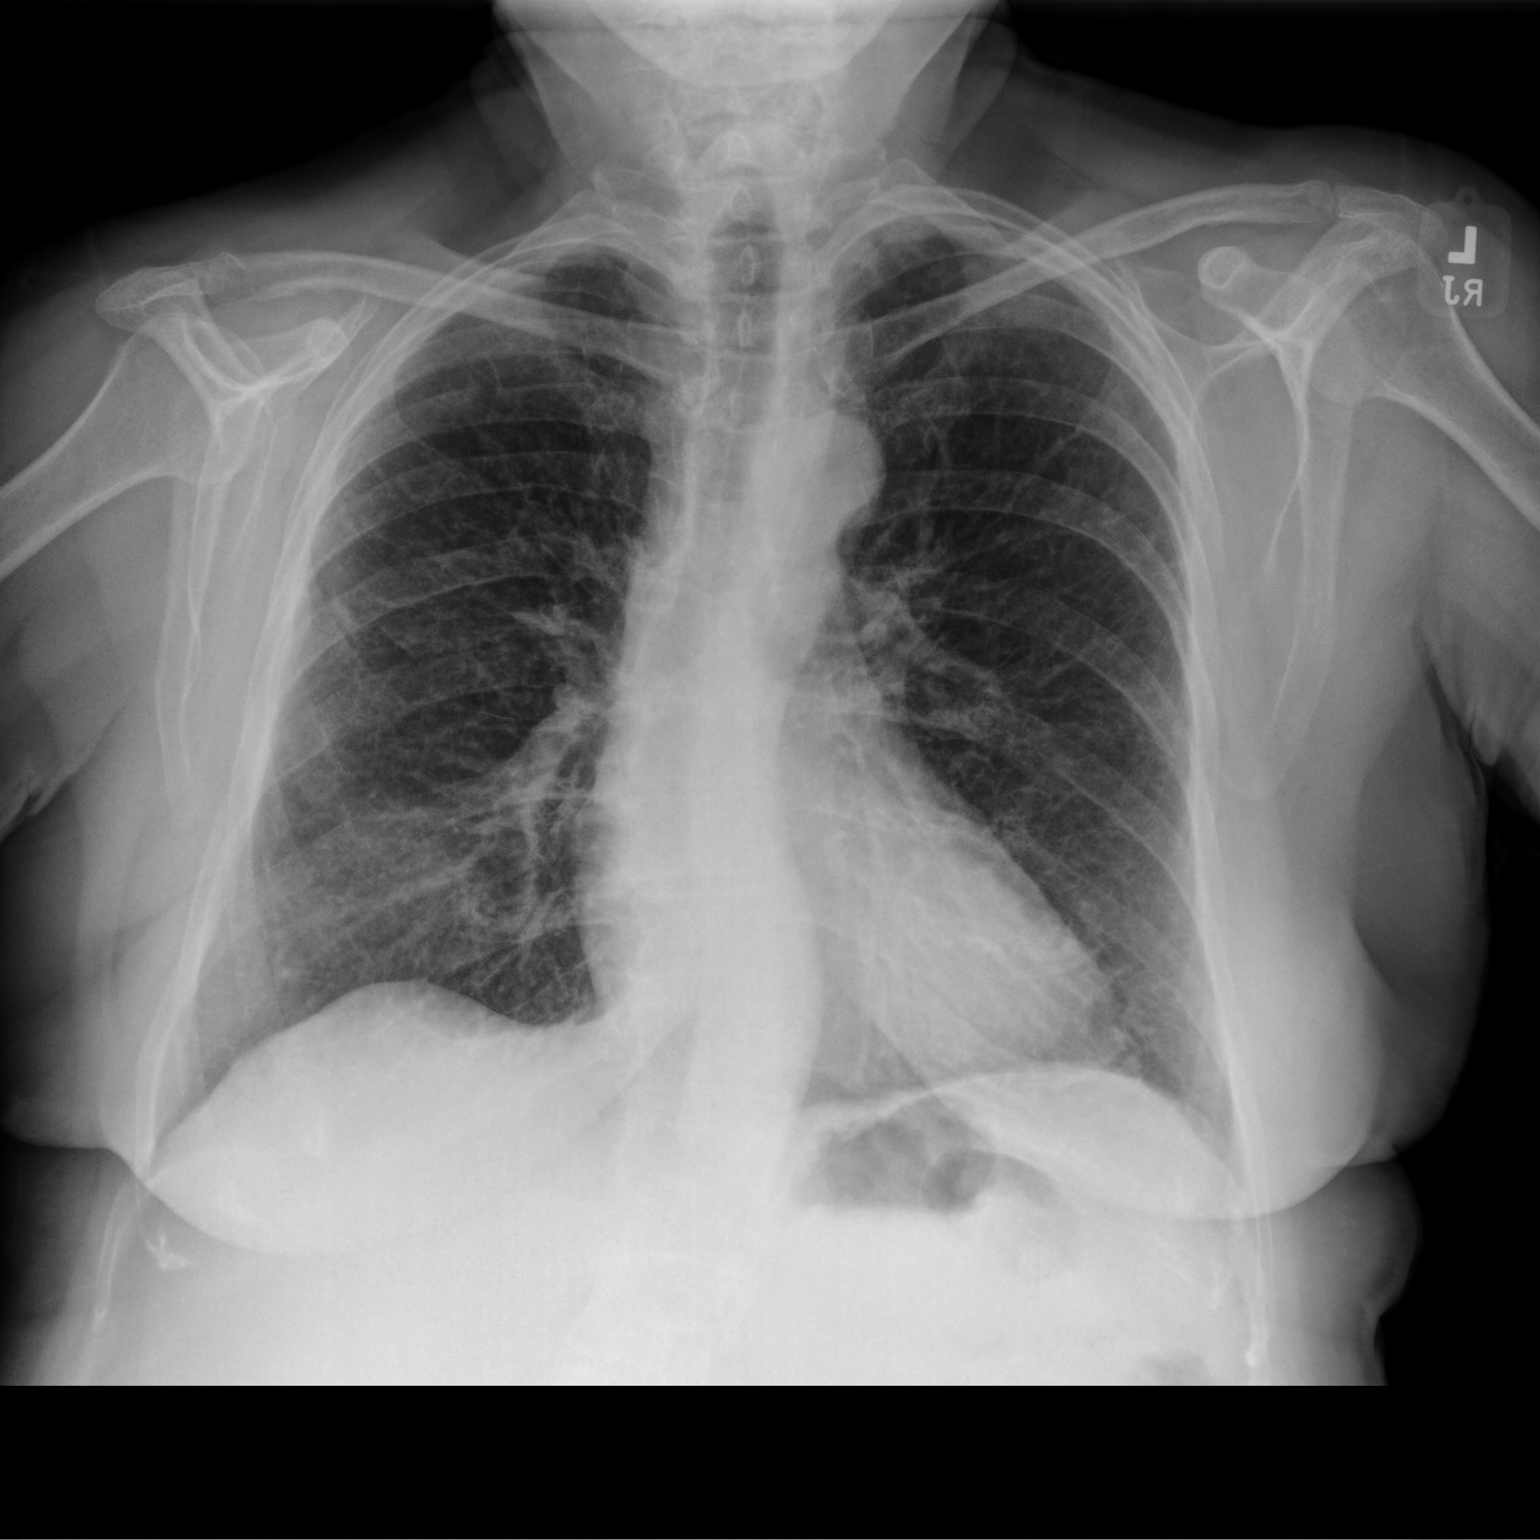

[chest lat]
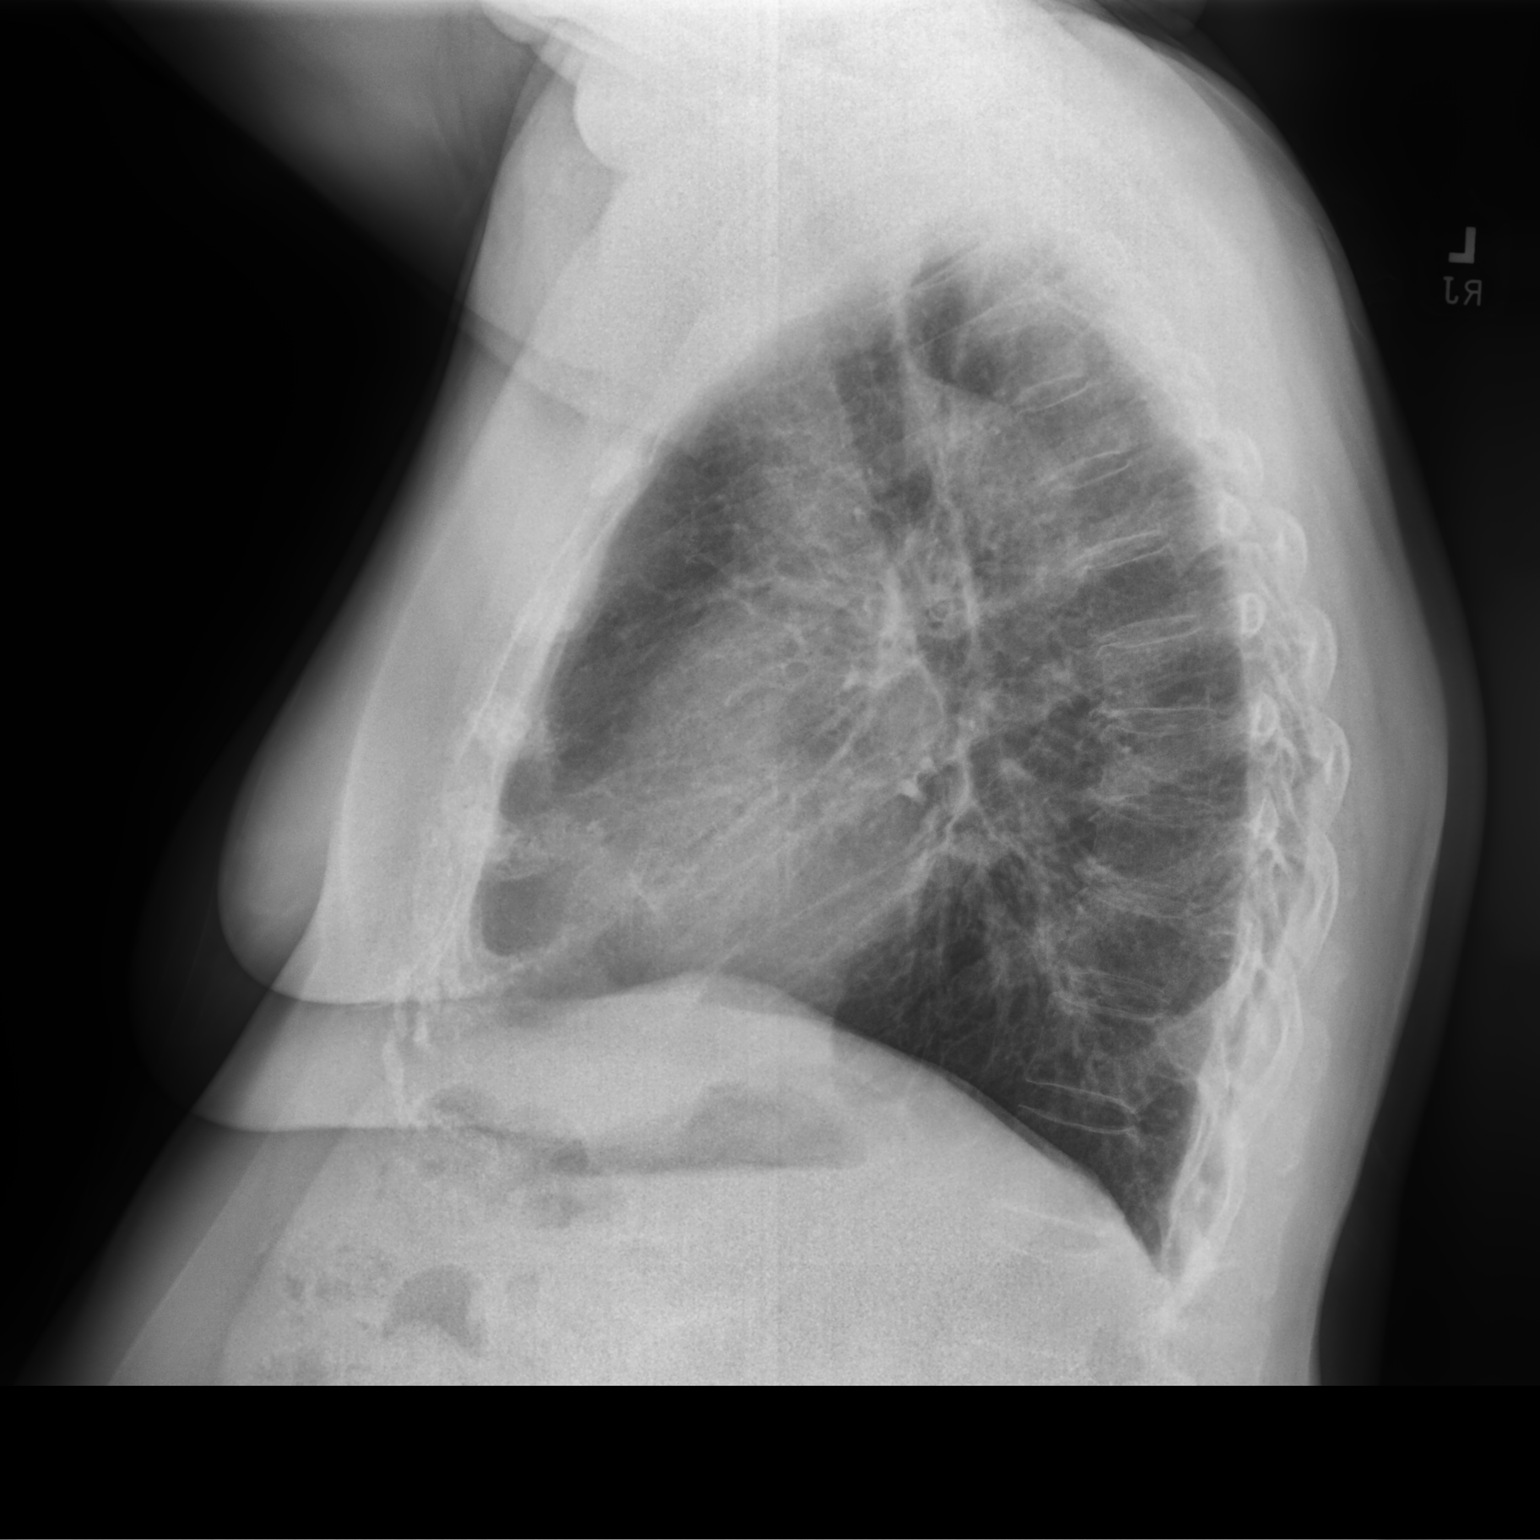

[2 of 2 positions shown; findings below may reference images not displayed]

FINDINGS: Cardiomediastinal silhouette unchanged in size and contour.

No interlobular septal thickening.  No central vascular congestion.

Continued improved appearance of bilateral reticulonodular opacities
at the periphery of the lungs when compared to 04/01/2021,
significantly improved from 01/03/2021.

No pneumothorax or pleural effusion.

No displaced fracture.  Degenerative changes of the spine
IMPRESSION: Continued improvement in bilateral peripheral reticulonodular
opacities, likely sequela of prior infection/inflammation, with
chronic bronchial wall thickening.
